# Patient Record
Sex: Male | Born: 1935 | Race: White | Hispanic: No | Smoking: Former smoker
Health system: Southern US, Community
[De-identification: ages and names within clinical notes are randomized; demographics above are authoritative.]

## PROBLEM LIST (undated history)

## (undated) DIAGNOSIS — G709 Myoneural disorder, unspecified: Secondary | ICD-10-CM

## (undated) DIAGNOSIS — N400 Enlarged prostate without lower urinary tract symptoms: Secondary | ICD-10-CM

## (undated) DIAGNOSIS — N183 Chronic kidney disease, stage 3 unspecified: Secondary | ICD-10-CM

## (undated) DIAGNOSIS — C801 Malignant (primary) neoplasm, unspecified: Secondary | ICD-10-CM

## (undated) DIAGNOSIS — Z87442 Personal history of urinary calculi: Secondary | ICD-10-CM

## (undated) DIAGNOSIS — I10 Essential (primary) hypertension: Secondary | ICD-10-CM

## (undated) DIAGNOSIS — J189 Pneumonia, unspecified organism: Secondary | ICD-10-CM

## (undated) DIAGNOSIS — N200 Calculus of kidney: Secondary | ICD-10-CM

## (undated) DIAGNOSIS — I2510 Atherosclerotic heart disease of native coronary artery without angina pectoris: Secondary | ICD-10-CM

## (undated) DIAGNOSIS — I2699 Other pulmonary embolism without acute cor pulmonale: Secondary | ICD-10-CM

## (undated) DIAGNOSIS — M1990 Unspecified osteoarthritis, unspecified site: Secondary | ICD-10-CM

## (undated) DIAGNOSIS — E785 Hyperlipidemia, unspecified: Secondary | ICD-10-CM

## (undated) HISTORY — PX: EYE SURGERY: SHX253

## (undated) HISTORY — PX: CORONARY ANGIOPLASTY: SHX604

## (undated) HISTORY — DX: Atherosclerotic heart disease of native coronary artery without angina pectoris: I25.10

## (undated) HISTORY — PX: CATARACT EXTRACTION: SUR2

## (undated) HISTORY — PX: JOINT REPLACEMENT: SHX530

## (undated) HISTORY — DX: Benign prostatic hyperplasia without lower urinary tract symptoms: N40.0

## (undated) HISTORY — DX: Hyperlipidemia, unspecified: E78.5

---
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
## 1999-03-09 HISTORY — PX: HERNIA REPAIR: SHX51

## 2002-04-08 HISTORY — PX: TRANSURETHRAL RESECTION OF PROSTATE: SHX73

## 2003-02-18 ENCOUNTER — Ambulatory Visit (HOSPITAL_COMMUNITY): Admission: RE | Admit: 2003-02-18 | Discharge: 2003-02-18 | Payer: Self-pay | Admitting: Urology

## 2003-02-22 ENCOUNTER — Ambulatory Visit (HOSPITAL_COMMUNITY): Admission: RE | Admit: 2003-02-22 | Discharge: 2003-02-22 | Payer: Self-pay | Admitting: Urology

## 2003-03-10 ENCOUNTER — Encounter (INDEPENDENT_AMBULATORY_CARE_PROVIDER_SITE_OTHER): Payer: Self-pay

## 2003-03-10 ENCOUNTER — Inpatient Hospital Stay (HOSPITAL_COMMUNITY): Admission: RE | Admit: 2003-03-10 | Discharge: 2003-03-12 | Payer: Self-pay | Admitting: Urology

## 2003-04-09 HISTORY — PX: OTHER SURGICAL HISTORY: SHX169

## 2003-04-21 ENCOUNTER — Inpatient Hospital Stay (HOSPITAL_COMMUNITY): Admission: RE | Admit: 2003-04-21 | Discharge: 2003-04-24 | Payer: Self-pay | Admitting: Urology

## 2003-04-21 ENCOUNTER — Encounter (INDEPENDENT_AMBULATORY_CARE_PROVIDER_SITE_OTHER): Payer: Self-pay | Admitting: Specialist

## 2004-04-08 HISTORY — PX: CARDIAC CATHETERIZATION: SHX172

## 2005-03-25 ENCOUNTER — Ambulatory Visit: Payer: Self-pay | Admitting: Cardiology

## 2005-03-27 ENCOUNTER — Ambulatory Visit (HOSPITAL_COMMUNITY): Admission: EM | Admit: 2005-03-27 | Discharge: 2005-03-28 | Payer: Self-pay | Admitting: Cardiology

## 2005-03-27 ENCOUNTER — Ambulatory Visit: Payer: Self-pay | Admitting: Cardiovascular Disease

## 2005-03-27 ENCOUNTER — Inpatient Hospital Stay (HOSPITAL_BASED_OUTPATIENT_CLINIC_OR_DEPARTMENT_OTHER): Admission: RE | Admit: 2005-03-27 | Discharge: 2005-03-27 | Payer: Self-pay | Admitting: Cardiovascular Disease

## 2005-04-15 ENCOUNTER — Ambulatory Visit: Payer: Self-pay | Admitting: Cardiology

## 2005-10-07 ENCOUNTER — Ambulatory Visit: Payer: Self-pay

## 2005-10-15 ENCOUNTER — Ambulatory Visit: Payer: Self-pay | Admitting: Cardiology

## 2005-10-31 ENCOUNTER — Ambulatory Visit: Payer: Self-pay

## 2005-11-25 ENCOUNTER — Inpatient Hospital Stay (HOSPITAL_COMMUNITY): Admission: RE | Admit: 2005-11-25 | Discharge: 2005-11-28 | Payer: Self-pay | Admitting: Orthopedic Surgery

## 2005-11-25 HISTORY — PX: OTHER SURGICAL HISTORY: SHX169

## 2006-03-03 ENCOUNTER — Inpatient Hospital Stay (HOSPITAL_COMMUNITY): Admission: RE | Admit: 2006-03-03 | Discharge: 2006-03-05 | Payer: Self-pay | Admitting: Orthopedic Surgery

## 2006-10-29 ENCOUNTER — Ambulatory Visit: Payer: Self-pay | Admitting: Cardiology

## 2007-10-14 ENCOUNTER — Ambulatory Visit: Payer: Self-pay | Admitting: Cardiology

## 2007-12-23 ENCOUNTER — Encounter: Payer: Self-pay | Admitting: Cardiology

## 2008-04-08 HISTORY — PX: HIP ARTHROPLASTY: SHX981

## 2008-08-22 ENCOUNTER — Encounter (INDEPENDENT_AMBULATORY_CARE_PROVIDER_SITE_OTHER): Payer: Self-pay | Admitting: *Deleted

## 2008-10-12 DIAGNOSIS — Z9889 Other specified postprocedural states: Secondary | ICD-10-CM

## 2008-10-12 DIAGNOSIS — Z9089 Acquired absence of other organs: Secondary | ICD-10-CM

## 2008-10-12 DIAGNOSIS — Z9849 Cataract extraction status, unspecified eye: Secondary | ICD-10-CM

## 2008-10-20 ENCOUNTER — Ambulatory Visit: Payer: Self-pay | Admitting: Cardiology

## 2008-10-20 DIAGNOSIS — E7800 Pure hypercholesterolemia, unspecified: Secondary | ICD-10-CM

## 2008-10-24 ENCOUNTER — Telehealth (INDEPENDENT_AMBULATORY_CARE_PROVIDER_SITE_OTHER): Payer: Self-pay | Admitting: *Deleted

## 2008-10-25 ENCOUNTER — Encounter: Payer: Self-pay | Admitting: Cardiology

## 2008-10-25 ENCOUNTER — Ambulatory Visit: Payer: Self-pay

## 2008-11-09 ENCOUNTER — Encounter: Payer: Self-pay | Admitting: Cardiology

## 2008-11-21 ENCOUNTER — Inpatient Hospital Stay (HOSPITAL_COMMUNITY): Admission: RE | Admit: 2008-11-21 | Discharge: 2008-11-23 | Payer: Self-pay | Admitting: Orthopedic Surgery

## 2009-03-09 ENCOUNTER — Encounter: Payer: Self-pay | Admitting: Cardiology

## 2009-11-03 ENCOUNTER — Ambulatory Visit: Payer: Self-pay | Admitting: Cardiology

## 2009-12-14 ENCOUNTER — Encounter: Payer: Self-pay | Admitting: Cardiology

## 2009-12-22 ENCOUNTER — Telehealth: Payer: Self-pay | Admitting: Cardiology

## 2010-05-08 NOTE — Progress Notes (Signed)
Summary: test result   Phone Note Call from Patient Call back at Home Phone [phone]   Caller: Patient Reason for Call: Talk to Nurse, Lab or Test Results Details for Reason: blood test on 9/8. Initial call taken by: Lorne Skeens,  December 22, 2009 10:27 AM  Follow-up for Phone Call        12/22/09--1030am--pt calling about last L/L drawn 9/11--would like to know if dr Jens Som wants any med changes as triglycerides are now elevated (195)--advised pt i would send results to dr Alphonsa Gin and if any changes are to be made he will notify pt--pt also states has not been exercising as much due to recent death of wife--nt Follow-up by: Ledon Snare, RN,  December 22, 2009 10:44 AM     Appended Document: test result continue same  Appended Document: test result LMTCB./CY  Appended Document: test result pt aware no changes at this time./cy

## 2010-05-08 NOTE — Assessment & Plan Note (Signed)
Summary: YEARLY/SL  Medications Added CRESTOR 40 MG TABS (ROSUVASTATIN CALCIUM) Take one tablet by mouth daily.        Primary Provider:  Dr. Welton Flakes   History of Present Illness: The patient is a pleasant gentleman who has a history of PCI of his LAD in December 2006 with a bare-metal stent. His most recent Myoview was performed in July 2010.  At that time, he was found to have no scar or ischemia.  His ejection fraction was 59%. The patient has had previous abdominal ultrasound on October 31, 2005, that showed no aneurysm. I last saw him in July of 2010. Since then he denies any dyspnea, chest pain, palpitations or syncope no pedal edema. There is no claudication. Note last LDL was 98 in December of 7425.  Current Medications (verified): 1)  Aspirin 81 Mg Tbec (Aspirin) .... Take One Tablet By Mouth Daily 2)  Crestor 20 Mg Tabs (Rosuvastatin Calcium) .Marland Kitchen.. 1 Tab Once Daily 3)  Toprol Xl 25 Mg Xr24h-Tab (Metoprolol Succinate) .... Once Daily 4)  Multivitamins   Tabs (Multiple Vitamin) .... Once Daily 5)  Lisinopril 5 Mg Tabs (Lisinopril) .Marland Kitchen.. 1 Tab Once Daily 6)  Coq10 100 Mg Caps (Coenzyme Q10) .Marland Kitchen.. 1 Cap Once Daily 7)  Tylenol 325 Mg Tabs (Acetaminophen) .... As Needed 8)  Trigosamine .Marland Kitchen.. 1-2 Tabs Once Daily  Allergies: No Known Drug Allergies  Past History:  Past Medical History: Reviewed history from 10/20/2008 and no changes required. CAD with hx of 2 sstents performed Dec.20,2006 hyperlipidemia Benign prostatic hypertrophy  Past Surgical History: Reviewed history from 10/12/2008 and no changes required.   Appendectomy in 1938.   Herniorrhaphy December 2000.   Cataract removal January 2001 and 2004.   TURP in 2004.   Left nephrectomy January 2005.   Left total knee arthroplasty November 25, 2005.  Social History: Reviewed history from 10/12/2008 and no changes required. Retired Scientist, clinical (histocompatibility and immunogenetics). Married  lives with his wife has two children,son 41 yrs  and  daugther 43 yrs both  alive   Tobacco Use - No.  Alcohol Use - yes one or two beers Drug Use - no  Review of Systems       Arthralgias but no fevers or chills, productive cough, hemoptysis, dysphasia, odynophagia, melena, hematochezia, dysuria, hematuria, rash, seizure activity, orthopnea, PND, pedal edema, claudication. Remaining systems are negative.   Vital Signs:  Patient profile:   77 year old male Height:      71 inches Weight:      189 pounds BMI:     26.46 Pulse rate:   62 / minute Resp:     12 per minute BP sitting:   118 / 72  (left arm)  Vitals Entered By: Kem Parkinson (November 03, 2009 9:47 AM)  Physical Exam  General:  Well-developed well-nourished in no acute distress.  Skin is warm and dry.  HEENT is normal.  Neck is supple. No thyromegaly.  Chest is clear to auscultation with normal expansion.  Cardiovascular exam is regular rate and rhythm.  Abdominal exam nontender or distended. No masses palpated. Extremities show no edema. neuro grossly intact    EKG  Procedure date:  11/03/2009  Findings:      Sinus rhythm at a rate of 62. No ST changes.  Impression & Recommendations:  Problem # 1:  HYPERCHOLESTEROLEMIA-PURE (ICD-272.0) Recent LDL not at goal. Increase Crestor 40 mg p.o. daily. Check lipids and liver in 6 weeks. His updated medication list  for this problem includes:    Crestor 40 Mg Tabs (Rosuvastatin calcium) .Marland Kitchen... Take one tablet by mouth daily.  His updated medication list for this problem includes:    Crestor 20 Mg Tabs (Rosuvastatin calcium) .Marland Kitchen... 1 tab once daily  Problem # 2:  CORONARY ATHEROSCLEROSIS NATIVE CORONARY ARTERY (ICD-414.01)  Continue aspirin, beta blocker, ACE inhibitor and statin. His updated medication list for this problem includes:    Aspirin 81 Mg Tbec (Aspirin) .Marland Kitchen... Take one tablet by mouth daily    Toprol Xl 25 Mg Xr24h-tab (Metoprolol succinate) ..... Once daily    Lisinopril 5 Mg Tabs (Lisinopril) .Marland Kitchen... 1  tab once daily  His updated medication list for this problem includes:    Aspirin 81 Mg Tbec (Aspirin) .Marland Kitchen... Take one tablet by mouth daily    Toprol Xl 25 Mg Xr24h-tab (Metoprolol succinate) ..... Once daily    Lisinopril 5 Mg Tabs (Lisinopril) .Marland Kitchen... 1 tab once daily  Patient Instructions: 1)  Your physician recommends that you schedule a follow-up appointment in: ONE YEAR 2)  Your physician recommends that you return for lab work in:6 WEEKS-WEEKS OF 12-12-09 3)  Your physician has recommended you make the following change in your medication: INCREASE CRESTOR 40MG  ONCE DAILY Prescriptions: CRESTOR 40 MG TABS (ROSUVASTATIN CALCIUM) Take one tablet by mouth daily.  #30 x 12   Entered by:   Deliah Goody, RN   Authorized by:   Ferman Hamming, MD, [facility]   Signed by:   Deliah Goody, RN on 11/03/2009   Method used:   Electronically to        [facility]  [id number] (retail)       [address]       [place], [place]  [address]       Ph: [phone] or [phone]       Fax: [phone]   RxID:   [id number]

## 2010-07-14 LAB — CBC
HCT: 25.6 % — ABNORMAL LOW (ref 39.0–52.0)
HCT: 28.6 % — ABNORMAL LOW (ref 39.0–52.0)
HCT: 40.9 % (ref 39.0–52.0)
Hemoglobin: 8.8 g/dL — ABNORMAL LOW (ref 13.0–17.0)
Hemoglobin: 9.7 g/dL — ABNORMAL LOW (ref 13.0–17.0)
Hemoglobin: 14 g/dL (ref 13.0–17.0)
MCHC: 33.9 g/dL (ref 30.0–36.0)
MCHC: 34.4 g/dL (ref 30.0–36.0)
MCHC: 34.2 g/dL (ref 30.0–36.0)
MCV: 95.3 fL (ref 78.0–100.0)
MCV: 97.6 fL (ref 78.0–100.0)
MCV: 96.4 fL (ref 78.0–100.0)
Platelets: 129 10*3/uL — ABNORMAL LOW (ref 150–400)
Platelets: 139 10*3/uL — ABNORMAL LOW (ref 150–400)
Platelets: 231 10*3/uL (ref 150–400)
RBC: 2.63 MIL/uL — ABNORMAL LOW (ref 4.22–5.81)
RBC: 3 MIL/uL — ABNORMAL LOW (ref 4.22–5.81)
RBC: 4.24 MIL/uL (ref 4.22–5.81)
RDW: 13.3 % (ref 11.5–15.5)
RDW: 13.5 % (ref 11.5–15.5)
RDW: 13.1 % (ref 11.5–15.5)
WBC: 10.7 10*3/uL — ABNORMAL HIGH (ref 4.0–10.5)
WBC: 8.3 10*3/uL (ref 4.0–10.5)
WBC: 9.2 10*3/uL (ref 4.0–10.5)

## 2010-07-14 LAB — COMPREHENSIVE METABOLIC PANEL
ALT: 15 U/L (ref 0–53)
AST: 24 U/L (ref 0–37)
Albumin: 4.3 g/dL (ref 3.5–5.2)
Alkaline Phosphatase: 51 U/L (ref 39–117)
BUN: 21 mg/dL (ref 6–23)
CO2: 28 mEq/L (ref 19–32)
Calcium: 10 mg/dL (ref 8.4–10.5)
Chloride: 105 mEq/L (ref 96–112)
Creatinine, Ser: 1.21 mg/dL (ref 0.4–1.5)
GFR calc Af Amer: >60 mL/min (ref >60)
GFR calc non Af Amer: 59 mL/min — ABNORMAL LOW (ref >60)
Glucose, Bld: 87 mg/dL (ref 70–99)
Potassium: 4.8 mEq/L (ref 3.5–5.1)
Sodium: 139 mEq/L (ref 135–145)
Total Bilirubin: 1.3 mg/dL — ABNORMAL HIGH (ref 0.3–1.2)
Total Protein: 6.9 g/dL (ref 6.0–8.3)

## 2010-07-14 LAB — URINALYSIS, ROUTINE W REFLEX MICROSCOPIC
Bilirubin Urine: NEGATIVE
Glucose, UA: NEGATIVE mg/dL
Hgb urine dipstick: NEGATIVE
Ketones, ur: NEGATIVE mg/dL
Nitrite: NEGATIVE
Protein, ur: NEGATIVE mg/dL
Specific Gravity, Urine: 1.02 (ref 1.005–1.030)
Urobilinogen, UA: 0.2 mg/dL (ref 0.0–1.0)
pH: 5.5 (ref 5.0–8.0)

## 2010-07-14 LAB — DIFFERENTIAL
Basophils Absolute: 0.1 10*3/uL (ref 0.0–0.1)
Basophils Relative: 1 % (ref 0–1)
Eosinophils Absolute: 0.2 10*3/uL (ref 0.0–0.7)
Eosinophils Relative: 2 % (ref 0–5)
Lymphocytes Relative: 24 % (ref 12–46)
Lymphs Abs: 2.2 10*3/uL (ref 0.7–4.0)
Monocytes Absolute: 0.9 10*3/uL (ref 0.1–1.0)
Monocytes Relative: 9 % (ref 3–12)
Neutro Abs: 5.9 10*3/uL (ref 1.7–7.7)
Neutrophils Relative %: 64 % (ref 43–77)

## 2010-07-14 LAB — BASIC METABOLIC PANEL
BUN: 12 mg/dL (ref 6–23)
BUN: 13 mg/dL (ref 6–23)
CO2: 28 mEq/L (ref 19–32)
CO2: 29 mEq/L (ref 19–32)
Calcium: 8.2 mg/dL — ABNORMAL LOW (ref 8.4–10.5)
Calcium: 8.5 mg/dL (ref 8.4–10.5)
Chloride: 103 mEq/L (ref 96–112)
Chloride: 103 mEq/L (ref 96–112)
Creatinine, Ser: 1.28 mg/dL (ref 0.4–1.5)
Creatinine, Ser: 1.38 mg/dL (ref 0.4–1.5)
GFR calc Af Amer: >60 mL/min (ref >60)
GFR calc Af Amer: >60 mL/min (ref >60)
GFR calc non Af Amer: 50 mL/min — ABNORMAL LOW (ref >60)
GFR calc non Af Amer: 55 mL/min — ABNORMAL LOW (ref >60)
Glucose, Bld: 119 mg/dL — ABNORMAL HIGH (ref 70–99)
Glucose, Bld: 132 mg/dL — ABNORMAL HIGH (ref 70–99)
Potassium: 4.2 mEq/L (ref 3.5–5.1)
Potassium: 4.5 mEq/L (ref 3.5–5.1)
Sodium: 136 mEq/L (ref 135–145)
Sodium: 136 mEq/L (ref 135–145)

## 2010-07-14 LAB — POCT I-STAT 4, (NA,K, GLUC, HGB,HCT)
Glucose, Bld: 124 mg/dL — ABNORMAL HIGH (ref 70–99)
HCT: 31 % — ABNORMAL LOW (ref 39.0–52.0)
Hemoglobin: 10.5 g/dL — ABNORMAL LOW (ref 13.0–17.0)
Potassium: 4.2 mEq/L (ref 3.5–5.1)
Sodium: 135 mEq/L (ref 135–145)

## 2010-07-14 LAB — APTT: aPTT: 30 seconds (ref 24–37)

## 2010-07-14 LAB — CROSSMATCH
ABO/RH(D): O NEG
Antibody Screen: NEGATIVE

## 2010-07-14 LAB — URINE CULTURE: Colony Count: 2000

## 2010-07-14 LAB — PROTIME-INR
INR: 0.9 (ref 0.00–1.49)
Prothrombin Time: 12.2 seconds (ref 11.6–15.2)

## 2010-07-31 ENCOUNTER — Telehealth: Payer: Self-pay | Admitting: Cardiology

## 2010-07-31 ENCOUNTER — Encounter: Payer: Self-pay | Admitting: Cardiology

## 2010-07-31 NOTE — Telephone Encounter (Signed)
error 

## 2010-07-31 NOTE — Telephone Encounter (Signed)
Spoke with pt, okay given for pt to take fish oil Deliah Goody

## 2010-08-21 NOTE — Op Note (Signed)
NAMEPHILLIPPE, Ricardo Soto         ACCOUNT NO.:  [id number]   MEDICAL RECORD NO.:  [id number]          PATIENT TYPE:  INP   LOCATION:  5006                         FACILITY:  [facility]   PHYSICIAN:  Mila Homer. Sherlean Foot, M.D. DATE OF BIRTH:  20-Oct-1931   DATE OF PROCEDURE:  11/21/2008  DATE OF DISCHARGE:                               OPERATIVE REPORT   SURGEON:  Mila Homer. Sherlean Foot, MD   ASSISTANT:  1. Altamese Cabal, PA-C  2. Skip Mayer, PA-C   ANESTHESIA:  General.   PREOPERATIVE DIAGNOSIS:  Left hip osteoarthritis.   POSTOPERATIVE DIAGNOSIS:  Left hip osteoarthritis.   PROCEDURE:  Left total hip arthroplasty.   INDICATIONS FOR PROCEDURE:  The patient is a 77 year old white male who  had failed conservative measures for osteoarthritis of the left hip.  Informed consent was obtained.   DESCRIPTION OF PROCEDURE:  The patient was laid supine and administered  general anesthesia and Foley catheter was placed.  He was then placed in  the right down left up lateral decubitus position.  Left knee was  prepped and draped in usual sterile fashion.  Curvilinear incision was  made over the trochanter with a #10 blade approximately 6 inches in  length.  I then cut the fascia lata with the cautery along the same in  incision.  I then placed a Charnley retractor in place.  I then elevated  a sleeve of vastus lateralis medius and gluteus medius and gluteus  minimus, bisecting the lateralis medius, and tagging with stay sutures.  I then placed a Hohmann retractor protecting these anteriorly over the  front edge of the acetabulum.  I then performed an anterior hip  capsulectomy with cautery and the pickups.  At this point, I then went  in the external rotation with the hip and marked out the neck cut.  I  made the neck cut with reciprocating saw.  I removed that head and neck  segment.  I then switched sides with my PA.  I went to the front side of  the patient.  I then circumferentially cleaned  out the acetabulum labrum  with Hohmann retractors in place.  I then reamed sequentially up to 58  and put in a 60-mm fiber mesh cup with 3 holes and drilled one drill  hole just for some added stability into the bone with a little saw.  I  then snapped in a 32-mm liner, standard.  I then went to the backside of  the table and flexed the leg into a sterile pouch.  Hip flex and knee  flex to 90 degrees.  I placed a  Mueller retractor and taken the soft tissues and then used a canal  finder and then reamed up to 15 mm, versed 15 mm and then trialed in 0,  gave me excellent stability.  I then removed the trial components and  then tamped down a fully porous coated stem, placed a 32, +0 head on a  clean Morse taper, took it through aggressive range of motion and he did  very very well.  I then closed the lateralis medius minimus sleeve  through drill  holes and oversewn with a  #2 Tevdek.  I then closed the fascia lata with #1 Vicryl running  sutures.  I then closed the soft tissues with buried 0 Vicryl sutures.  I then ran a 2-0 Vicryl subcuticular and skin staples.  I then dressed  with a Mepilex dressing.  EBL 300 mL.  Complications none.  Drains none.           ______________________________  Mila Homer. Sherlean Foot, M.D.     SDL/MEDQ  D:  11/21/2008  T:  11/22/2008  Job:  [id number]

## 2010-08-21 NOTE — Assessment & Plan Note (Signed)
[facility]                            CARDIOLOGY OFFICE NOTE   Ricardo Soto, Ricardo Soto                MRN:          [id number]  DATE:10/14/2007                            DOB:          24-Jun-1931    HISTORY OF PRESENT ILLNESS:  The patient is a pleasant gentleman who has  a history of PCI of his LAD in December 2006 with a bare-metal stent.  His most recent Myoview was performed on October 07, 2005.  At that time, he  was found to have no scar or ischemia.  His ejection fraction was 55%.  The patient has had previous abdominal ultrasound on October 31, 2005, that  showed no aneurysm.  Since I last saw him, he denies any dyspnea, chest  pain, palpitations, or syncope.  However, he does complain of weakness  in his lower extremities and cramping.  This is temporarily related to  starting Lipitor.  Lipitor was substituted for Vytorin.  His Vytorin  apparently was causing loose bowels.   MEDICATIONS:  1. Aspirin 81 mg p.o. daily.  2. Toprol 25 mg p.o. daily.  3. Multivitamin.  4. Lipitor daily 40 mg p.o. daily.   PHYSICAL EXAMINATION:  VITAL SIGNS:  Blood pressure of 120/70s and pulse  is 61.  He weighs 193 pounds.  HEENT:  Normal.  NECK:  Supple.  CHEST:  Clear.  CARDIOVASCULAR:  Regular rate and rhythm.  ABDOMEN:  No pulsatile mass.  No bruit.  EXTREMITIES:  No edema.  Note, he does have 2+ posterior tibial pulses  bilaterally.   His electrocardiogram shows a sinus rhythm at a rate of 61.  No ST  changes noted.   DIAGNOSES:  1. Coronary artery disease status post bare-metal stent to the left      anterior descending artery in December 2006 - He will continue his      aspirin and beta-blocker.  Note, he has not had chest pain or      shortness of breath.  He will continue his risk factor modification      including diet and exercise.  He does not smoke.  In 1 year when he      returns, he will most likely repeat his Myoview.  2. Hyperlipidemia  - The patient is complaining of bilateral lower      extremity pain.  This sounds to be potentially myalgias from his      Lipitor.  I will discontinue that medication for 4 weeks.  If his      symptoms improve, we will then try Crestor 20 mg take daily, and      check lipids and liver in 6 weeks.  If his symptoms do not improve      off the Lipitor, then we will check ABIs to screen for peripheral      vascular disease, although he does have good pulses on      examination.  3. Remote history of tobacco use.     Madolyn Frieze Jens Som, MD, [facility]  Electronically Signed    BSC/MedQ  DD: 10/14/2007  DT: 10/15/2007  Job #: [id number]  cc:   Dr.Jaber Crista Luria

## 2010-08-21 NOTE — Assessment & Plan Note (Signed)
 [facility]                            CARDIOLOGY OFFICE NOTE   Ricardo, Soto                MRN:          [id number]  DATE:10/29/2006                            DOB:          [birth year]    The patient is a very pleasant gentleman who is status post bare metal  stent to his LAD in December of 2006.  A followup nuclear study was  performed on October 07, 2005.  At that time, he was found to have no sign  of scar or ischemia, and his ejection fraction was 55%.  Also of note  that he had an abdominal ultrasound on October 31, 2005 that showed no  aneurysm.  Since I last saw him, he is doing extremely well.  There is  no dyspnea on exertion, orthopnea, PND, palpitations, syncope, or chest  pain.  He did have bilateral knee replacements.   MEDICATIONS:  1. Aspirin 81 mg p.o. daily.  2. Plavix 75 mg p.o. daily.  3. Vytorin 10/40 daily.  4. Metoprolol ER 25 mg p.o. daily.  5. Multivitamin.   PHYSICAL EXAM:  Blood pressure 138/80, pulse 75.  He weighs 193 pounds.  HEENT:  Normal.  NECK:  Supple with no bruits.  CHEST:  Clear.  CARDIOVASCULAR:  Regular rate and rhythm.  ABDOMEN:  Benign.  EXTREMITIES:  No edema.   ELECTROCARDIOGRAM:  Shows his sinus rhythm at a rate of 70.  There are  no ST changes noted.   DIAGNOSES:  1. Coronary artery disease status post non-drug-eluting stent to the      left anterior descending in December 2006 - we will continue with      medical therapy including his aspirin, beta blocker, and Vytorin.      I will discontinue his Plavix as it has now been greater than 1      year since his intervention.  Note, he has had no chest pain or      shortness of breath.  2. Hyperlipidemia - we will have his most recent lipids and liver      forwarded to [place] from Dr. Milta Deiters office.  Our goal LDL should be      less than 70.  3. Remote tobacco abuse.   I will see him back in 1 year or sooner if necessary.     Ricardo Soto  Ricardo Som, MD, [facility]  Electronically Signed    BSC/MedQ  DD: 10/29/2006  DT: 10/29/2006  Job #: [id number]   cc:   Tomasa Rand, MD

## 2010-08-24 NOTE — Assessment & Plan Note (Signed)
[facility]                              CARDIOLOGY OFFICE NOTE   Ricardo Soto, Ricardo Soto                MRN:          [id number]  DATE:10/15/2005                            DOB:          03-09-1932    REFERRING PHYSICIAN:  BRIAN S Soto   HISTORY OF PRESENT ILLNESS:  The patient is a pleasant 77 year old male who  has previously been followed by Dr. Andee Soto.  A previous nuclear study showed  apical ischemia.  He underwent cardiac catheterization on March 27, 2005  and was found to have a subtotaled LAD after the take-off of the first  diagonal.  There were right to left septal collaterals.  The patient  subsequently underwent PCI of his LAD with a non drug-eluting stent.  He did  have a follow up nuclear study on October 07, 2005 that showed normal left  ventricular function with an ejection fraction of 55%.  No perfusion  abnormalities.  Since then, he has done well.  There is no dyspnea on  exertion, orthopnea, PND, pedal edema, palpitations, presyncope, syncope or  exertional chest pain.  He does complain of pain in his knees due to  osteoarthritis, and may require knee replacement.   MEDICATIONS:  1.  Vytorin 10/20 q.h.s.  2.  Toprol 25 mg p.o. daily.  3.  Aspirin 81 mg p.o. daily.  4.  Plavix 75 mg p.o. daily.   PHYSICAL EXAMINATION:  VITAL SIGNS:  Blood pressure of 130/73, pulse 64.  NECK:  Supple.  No bruits.  CHEST:  Clear.  CARDIAC:  Regular rate and rhythm.  ABDOMEN:  No pulsatile mass, but there is a soft bruit.  EXTREMITIES:  No edema.   ELECTROCARDIOGRAM:  Electrocardiogram shows a sinus rhythm at a rate of 64.  The axis is normal.  There are no ST changes noted.   DIAGNOSES:  1.  Coronary artery disease status post non drug-eluting stent to the left      anterior descending in December of 2006.  2.  Hyperlipidemia.  3.  Remote tobacco abuse.   PLAN:  Ricardo Soto is doing well from a symptomatic standpoint.  We  will  continue with medical therapy.  He is scheduled to have lipids and liver  drawn in [place], and we will have those forwarded to [place] for our records.  Our goal LDL should be less than 70, given his history of coronary disease.  Given his recent negative Myoview, I think he would be at low risk for  surgery if he requires knee replacement.  Note - he does not smoke, and he  is exercising  routinely.  He is also following a diet.  We will plan to proceed with an  abdominal ultrasound to exclude aneurysm, given his soft bruit.  I will see  him back in 12 months.                              Ricardo Soto Ricardo Som, MD, [facility]    BSC/MedQ  DD:  10/15/2005  DT:  10/15/2005  Job #:  [id number]   cc:   Ricardo Kitchens, MD

## 2010-08-24 NOTE — Op Note (Signed)
NAME:  Ricardo Soto, Ricardo Soto                   ACCOUNT NO.:  [id number]   MEDICAL RECORD NO.:  [id number]                   PATIENT TYPE:  INP   LOCATION:  0001                                 FACILITY:  [facility]   PHYSICIAN:  Excell Seltzer. Annabell Howells, M.D.                 DATE OF BIRTH:  November 29, 1931   DATE OF PROCEDURE:  04/21/2003  DATE OF DISCHARGE:                                 OPERATIVE REPORT   PREOPERATIVE DIAGNOSIS:  Left renal mass.   POSTOPERATIVE DIAGNOSIS:  Left renal mass.   PROCEDURE:  Left hand-assisted laparoscopic radical nephrectomy.   SURGEON:  Excell Seltzer. Annabell Howells, M.D.   ASSISTANT:  Bertram Millard. Dahlstedt, M.D.   ANESTHESIA:  General.   SPECIMENS:  Left kidney.   DRAINS:  Foley catheter.   COMPLICATIONS:  None.   INDICATIONS:  Mr. Mcquiston is a 77 year old white male who was found on  evaluation for microscopic hematuria to have an intraparenchymal left lower  pole renal mass suspicious for renal cell carcinoma.  Due to the perihilar  intraparenchymal location of the lesion, it was not felt to be a candidate  for partial nephrectomy.  The patient has elected a hand-assisted  laparoscopic radical nephrectomy.   FINDINGS AND PROCEDURE:  The patient was given 1 g of Ancef.  He was fitted  with thigh-high TED and PAS hose.  He was taken to the operating room, where  a general anesthetic was induced, a Foley catheter was placed under sterile  conditions.  He was on a bean bag and his left flank was bumped.  All  pressure points were padded.  His abdomen was prepped with Betadine solution  and he was draped in the usual sterile fashion.  A supraumbilical incision  was made approximately 5 cm in length.  The fat and fascia were incised with  the Bovie.  The peritoneum was opened with Metzenbaums and the hand was  placed into the abdominal cavity to ensure appropriate sizing of the  incision.  Once the incision was felt to be appropriately generous, a hand  port was placed  using lap disk.  A 12 mm trocar and the 0 degree lens were  then passed through the hand port and a 12 mm port site was placed in the  subxiphoid region just lateral to the midline on the left.  The camera was  then repositioned to this port and a second 12 mm port was placed about 8 cm  lateral to the midline at the level of the hand port.  Once the working port  was in position, the left hand was placed through the hand port and Harmonic  scissors were placed through the working port.  The 30 degree lens was then  placed.  Initially the colon was reflected medially by taking down the white  line of Toldt.  The splenorenal ligaments were taken down as well, freeing  up the upper pole of the kidney  from the spleen.  The colon and its  mesentery were then reflected off the anterior aspect of the kidney.  The  inferior pole of the kidney was dissected out using blunt dissection and the  Harmonic scissors.  Eventually the ureter was identified.  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  This was managed with  large Hemoloc clips, one for each of the distal branches of the ureter and a  single for the kidney side.  At this point the dissection was carried up  into the hilum, where the vein was identified and some additional lower pole  hilar attachments were taken down.  At this point there was a little oozing  from the hilum.  A pack was placed in this area and we turned our attention  to the upper pole of the kidney which was then freed up, allowing placement  of the dissecting hand behind the kidney.  With the hand behind the kidney,  the hilum was palpated, the artery was identified.  It was then dissected  out using the Harmonic scissors and the suction irrigator.  Once the artery  was identified, it was noted to be closely applied to the posterior aspect  of the vein.  It was felt that management of the hilar vessels as a single  packet was indicated.  The  Endo-GIA was then passed through the working port  and placed across the vessels.  Once adequate placement was ensured, the  device was closed and fired, released and then removed.  No bleeding from  the hilum was noted.  At this point some additional lateral and posterior  attachments were taken down, and the specimen was removed from the abdomen.  Inspection of the renal fossa revealed no bleeding from the area of the  hilum or the area of the lower pole dissection.  There was some minor oozing  from the upper pole, a small amount of residual Gerota's fascia at the upper  pole.  This was packed with Surgicel and observed, and the oozing ceased.  At this point the pneumoperitoneum was released partially and the area of  dissection was reinspected, and no bleeding was noted.  At this point the  sponge was removed and the 12 mm trocars were removed under visual  inspection.  Once the 12 mm trocars were removed, the hand port was removed.  The 12 mm trocar sites were closed with 0 Vicryl suture and staples.  The  midline incision was closed with a running #1 PDS and staples.  Dressings  were applied to the wound.  At the end of the procedure the patient was  noted to have some scrotal emphysema from tracking the pneumoperitoneum, but  no other abnormalities were noted.  His anesthetic was reversed.  He was  moved to the recovery room in stable condition.  There were no  complications.  Sponge, instrument, and needle counts were correct.  The  specimen was sent for permanent section.                                               Excell Seltzer. Annabell Howells, M.D.    JJW/MEDQ  D:  04/21/2003  T:  04/21/2003  Job:  [id number]   cc:   Rosalita Levan, [place] Dr. Flonnie Overman [facility]

## 2010-08-24 NOTE — Discharge Summary (Signed)
NAME:  Ricardo Soto, Ricardo Soto                   ACCOUNT NO.:  [id number]   MEDICAL RECORD NO.:  [id number]                   PATIENT TYPE:  INP   LOCATION:  0383                                 FACILITY:  [facility]   PHYSICIAN:  Excell Seltzer. Annabell Howells, M.D.                 DATE OF BIRTH:  09/27/1931   DATE OF ADMISSION:  04/21/2003  DATE OF DISCHARGE:  04/24/2003                                 DISCHARGE SUMMARY   Briefly, Mr. Breighner is a 77 year old white male who was recently  evaluated for hematuria. He was found to have multiple problems including a  left renal mass suspicious for renal cell carcinoma. He also had been found  to have BPH with bladder outlet obstruction, bladder distention, and  previously undergone TURP.  There was also a 2.3 cm adrenal adenopathy on  the right for which he had been evaluated by Dr. Ezzie Dural.  His medical history  was otherwise remarkable for a history of BPH with bladder outlet  obstruction, history of kidney stones, and arthritis.   Surgical history is significant for cataract extraction left eye, right  inguinal hernia, and appendectomy in addition to the TURP and bladder stone  removal. For additional details of the history and physical, please see the  health history assessment sheet for clinical information. 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   PREOPERATIVE LABORATORY:  Hemoglobin 14.1, hematocrit 44.1. Chemistry within  normal limits. Blood type O negative. Antibody screen negative.   HOSPITAL COURSE:  On the day of admission the patient was taken to the  operating room where he underwent an uneventful left video-assisted  laparoscopic radical nephrectomy. He was left with a Foley catheter  postoperatively. On the first postoperative day, he was doing well without  major complaints. He did have some mild nausea on the initial day of  surgery. He was afebrile. His hemoglobin was 11.8, creatinine 1.6, glucose  122. He had been started on clear  liquids, was continued on those, and was  given a Dulcolax suppository.  Ambulation was encouraged. His oxygen was  discontinued. On the afternoon of the first postoperative day, he was  passing flatus, his abdomen was soft and moderately distended, and he was  kept on clear liquids. On the 2nd postoperative day, he was passing a little  bit of flatus, his temperature was 100.6, he had bowel sounds, and his wound  dressings were dry and intact.  His diet was advanced and he was given  another Dulcolax suppository and exchanged to p.o. pain medications.  On the  third positive day, he was doing well, had a bowel movement, his abdomen was  soft, his wounds were intact, he was afebrile, and was felt to be ready for  discharge home.   FINAL DIAGNOSES:  Left renal oncocytoma. There were no complications during  his admission.   DISCHARGE MEDICATIONS:  Vicodin one to two p.o. q.4-6h. p.r.n. pain.   He is instructed  to follow up with me on April 28, 2003.   DISPOSITION:  To home.   CONDITION:  Improved.   PROGNOSIS:  Good.                                               Excell Seltzer. Annabell Howells, M.D.    JJW/MEDQ  D:  05/12/2003  T:  05/12/2003  Job:  [id number]

## 2010-08-24 NOTE — H&P (Signed)
NAME:  NHIA, HEAPHY NO.:  [id number]   MEDICAL RECORD NO.:  [id number]           PATIENT TYPE:   LOCATION:                                 FACILITY:   PHYSICIAN:  Mila Homer. Sherlean Foot, M.D. DATE OF BIRTH:  [birth year]   DATE OF ADMISSION:  03/03/2006  DATE OF DISCHARGE:                                HISTORY & PHYSICAL   CHIEF COMPLAINT:  Right knee pain for the last 5-10 years.   HISTORY OF PRESENT ILLNESS:  The patient is a 77 year old male who underwent  a left total knee arthroplasty by Dr. Sherlean Foot on November 25, 2005.  The patient  returns today prior to his upcoming right total knee arthroplasty.  The  patient has undergone no prior surgery and had no injuries to the right  knee.  The pain is described as a constant, achy stiffness with any  weightbearing activities.  The patient currently takes Ultram for the pain.  This does help to some degree.  Mechanical symptoms positive for giving way.  The patient has undergone conservative treatment which he has failed, and  this included cortisone and Synvisc injections.  Uses no assistive devices  to ambulate.   ALLERGIES:  NO KNOWN DRUG ALLERGIES.   CURRENT MEDICATIONS:  1. Aspirin 81 mg 1 p.o. q. a.m., stopped February 20, 2006.  2. Plavix 75 mg 1 tablet p.o. q. a.m., stopped February 20, 2006.  3. Vytorin 10/40 mg 1 tab p.o. q. a.m.  4. Toprol 25 mg 1 tab p.o. q. a.m.  5. Tramadol 50 mg 1 tab p.o. q.6 hours p.r.n. pain.  6. Multivitamin with minerals 1 tab p.o. q. a.m., stopped February 25, 2006.  7. Tylenol 500 mg 1-2 tabs p.o. q.4-6 hours for pain.   PAST MEDICAL HISTORY:  1. Hypercholesterolemia.  2. BPH.  3. Coronary artery disease with history of 2 stents performed March 27, 2005.  4. 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  5. History of pneumonia as a child.   PAST SURGICAL HISTORY:  1. Appendectomy in 1938.  2.  Herniorrhaphy December 2000.  3. Cataract removal January 2001 and 2004.  4. TURP in 2004.  5. Left nephrectomy January 2005.  6. Angioplasty December 2006.  7. Left total knee arthroplasty November 25, 2005.   The only complication with the above procedures was bradycardia  postoperatively after the left total knee arthroplasty.  The patient was  transferred to telemetry postop, and there was no recorded events on  telemetry.   SOCIAL HISTORY:  The patient does have a history of 25-pack years of  smoking.  However, he quit smoking in 1984.  Drinks 1-2 beers approximately  3 times per week.  Denies any drug use.  He is married.  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  He lives with his wife in a trilevel  home.  Primary care physician is Dr. Corinna Lines at [facility],  and  his cardiologist, Dr. Jens Som, [facility].   FAMILY HISTORY:  Unchanged from previous history and physical.   REVIEW OF SYSTEMS:  The patient denies any recent cold, fever, or flu-like  symptoms.  He wears glasses for reading.  He has undergone cataract removal  twice.  Denies any shortness of breath or chest pain.  Remote history of  pneumonia.  Again, solitary right kidney status post nephrectomy on the  left.  All other systems are negative or noncontributory.   PHYSICAL EXAMINATION:  GENERAL:  The patient is a well-developed, well-  nourished white male in no acute distress.  Talks easily with examiner.  The  patient's mood and affect are appropriate.  Weight is 180 pounds.  Height is  5 feet 11 inches.  VITAL SIGNS:  Temperature 98.4 degrees, pulse 70, respiratory rate 14, blood  pressure 112/72.  CARDIAC:  Regular rate and rhythm, no murmur, rubs, or gallops noted.  RESPIRATORY:  Lungs are clear to auscultation bilaterally without wheezing,  rhonchi, or rales.  ABDOMEN:  Soft, nontender.  Bowel sounds x4 quadrants.  Does have a slight  ventral hernia.   BREAST/GENITOURINARY/RECTAL:  Exams are all deferred at this time.  HEENT:  Head is normocephalic, atraumatic without cone of light glare.  He  has sinus tenderness to palpation.  Sclerae is nonicteric.  PERRLA.  EOMs  are intact.  There are no visible external ear deformities.  TMs pearly and  gray bilaterally.  Nose:  Nasal septum midline, nasal mucosa is without  polyps.  The patient has good dentition.  The pharynx without erythema and  exudate.  Tongue and uvula are midline.  NECK:  The patient has full range of motion of the cervical spine without  pain, no lymphadenopathy.  Carotids are 2+ bilaterally without bruits.  NEUROLOGIC:  The patient is alert and oriented x3.  Cranial nerves II-XII  grossly intact.  Strength test in lower extremities reveals bilateral 5/5  strength both lower extremities throughout except for flexion of the right  hip against resistance which is 4/5.  The deep tendon reflexes to the knees  and ankles 1+ bilaterally.  He has good sensation to light touch toes  throughout.  MUSCULOSKELETAL:  Upper extremities equal and symmetric in size and shape.  He has full range of motion of the upper extremities without pain.  Radial  pulses are 2+.  He has good sensation in the tips of the fingers.  LOWER EXTREMITIES:  Full range of motion of both hips.  Full flexion to 90  degrees both hips.  Right knee has full extension, flexion is 120 degrees.  Five degrees of varus deformity.  Passive range of motion reveals moderate  crepitus.  No effusion, no edema.  Valgus/varus stressing reveals no laxity.  Left knee:  A well-healed postsurgical incision.  He has full extension and  flexion to 122 degrees.  No effusion, no edema.  Valgus/varus stressing  reveals no laxity.  Calves bilaterally are nontender and supple.  Posterior  tibial pulses are 2+ bilaterally and equal and symmetric.  X-RAYS:  X-rays of the right knee show medial compartment collapse almost  completely.    IMPRESSION:  1. End-stage osteoarthritis right knee.  2. Status post left total knee arthroplasty, November 25, 2005, doing well.  3. History of coronary artery disease with a history of stent.  4. Hypercholesterolemia.  5. Benign prostatic hypertrophy.  6. 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  7. History  of transurethral resection of prostate procedure, December      2004.  8. Bradycardia status post left total knee arthroplasty, November 25, 2005.   PLAN:  The patient is to be admitted to [facility] in March 03, 2006, to undergo right total knee arthroplasty by Dr. Sherlean Foot.  Prior to  surgery, the patient will undergo preoperative labs and testing.      [name], P.A.    ______________________________  Mila Homer. Sherlean Foot, M.D.    GC/MEDQ  D:  02/25/2006  T:  02/25/2006  Job:  [id number]

## 2010-08-24 NOTE — Cardiovascular Report (Signed)
NAME:  Ricardo Soto, Ricardo Soto         ACCOUNT NO.:  [id number]   MEDICAL RECORD NO.:  [id number]          PATIENT TYPE:  OIB   LOCATION:  6526                         FACILITY:  [facility]   PHYSICIAN:  Arturo Morton. Riley Kill, M.D. [facility] OF BIRTH:  03-06-1932   DATE OF PROCEDURE:  03/27/2005  DATE OF DISCHARGE:  03/28/2005                              CARDIAC CATHETERIZATION   INDICATIONS:  Ricardo Soto is a 77 year old gentleman who was just after  Thanksgiving suffered substernal chest pain. He subsequently underwent a  Cardiolite study compatible with ischemia. He underwent diagnostic  catheterization by Dr. Eden Emms demonstrating a subtotal occlusion of the left  anterior descending artery. The options were discussed with the patient and  his wife in detail. We discussed the possibility of medical therapy and/or  attempted percutaneous intervention. The patient desired after discussion to  proceed with attempted percutaneous intervention. He was given a 300  milligrams load of oral clopidogrel downstairs by Dr. Eden Emms and also  started on intravenous heparin. He subsequently was referred into the  interventional laboratory.   PROCEDURE:  Percutaneous stenting of the left anterior descending artery.   DESCRIPTION OF PROCEDURE:  The patient was brought to the cath lab and had  an indwelling 4-French sheath. He was prepped and draped in the usual  fashion. Subcutaneous Xylocaine was utilized to numb the groin. The 4-French  sheath was then exchanged for a 6-French sheath and a JL-4 guiding catheter  was utilized to intubate the left main. Bivalirudin was given according to  protocol. We used a traverse wire, and then subsequently, we were able to  use a Prowater. None of these were able to successfully cross the lesion. We  therefore passed an over the wire balloon down to the lesion site in order  to be able to exchange for more vigorous wires. We were able to get into the  small site that  was thought to represent the LAD. With this, the Prowater  wire was subsequently able to move across the lesion into the distal vessel.  A 1.5 mm balloon dilatation was then performed followed by a 2 mm balloon.  There was marked improvement in the appearance of the artery. We then  attempted to decide what size stent would be appropriate. It appeared that a  2.5 stent would clearly be oversized. So we elected to use 2.25 mini Vision  non drug-eluting platform. The 2.25 was taken up to 13 atmospheres distally.  A second overlapping 18 mm x 2.25 stent was then placed just after the  bifurcation. With this, there was dramatic improvement in the appearance of  the artery. Post dilatations were then done with a 2.5 mm balloon. As noted,  there was marked improvement the appearance of the vessel. There was re-  establishment of TIMI III flow. The stenosis was reduced from 99% down to 0%  and TIMI flow was changed from TIMI flow II to TIMI flow III.   ANGIOGRAPHIC DATA:  1.  The left main is free of critical disease.  2.  The left anterior descending has some proximal irregularity of      approximately 20-30%  just after the large diagonal branch. There was a      subtotal occlusion of the LAD which was demonstrating TIMI II flow. Just      after this, the vessel appeared to be about a 2 mm artery. The anterior      descending demonstrates about 2.25 mm artery at maximum. Following      balloon and stenting, there was excellent TIMI III flow down the vessel      with two long overlapping stents. There was a large diagonal which has      about 50% narrowing at the ostium which is slightly compressed by the      stent at the ostium at the takeoff of the main LAD.   CONCLUSION:  Successful percutaneous stenting of the left anterior  descending artery with overlapping nondrug-eluting stent platform.   PLAN:  The patient will be treated with aspirin and Plavix. He will need a  least four weeks of  Plavix and preferably for a minimum of one year. Follow-  up exercise testing in four to six months would be recommended to assess for  restenosis.      Arturo Morton. Riley Kill, M.D. [facility]  Electronically Signed     TDS/MEDQ  D:  03/27/2005  T:  03/29/2005  Job:  [id number]   cc:   Amada Kingfisher, M.D.  [facility]  [address], [place], [place] [address]   Learta Codding, M.D. [facility]  [address]  [address]  [place]  [place] [address]   [facility]   Patient's medical record

## 2010-08-24 NOTE — Op Note (Signed)
Ricardo Soto, Ricardo Soto         ACCOUNT NO.:  [id number]   MEDICAL RECORD NO.:  [id number]          PATIENT TYPE:  INP   LOCATION:  5011                         FACILITY:  [facility]   PHYSICIAN:  Mila Homer. Sherlean Foot, M.D. DATE OF BIRTH:  [birth year]   DATE OF PROCEDURE:  03/03/2006  DATE OF DISCHARGE:                                 OPERATIVE REPORT   SURGEON:  Mila Homer. Sherlean Foot, M.D.   ASSISTANT:  Legrand Pitts. Duffy, P.A.   PREOPERATIVE DIAGNOSIS:  Right knee osteoarthritis.   POSTOPERATIVE DIAGNOSIS:  Right knee osteoarthritis.   PROCEDURE:  Right total knee arthroplasty.   ANESTHESIA:  General.   COMPLICATIONS:  None.   DRAINS:  One Hemovac, 1 pain catheter.   ESTIMATED BLOOD LOSS:  300 cc.   TOURNIQUET TIME:  59 minutes.   INDICATIONS FOR PROCEDURE:  The patient is a 77 year old white male with  failure of conservative measures for osteoarthritis of the right knee.   DESCRIPTION OF PROCEDURE:  The patient was laid supine and administered  general anesthesia.  He was in a supine position.  A Foley catheter was  placed.  The right leg was prepped and draped in the usual sterile fashion.  A #10 blade was used to make a midline incision approximately 6 inches in  length.  A fresh blade was used to make a median parapatellar arthrotomy and  perform a synovectomy.  The deep MCL was then elevated off the medial crest  of the tibia.  I then everted the patella; it measured 24 mm thick, and I  reamed down to 15 mm.  I drilled 3 lug holes for a 35-mm template, and with  a 35 trial prosthesis in place, it also measured 24 mm thick.  I removed the  trial patella and went into flexion.  I then used the extramedullary  alignment system to make a perpendicular cut to the anatomic axis of the  tibial shaft.  I then removed the cut surface of the bone and the  extramedullary guide.  I made an intramedullary drill hole in the femur.  I  placed an intramedullary guide on the 6-degree valgus  cut, tamped it down to  the distal aspect of the femur, and pinned the distal femoral cutting block  into place.  I removed the intramedullary guide.  I made the distal femoral  cut with a sagittal saw.  I marked out the epicondylar axis.  The posterior  condylar angle measured 3 degrees.  I sized to a size F and pinned it  through the 3-degree external rotation holes.  I then fastened the 4-in-1  cutting block to the femur and made the anterior, posterior and chamfer  cuts.  I then placed a 10-mm spacer block in the knee and had good flexion  and extension gap balance.  I then placed a lamina spreader in the knee and  removed the ACL, PCL, medial and lateral menisci and posterior condylar  osteophytes.  I then used the High Flex cutting block and took 2 mm of bone  off the posterior condyles.  I then finished the tibia with a size  6 tibial  tray drilling keel.  I then trialed with a size 6 tibial tray, a size F High  Flex femur, size 10 High Flex poly, and had good flexion and extension gap  balance, dropped at an angle back to 135 degrees and good patellar tracking.  I then removed the trial components and copiously irrigated.  I then  cemented in the components, removed excess cement and allowed the cement to  harden in extension.  I cemented in the patella as well and removed excess  cement there.  I then placed a Hemovac coming out superolaterally and deep  to the arthrotomy, and  a pain catheter coming out superficial to the  arthrotomy and coming out superomedially.  I then let the tourniquet down;  this was at 59 minutes.  I then closed the arthrotomy with figure-of-eight  #1 Vicryl sutures, deep soft tissues with interrupted 0 Vicryl sutures, a  subcuticular 2-0 Vicryl stitch and skin staples.  I dressed with Xeroform,  dressing sponges, sterile Webril and TED stocking.           ______________________________  Mila Homer. Sherlean Foot, M.D.     SDL/MEDQ  D:  03/04/2006  T:   03/05/2006  Job:  [id number]

## 2010-08-24 NOTE — Cardiovascular Report (Signed)
NAME:  Ricardo Soto, REVOLORIO NO.:  [id number]   MEDICAL RECORD NO.:  [id number]          PATIENT TYPE:  OIB   LOCATION:  1962                         FACILITY:  [facility]   PHYSICIAN:  Charlton Haws, M.D.     DATE OF BIRTH:  Apr 27, 1931   DATE OF PROCEDURE:  03/27/2005  DATE OF DISCHARGE:  03/27/2005                              CARDIAC CATHETERIZATION   INDICATIONS:  Severe substernal chest pain three weeks ago. Myoview  indicating apical ischemia.   Cine catheterization done with 4-French catheter from right femoral artery.   Left main coronary artery was normal.   The left anterior descending artery had a 60% tubular lesion at the ostium.  The LAD itself was subtotally occluded after the takeoff of the first  diagonal branch. There was some trickle flow to the mid and distal LAD.  There were also right to left collaterals. The first diagonal branch was a  medium to large size vessel with 70% tubular disease.   Circumflex coronary artery was nondominant and normal.   Right coronary artery had 30-40% multiple discrete lesions proximally. There  was 50% multiple discrete lesions in the midvessel as indicated before.  There were right to left septal collaterals.   RAO VENTRICULOGRAPHY:  RAO ventriculography showed an EF of 50-55%. There is  apical wall hypokinesis. There is no MR. Aortic pressure is 120/66, LV  pressure is 120/9.   IMPRESSION:  The films will be reviewed with Dr. Riley Kill; however, think  that he should probably proceed with PTCA and stenting of the mid-LAD. He  certainly has viable myocardium there as his Myoview showed only apical  ischemia and his LV gram shows minimal wall motion abnormalities. I suspect  the right collaterals were better than perceived on angiography.   If Dr. Riley Kill feels that the ostial LAD lesion is too tight or if he has  difficulty passing the wire, he certainly would be a candidate for CABG with  vein graft to the distal  right and LIMA to the LAD.           ______________________________  Charlton Haws, M.D.     PN/MEDQ  D:  03/27/2005  T:  03/28/2005  Job:  [id number]   cc:   Learta Codding, M.D. [facility]  [address]  [address]  [place]  [place] [address]   Park Breed, M.D.  [place], [place]

## 2010-08-24 NOTE — Discharge Summary (Signed)
Ricardo Soto, Ricardo Soto         ACCOUNT NO.:  [id number]   MEDICAL RECORD NO.:  [id number]          PATIENT TYPE:  INP   LOCATION:  5023                         FACILITY:  [facility]   PHYSICIAN:  Mila Homer. Sherlean Foot, M.D. DATE OF BIRTH:  [birth year]   DATE OF ADMISSION:  11/25/2005  DATE OF DISCHARGE:  11/28/2005                                 DISCHARGE SUMMARY   ADMISSION DIAGNOSES:  1. End-stage osteoarthritis bilateral knees, left worse than right.  2. Coronary artery disease with history of stents.  3. Hypercholesterolemia.  4. Benign prostatic hypertrophy.  5. Solitary kidney.   DISCHARGE DIAGNOSES:  1. End-stage osteoarthritis bilateral knees now status post left total      knee arthroplasty.  2. Acute blood loss anemia secondary to surgery.  3. Constipation.  4. Coronary artery disease with history of stents.  5. Hypercholesterolemia.  6. Benign prostatic hypertrophy.  7. Solitary kidney.   SURGICAL PROCEDURE:  On November 25, 2005 Mr. Lowdermilk underwent a left  total knee arthroplasty by Dr. Mila Homer. Lucey, assisted by Rexene Edison P.A.-  C.  He had a femoral component size F left placed with a fluted stem tibial  component size 6.  A NexGen complete knee solution all poly patella size 35  9 mm thickness with a NexGen complete legacy knee LPS articular surface size  green EF 10 mm height.   COMPLICATIONS:  None.   CONSULTS:  1. Case management, physical therapy consult November 26, 2005  2. Physical therapy consult November 27, 2005   HISTORY OF PRESENT ILLNESS:  A 77 year old white male patient presented to  Dr. Sherlean Foot with five to 10-year history of gradual onset progressively  worsening bilateral knee pain with the left worse than the right.  He has  had no known injury or prior surgery to his knee.  The left knee pain is a  constant, achy stiffness diffuse about the joint without radiation.  It  increases with walking and weightbearing and then decreases with ice  and  Ultram.  The knee grinds, gives way, and swells at times.  Cortisone and  Synvisc provided minimal relief.  He has failed conservative treatment and x-  rays show end-stage arthritic changes.  Because of this he is presenting for  a left knee replacement.   HOSPITAL COURSE:  Mr. Misener tolerated his surgical procedure well  without immediate postoperative complications.  He was transferred to 5000.  Postop day one he was afebrile, vitals stable.  Hemoglobin at 11, hematocrit  32.  Leg was neurovascularly intact.  He was started on therapy per  protocol.  He had been in telemetry overnight due to some bradycardia postop  and that had remained stable so he was able to be transferred to the  orthopedic floor.   On postop day two the patient was doing well, Tmax 100.7, vitals stable.  Hemoglobin 10, hematocrit 28.6.  Leg was neurovascularly intact.  The  incision was well-approximated with staples, minimal drainage.  He was  tolerating CPM and therapy.  He was switched to p.o. pain meds and continued  on therapy.   Postop day three he  is doing well.  He has progressed nicely with therapy.  Hemoglobin/hematocrit are stable.  Left knee incision is unchanged.  He is  having some difficulty with constipation that will be treated with a  laxative but he will be discharged home later today.   DISCHARGE INSTRUCTIONS:   DIET:  He can resume his regular prehospitalization diet.   MEDICATIONS:  He may resume his prehospitalization meds except no Plavix  while on Lovenox.  Home meds included:  1. Aspirin 81 mg p.o. q.a.m.  2. Plavix 75 mg p.o. q.a.m.  3. Vytorin 10/40 mg p.o. nightly.  4. Metoprolol 25 mg p.o. every 6 p.m.  5. Tramadol 50 mg one to two p.o. q.6.h p.r.n. for pain.  He is not to      take while on other pain meds.  6. Multivitamin with minerals one p.o. q.a.m.  7. Tylenol 500 mg one p.o. q.6h. p.r.n. for pain or fever.  He can use      this if not using full doses of  Percocet.   ADDITIONAL MEDS AT THIS TIME INCLUDE:  1. Lovenox 40 mg subcu q.a.m., last dose December 09, 2005, 11 with no      refill.  2. Percocet 5/325 mg one to two p.o. q.4h. p.r.n. for pain, 60 with no      refill.  3. Skelaxin 800 mg half to one tablet p.o. q.6h. p.r.n. for spasms, 40      with no refill.  4. He needs to take iron tablet once a day for one month.   ACTIVITY:  He can be out of bed weightbearing as tolerated on the left leg  with use of the walker.  He is to have home CPM 0-100 degrees six to eight  hours a day and home health PT per [facility].  Please see the  blue total knee discharge sheet for further activity instructions.   WOUND CARE:  He may shower after no drainage from the wound for two days.  Please see the blue total knee discharge sheet for further wound care  instructions.   FOLLOW-UP:  He needs to follow up with Dr. Sherlean Foot in our office on Tuesday,  December 10, 2005 and needs to call [phone] for that appointment.   LABORATORY DATA:  Hemoglobin/hematocrit have ranged from 14.7 and 43.3 on  August 15 to 10 and 28.6 on the 22nd to 10 and 28.5 on the 23rd.  Platelets  remained within normal limits.  White count went to a high of 12.5 on August  22 and is now 10.3 on August 23.   Glucose went to a high of 130 on August 22 with it being 106 on August 23.  Potassium dropped to a low of 3.6 on August 22 and on August 23 is now 4.  Total bilirubin was high on August 15 at 1.6.  Calcium dropped to a low of  8.3 on August 22.  All other laboratory studies were within normal limits.      Legrand Pitts Duffy, P.A.    ______________________________  Mila Homer. Sherlean Foot, M.D.    KED/MEDQ  D:  11/28/2005  T:  11/28/2005  Job:  [id number]   cc:   Welton Flakes, M.D.

## 2010-08-24 NOTE — H&P (Signed)
NAME:  JEZIEL, HOFFMANN         ACCOUNT NO.:  [id number]   MEDICAL RECORD NO.:  [id number]          PATIENT TYPE:  INP   LOCATION:  NA                           FACILITY:  [facility]   PHYSICIAN:  Mila Homer. Sherlean Foot, M.D. DATE OF BIRTH:  Jan 23, 1932   DATE OF ADMISSION:  11/25/2005  DATE OF DISCHARGE:                                HISTORY & PHYSICAL   CHIEF COMPLAINT:  Left knee pain for the last 5-10 years.   HISTORY OF PRESENT ILLNESS:  This 77 year old white male patient presented  to Dr. Sherlean Foot with a 5 to 10-year history of gradual onset progressively  worsening bilateral knee pain.  At this point, the left knee hurts more than  the right.  There has no been no known injury or prior surgery to the knee.  The pain is constant with weightbearing.  It is an achy stiffness diffuse  about the joint without radiation.  The pain increases with walking and  weightbearing and then decreases with ice and Ultram.  The knee does grind  and give way at times and does swell.  There is no popping, catching,  locking of the knee.  He has received cortisone and Synvisc injections in  the past with limited relief.  He does not ambulate with any assistive  devices.   No known drug allergies.   CURRENT MEDICATIONS:  1. Aspirin 81 mg 1 tablet p.o. q.a.m., last dose November 20, 2005.  2. Plavix 75 mg 1 tablet p.o. q.a.m., last dose November 16, 2005.  3. Vitorin 10/40 mg 1 tablet p.o. q.a.m.  4. Metoprolol 25 mg 1 tablet p.o. q.a.m.  5. Tramadol 50 mg 1 tablet p.o. q.6 h p.r.n. for pain.  6. Multivitamin and mineral 1 tablet p.o. q.a.m.Marland Kitchen  7. Tylenol 500 mg 1-2 tablets p.o. q.6 h p.r.n. for pain.   PAST MEDICAL HISTORY:  1. Hypercholesterolemia.  2. Coronary artery disease with history of two stents done March 27, 2005.  3. Benign prostatic hypertrophy.  4. 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  5. History of pneumonia  as a child.   PAST SURGICAL HISTORY:  1. Appendectomy 1938.  2. Herniorrhaphy December 2000.  3. Cataract removal January 2001 and 2004.  4. TURP December 2004.  5. Left nephrectomy January 2005.  6. Angioplasty December 2006.   He denies any complications from the above-mentioned procedures.   SOCIAL HISTORY:  He does have a 25-pack-year history of cigarette smoking  which he quit in 1984.  He drinks alcohol about three times a week, only one  to two beers.  He does not use any drugs.  He is married and is retired from  [employer] and [employer].  He lives, just he and his wife, in a tri-  level house.  They have two children.  His medical doctor is Dr. Corinna Lines at  [facility], and his cardiologist is Dr. Jens Som with  [facility].   FAMILY HISTORY:  Mother is alive at  age 30 with osteoarthritis and  hypertension.  Father died at age of 38 with macular degeneration and COPD.  He has a sister alive at age 8 with arthritis.  Grandparents had a history  of heart disease and heart attack.  His daughter is 31; son is 34, and they  are both alive and well.   REVIEW OF SYSTEMS:  He has had the cataract removals and does wear glasses  for reading.  He did have problems with chest pain and shortness of breath  in November 2006 before his catheterization but no problems since then.  Remote history of pneumonia.  He has had the kidney problems outlined  before, any he had a kidney stone at age 46.  He had blood in his urine and  frequent urination with prostatic enlargement.  All other systems were  negative and noncontributory.   PHYSICAL EXAMINATION:  GENERAL:  Well-developed, well-nourished thin white  male in no acute distress.  Talks easily with examiner.  Accompanied by his  wife.  Mood and affect are appropriate.  Height 5 feet 11 inches, weight 180 pounds, BMI is 24.  VITAL SIGNS:  Temperature 97.4 degrees Fahrenheit, pulse 68, respirations 12  and blood  pressure 118/70.  HEENT:  Normocephalic, atraumatic without frontal or maxillary sinus  tenderness to palpation.  Conjunctiva pink.  Sclerae anicteric.  PERRLA.  EOMs intact.  No visible external ear deformities.  Hearing grossly intact.  Tympanic membranes pearly gray bilaterally with good light reflex.  Nose and  nasal septum midline.  Nasal mucosa pink and moist without exudates or  polyps noted.  Buccal mucosa pink and moist.  Dentition in good repair.  Pharynx without erythema or exudates.  Tongue and uvula midline.  Tongue  without fasciculations, and uvula rises equally with phonation.  NECK:  No visible masses or lesions noted.  Trachea midline.  No palpable  lymphadenopathy or thyromegaly.  Carotids +2 bilaterally without bruits.  Full range of motion, nontender to palpation along the cervical spine.  CARDIOVASCULAR:  Heart rate and rhythm regular.  S1-S2 present without rubs,  clicks or murmurs noted.  RESPIRATORY:  Respirations even and unlabored.  Breath sounds clear to  auscultation bilaterally without rales or wheezes noted.  ABDOMEN:  Colon rounded abdominal contour.  Bowel sounds present x4  quadrants.  Soft, nontender to palpation without hepatosplenomegaly nor CVA  tenderness.  Femoral pulses +2 bilaterally.  Nontender to palpation along  the vertebral column.  BREASTS/GU/RECTAL:  These exams deferred at this time.  MUSCULOSKELETAL:  No obvious deformities bilateral upper extremities with  full range of motion these extremities without pain.  Radial pulses +2  bilaterally.  Full range of motion of his hips, ankles and toes bilaterally.  DP and PT pulses +2.  No lower extremity edema.  No calf pain with  palpation.  Negative Homans' sign bilaterally.   Right knee has full extension and flexion to 130 degrees with moderate  patellofemoral crepitus.  No pain with palpation along the joint line.  No  effusion.  Stable to varus and valgus stress.  Negative anterior  drawer. Left knee skin intact.  No erythema or ecchymosis.  Full extension and  flexion to 120 degrees with coarse crepitus.  No pain with palpation along  the joint line.  No effusion.  Stable to varus and valgus stress.  Negative  anterior drawer.  There does seem to be a palpable popliteal cyst.  NEUROLOGIC:  Alert and oriented x3.  Cranial nerves  II-XII are grossly  intact.  Strength 5/5 bilateral upper and lower extremities.  Rapid  alternating movements intact.  Deep tendon reflexes 2+ bilateral upper and  lower extremities.  Sensation intact to light touch.   RADIOLOGIC FINDINGS:  Four views taken of his both knees and show medial  compartment collapse almost completely, left a little worse than the right.   IMPRESSION:  1. End-stage osteoarthritis bilateral knees, left worse than right.  2. Coronary artery disease with history of stent.  3. Hypercholesterolemia.  4. Benign prostatic hypertrophy.  5. 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   PLAN:  Mr. Wynona Canes will be admitted to [facility] on November 25, 2005, where he will undergo a left total knee arthroplasty by Dr. Mila Homer.  Lucey.  He will undergo all the routine preoperative laboratory tests and  studies prior to this procedure.  If he has any medical issues while he is  hospitalized, we will consult [facility].      Legrand Pitts Duffy, P.A.    ______________________________  Mila Homer. Sherlean Foot, M.D.    KED/MEDQ  D:  11/20/2005  T:  11/20/2005  Job:  [id number]

## 2010-08-24 NOTE — Op Note (Signed)
NAME:  IRVAN, TIEDT                   ACCOUNT NO.:  [id number]   MEDICAL RECORD NO.:  [id number]                   PATIENT TYPE:  AMB   LOCATION:  DAY                                  FACILITY:  [facility]   PHYSICIAN:  Excell Seltzer. Annabell Howells, M.D.                 DATE OF BIRTH:  [birth year]   DATE OF PROCEDURE:  03/10/2003  DATE OF DISCHARGE:                                 OPERATIVE REPORT   OPERATION/PROCEDURE:  1. Transurethral resection of the prostate.  2. Bladder stone removal.   PREOPERATIVE DIAGNOSES:  1. Benign prostatic hypertrophy with bladder outlet obstruction.  2. Bladder stone.   POSTOPERATIVE DIAGNOSES:  1. Benign prostatic hypertrophy with bladder outlet obstruction.  2. Bladder stone.   SURGEON:  Excell Seltzer. Annabell Howells, M.D.   ANESTHESIA:  Spinal.   DRAINS:  22-French, 3-way Foley catheter.   SPECIMENS:  TUR chips and stone.   COMPLICATIONS:  None.   INDICATIONS:  Mr. Compston is a 77 year old white male with a long  history of BPH and bladder outlet obstruction who had gotten by with herbal  supplements for treatment.  He recently presented for followup and was found  to have microscopic hematuria.  He was evaluated and was found to have BPH  bladder outlet obstruction with the bladder up to his umbilicus as well as a  bladder stent.  Incidentally he was also found to have a left renal tumor  which will be dealt with later and adrenal adenoma which is being evaluated.   DESCRIPTION OF PROCEDURE:  The patient was given p.o. Cipro.  He was taken  to the operating room where spinal anesthetic was induced.  He was placed in  the lithotomy position.  His perineum and genitalia were prepped with  Betadine solution and draped in the usual sterile fashion.  His urethra was  dilated with Sissy Hoff sounds  to 30-French and a 28-French continuous flow  resectoscope sheath was inserted without difficulty.  This was fitted with  the 12-degree lens and Wandra Scot handle and a  26 loop.  The prostatic reach  was noted to be elongated with bilobar hyperplasia with coaptation and  obstruction.  There was very little middle lobe.  Examination of the bladder  revealed a small bladder stone and a left hutch diverticulum.  This bladder  stone was evacuated through the scope without difficulty.  The bladder had  previously been inspected thoroughly in the office.  There was moderate  trabeculation.  The ureteral orifices were well away from the bladder neck.   After removal of the bladder stone, the TURP was performed.  Initially the  bladder neck fibers were defined from 5 to 7 o'clock and the floor of the  prostate was resected out to the verumontanum.  The left lobe of the  prostate was then taken down, bladder neck to apex, out to the capsular  fibers.  Chips were evacuated.  The right lobe  was taken down from bladder  neck to apex, out to the capsular fibers.  The chips were evacuated.  Residual apical and anterior tissue was then resected to complete the  procedure.  Final removal of the chips was performed.  Inspection after the  procedure revealed no active bleeding.  The ureteral orifices were  unremarkable.  The external sphincter was intact.  No retained chips or  clots were noted.  The scope was removed leaving the bladder full.  Pressure  on the bladder produced excellent stream.  A 22-French, 3-way Foley catheter  was then inserted with the aid of a catheter guide without difficulty.  The  balloon was filled with 30 mL of sterile fluid.  Bladder was then drained.  The catheter was held on traction and irrigated by hand until clear.  The  catheter was then connected to straight drainage and continuous irrigation.  At this time a B&O suppository was placed.  The patient was taken down from  the lithotomy position, his anesthetic was reversed and he was removed to  the recovery room in stable condition having no complications.  The catheter  was secured to  the thigh on __________ traction.                                               Excell Seltzer. Annabell Howells, M.D.    JJW/MEDQ  D:  03/10/2003  T:  03/10/2003  Job:  [id number]

## 2010-08-24 NOTE — Op Note (Signed)
Ricardo Soto, Ricardo Soto         ACCOUNT NO.:  [id number]   MEDICAL RECORD NO.:  [id number]          PATIENT TYPE:  INP   LOCATION:  4714                         FACILITY:  [facility]   PHYSICIAN:  Mila Homer. Sherlean Foot, M.D. DATE OF BIRTH:  06/11/1931   DATE OF PROCEDURE:  11/25/2005  DATE OF DISCHARGE:                                 OPERATIVE REPORT   SURGEON:  Mila Homer. Sherlean Foot, M.D.   ASSISTANT:  [name], P.A.   ANESTHESIA:  General.   PREOPERATIVE DIAGNOSIS:  Left knee osteoarthritis.   POSTOPERATIVE DIAGNOSIS:  Left knee osteoarthritis.   PROCEDURE:  Left total knee arthroplasty.   INDICATIONS FOR PROCEDURE:  This is a 77 year old white male with  osteoarthritis of the knee.  Informed consent was obtained.   DESCRIPTION OF PROCEDURE:  The patient was placed supine and administered  general anesthesia.  The left knee was prepped and draped in the usual  sterile fashion. A #10 blade was used to make a medium parapatellar  arthrotomy.  I then used a fresh blade to perform a synovectomy and elevate  the deep MCL off the medial crust of the tibia.  I then everted the patella  that measured 25 mm.  I ran down the 16 mm and then drilled 3 lug holes for  the 35 template plus 35 __________ placed and measured 25 mm.  I removed the  trial and then went into flexion.  I used the extramedullary alignment to  make a perpendicular cut to the anatomic axis of the tibia and made that cut  with sagittal saw.  We then removed the extramedullary alignment system and  placed an intramedullary drill hole in the femur using intramedullary guide  and accessory guide was cut and pinned the distal femoral cutting block into  place.  Another distal femoral cut with sagittal saw.  Marked out the  epicondylar axis, posterior condylar angle measured 3 degrees.  I sized to a  size F, pinned through the 3-degree holes and made the anterior, posterior,  and chamfer cuts through the 4-in-1 cutting block  for a size F.  I then  placed the laminar spreader in the knee and removed the medial and lateral  menisci and posterior condylar osteophytes.  I then placed a 10-mm spacer  block and the knee had good flexion/extension and good balance.  I then  finished the femur with the finishing block drill and saw. Finished the  tibia with size 5 tibial tray drill and keel.  I then trialed with a size 5  tibial trial, 10 insert and 35 mm saw.  He had good flexion/extension gap,  balance drop and the angle was back to 135 degrees.  I then removed the  trial components and copiously irrigated.  I then cemented in the  components, removed excess cement and let the cement harden with the knee in  extension.  I placed a Hemovac coming out superolaterally and deep to the  arthrotomy, a pain catheter coming out superomedially and superficial to the  arthrotomy.  I closed the arthrotomy with interrupted #1 Vicryl sutures.  Deep soft tissues were closed  with 0 Vicryl sutures.  Subcuticular with 2-0  Vicryl and skin with skin staples.  Dressed with Xeroform dressing, sponged  with sterile Webril.   COMPLICATIONS:  None.   DRAINS:  One Hemovac and one plain catheter.   ESTIMATED BLOOD LOSS:  300 mL.   TOURNIQUET:  52 minutes.           ______________________________  Mila Homer Sherlean Foot, M.D.     SDL/MEDQ  D:  11/25/2005  T:  11/25/2005  Job:  [id number]

## 2010-08-24 NOTE — Discharge Summary (Signed)
NAME:  Ricardo Soto, Ricardo Soto                   ACCOUNT NO.:  [id number]   MEDICAL RECORD NO.:  [id number]                   PATIENT TYPE:  INP   LOCATION:  0362                                 FACILITY:  [facility]   PHYSICIAN:  Excell Seltzer. Annabell Howells, M.D.                 DATE OF BIRTH:  September 13, 1931   DATE OF ADMISSION:  03/10/2003  DATE OF DISCHARGE:  03/12/2003                                 DISCHARGE SUMMARY   HISTORY:  Briefly, Mr. Sanfilippo is a 77 year old white male with multiple  urologic problems including BPH and bladder outlet obstruction and small  bladder stone, history of elevated PSA with negative prostate biopsy, and a  left renal lesion suspicious for renal cell carcinoma.  He comes in for TURP  and removal of his bladder stone in preparation for his eventual  nephrectomy.  For additional details of the history and physical, please see  the attached office H&P.   HOSPITAL COURSE:  The patient was taken to the operating room the day of  admission where he underwent transurethral resection of prostate and removal  of small bladder stone.  He tolerated procedure well without complications  and was left with Foley catheter postoperatively.  His postoperative course  was unremarkable.  His CBI was discontinued along with his IV on the first  postoperative day.  On the second postoperative day the Foley was removed  and he was able to void without difficulty.  He is felt to be ready for  discharge home.   FINAL DIAGNOSES:  1. Benign prostatic hypertrophy.  2. Bladder outlet obstruction.   DISCHARGE MEDICATIONS:  1. Cipro.  2. Vicodin.  3. Resumption of his home medications.   FOLLOWUP:  He was instructed to follow up with me in two to three weeks.   DISPOSITION:  Home.   PROGNOSIS:  Good.   CONDITION ON DISCHARGE:  Improved.                                               Excell Seltzer. Annabell Howells, M.D.    JJW/MEDQ  D:  03/17/2003  T:  03/17/2003  Job:  [id number]

## 2010-08-24 NOTE — Op Note (Signed)
NAME:  Ricardo Soto, Ricardo Soto                   ACCOUNT NO.:  [id number]   MEDICAL RECORD NO.:  [id number]                   PATIENT TYPE:  INP   LOCATION:  0383                                 FACILITY:  [facility]   PHYSICIAN:  Excell Seltzer. Annabell Howells, M.D.                 DATE OF BIRTH:  06-18-1931   DATE OF PROCEDURE:  04/21/2003  DATE OF DISCHARGE:  04/24/2003                                 OPERATIVE REPORT   Redictated from memory.   PROCEDURE:  Left hand assisted laparoscopic radical nephrectomy.   PREOPERATIVE DIAGNOSIS:  Left renal mass.   POSTOPERATIVE DIAGNOSIS:  Left renal mass.   SURGEON:  Excell Seltzer. Annabell Howells, M.D.   ASSISTANT:  Bertram Millard. Dahlstedt, M.D.   ANESTHESIA:  General.   SPECIMENS:  Left kidney.   DRAINS:  Foley.   COMPLICATIONS:  None.   INDICATIONS FOR PROCEDURE:  Mr. Katina Degree is a 77 year old white male who  was found on a recent hematuria evaluation to have a 2.5 cm extraparenchymal  lower pole left renal mass that was not in position for partial nephrectomy.  It was felt that a hand assisted laparoscopic radical nephrectomy was  indicated.   DESCRIPTION OF PROCEDURE:  The patient was given antibiotics, he was taken  to the operating room where a general anesthetic was induced.  The Foley  catheter was placed using sterile technique. He was placed in modified flank  position using a bean bag. His abdomen was shaved and prepped with Betadine  solution and he was draped in the usual sterile fashion. A 5 cm incision was  made just above the umbilicus sufficiently large for placement of a hand. A  hand port was then positioned, a trocar was placed through the hand port,  the abdomen was insufflated with CO2 and the zero degree lens was passed  through the trocar. Additional port sites were placed in the subxiphoid area  and also 5-6 cm lateral to the hand port on the left for a working  instrument. Once these two ports were in position, the camera was  repositioned to the subxiphoid port. A hand was placed through the hand port  and the harmonic scissors were placed through the working port. The colon  was reflected medially by taking down the white line of Toldt. The  splenorenal ligaments were also released. Once the lateral margin of the  kidney was identified, the colonic mesentery was reflected medially to  expose the anterior surface of the kidney. The lower pole of the kidney was  then dissected up until the ureter was identified, the ureter was divided  between clips.  The dissection was then carried into the hilum where the  vein was identified. The artery was palpated posterior to the kidney and at  this point the hilum was dissected out as a single packet. Once I could get  my fingers around the hilum, an endoGIA was placed across the hilum and once  position was assured fired and the hilum was divided. At this point,  additional medial attachments were taken down from the kidney. The adrenal  gland was spared, the upper pole was dissected down as well using the  harmonic scalpel and an occasional clip for hemostasis.  Once the kidney was  mobilized it was removed from the hand port site, inspection of the renal  fossa revealed minimal oozing from some of the superior fatty tissue. A  strip of Surgicel was placed in this area and packed and then reinspected.  No active bleeding was noted. At this point, the trocar was removed from the  port sites under direct vision through the hand port. The hand port was then  removed. The sponge, instrument and needle counts were performed and were  correct. A supraumbilical incision was closed with a running #1 PDS.  The  port sites were closed with #0 Vicryl. The skin was then reapproximated  using clips.  A dressing was applied. The patient was returned to the supine  position, his anesthetic was reversed and he was removed to the recovery  room in stable condition. There were no  complications during the procedure.                                               Excell Seltzer. Annabell Howells, M.D.    JJW/MEDQ  D:  05/12/2003  T:  05/12/2003  Job:  [id number]

## 2010-09-17 ENCOUNTER — Telehealth: Payer: Self-pay | Admitting: Cardiology

## 2010-09-17 NOTE — Telephone Encounter (Signed)
Order faxed for labs

## 2010-09-17 NOTE — Telephone Encounter (Signed)
Pt needs order for blood work/chloesterol ck before appt here 10-09-10 needs order faxed to  [facility] @ [phone]

## 2010-10-09 ENCOUNTER — Ambulatory Visit: Payer: Self-pay | Admitting: Cardiology

## 2010-10-11 ENCOUNTER — Other Ambulatory Visit: Payer: Self-pay | Admitting: *Deleted

## 2010-10-11 MED ORDER — METOPROLOL SUCCINATE ER 25 MG PO TB24: 25.0000 mg | ORAL_TABLET | Freq: Every day | ORAL | Status: DC

## 2010-11-01 ENCOUNTER — Encounter: Payer: Self-pay | Admitting: Cardiology

## 2010-11-05 ENCOUNTER — Encounter: Payer: Self-pay | Admitting: Cardiology

## 2010-11-05 ENCOUNTER — Ambulatory Visit (INDEPENDENT_AMBULATORY_CARE_PROVIDER_SITE_OTHER): Payer: Medicare Other | Admitting: Cardiology

## 2010-11-05 DIAGNOSIS — E7800 Pure hypercholesterolemia, unspecified: Secondary | ICD-10-CM

## 2010-11-05 DIAGNOSIS — I10 Essential (primary) hypertension: Secondary | ICD-10-CM

## 2010-11-05 DIAGNOSIS — Z79899 Other long term (current) drug therapy: Secondary | ICD-10-CM

## 2010-11-05 DIAGNOSIS — I2510 Atherosclerotic heart disease of native coronary artery without angina pectoris: Secondary | ICD-10-CM

## 2010-11-05 MED ORDER — ROSUVASTATIN CALCIUM 20 MG PO TABS: 20.0000 mg | ORAL_TABLET | Freq: Every day | ORAL | Status: DC

## 2010-11-05 NOTE — Progress Notes (Signed)
HPI: The patient is a pleasant gentleman who has a history of PCI of his LAD in December 2006 with a bare-metal stent. His most recent Myoview was performed in July 2010.  At that time, he was found to have no scar or ischemia.  His ejection fraction was 59%. The patient has had previous abdominal ultrasound on October 31, 2005, that showed no aneurysm. I last saw him in July of 2011. Since then he denies any dyspnea, chest pain, palpitations or syncope no pedal edema. There is no claudication. He did note lower extremity myalgias in the spring. He decreased his Crestor from 40-20 mg and his symptoms improved. He then resume 40 mg and is now experiencing myalgias again.  Current Outpatient Prescriptions  Medication Sig Dispense Refill  . acetaminophen (TYLENOL) 325 MG tablet Take 325 mg by mouth every 6 (six) hours as needed.        Marland Kitchen aspirin 81 MG EC tablet Take 81 mg by mouth daily.        . Glucos-Chondroit-Hyaluron-D3 (TRIGOSAMINE MAX ST PO) Take 1-2 tablets by mouth daily.        Marland Kitchen lisinopril (PRINIVIL,ZESTRIL) 5 MG tablet Take 5 mg by mouth daily.        . metoprolol succinate (TOPROL-XL) 25 MG 24 hr tablet Take 1 tablet (25 mg total) by mouth daily.  30 tablet  6  . Multiple Vitamin (MULTIVITAMIN) tablet Take 1 tablet by mouth daily.        . Omega-3 Fatty Acids (FISH OIL CONCENTRATE PO) Take 2 tablets by mouth daily.        . rosuvastatin (CRESTOR) 40 MG tablet Take 40 mg by mouth daily.           Past Medical History  Diagnosis Date  . CAD (coronary artery disease)     hx of 2 stents 03/27/05  . HLD (hyperlipidemia)   . BPH (benign prostatic hypertrophy)     Past Surgical History  Procedure Date  . Appendectomy 1938  . Hernia repair 12/00  . Cataract extraction 1/01 and '04  . Transurethral resection of prostate 2004  . Left nephrectomy 1/05  . Left knee total arthroplasty 11/25/05    History   Social History  . Marital Status: Married    Spouse Name: N/A    Number of  Children: N/A  . Years of Education: N/A   Occupational History  . Not on file.   Social History Main Topics  . Smoking status: Former Games developer  . Smokeless tobacco: Not on file   Comment: tobacco use - no  . Alcohol Use: Yes     1-2 beers   . Drug Use: No  . Sexually Active: Not on file   Other Topics Concern  . 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     ROS: no fevers or chills, productive cough, hemoptysis, dysphasia, odynophagia, melena, hematochezia, dysuria, hematuria, rash, seizure activity, orthopnea, PND, pedal edema, claudication. Remaining systems are negative.  Physical Exam: Well-developed well-nourished in no acute distress.  Skin is warm and dry.  HEENT is normal.  Neck is supple. No thyromegaly.  Chest is clear to auscultation with normal expansion.  Cardiovascular exam is regular rate and rhythm.  Abdominal exam nontender or distended. No masses palpated. Extremities show no edema. neuro grossly intact  ECG Normal sinus rhythm at  a rate of 71. Minor nonspecific ST changes.

## 2010-11-05 NOTE — Patient Instructions (Signed)
Your physician wants you to follow-up in: ONE YEAR You will receive a reminder letter in the mail two months in advance. If you don't receive a letter, please call our office to schedule the follow-up appointment.   DECREASE CRESTOR 20MG  ONCE DAILY  Your physician recommends that you return for lab work in: 6 [facility]

## 2010-11-05 NOTE — Assessment & Plan Note (Signed)
Continue aspirin, statin, beta blocker and ACE inhibitor. Plan repeat functional study when he returns in one year. Continue risk factor modification.

## 2010-11-05 NOTE — Assessment & Plan Note (Signed)
Patient experiencing myalgias on 40 mg of Crestor. Decreased to 20 mg daily. Check lipids, liver, potassium and renal function in 6 weeks.

## 2010-11-30 ENCOUNTER — Other Ambulatory Visit: Payer: Self-pay | Admitting: Cardiology

## 2010-11-30 MED ORDER — ROSUVASTATIN CALCIUM 20 MG PO TABS: 20.0000 mg | ORAL_TABLET | Freq: Every day | ORAL | Status: DC

## 2010-11-30 NOTE — Telephone Encounter (Signed)
Please call in RX for pt.

## 2010-12-05 ENCOUNTER — Other Ambulatory Visit: Payer: Self-pay | Admitting: *Deleted

## 2010-12-05 MED ORDER — ROSUVASTATIN CALCIUM 20 MG PO TABS: 20.0000 mg | ORAL_TABLET | Freq: Every day | ORAL | Status: DC

## 2010-12-20 ENCOUNTER — Encounter: Payer: Self-pay | Admitting: Cardiology

## 2011-05-06 ENCOUNTER — Other Ambulatory Visit: Payer: Self-pay | Admitting: Cardiology

## 2011-05-06 MED ORDER — METOPROLOL SUCCINATE ER 25 MG PO TB24: 25.0000 mg | ORAL_TABLET | Freq: Every day | ORAL | Status: DC

## 2011-11-05 ENCOUNTER — Encounter: Payer: Self-pay | Admitting: Cardiology

## 2011-11-05 ENCOUNTER — Telehealth: Payer: Self-pay | Admitting: Cardiology

## 2011-11-05 ENCOUNTER — Ambulatory Visit (INDEPENDENT_AMBULATORY_CARE_PROVIDER_SITE_OTHER): Payer: Medicare Other | Admitting: Cardiology

## 2011-11-05 ENCOUNTER — Other Ambulatory Visit: Payer: Self-pay | Admitting: *Deleted

## 2011-11-05 VITALS — BP 132/82 | HR 65 | Ht 71.0 in | Wt 187.0 lb

## 2011-11-05 DIAGNOSIS — I2510 Atherosclerotic heart disease of native coronary artery without angina pectoris: Secondary | ICD-10-CM

## 2011-11-05 DIAGNOSIS — N289 Disorder of kidney and ureter, unspecified: Secondary | ICD-10-CM

## 2011-11-05 DIAGNOSIS — I10 Essential (primary) hypertension: Secondary | ICD-10-CM

## 2011-11-05 DIAGNOSIS — R29898 Other symptoms and signs involving the musculoskeletal system: Secondary | ICD-10-CM

## 2011-11-05 DIAGNOSIS — E7800 Pure hypercholesterolemia, unspecified: Secondary | ICD-10-CM

## 2011-11-05 LAB — BASIC METABOLIC PANEL
BUN: 33 mg/dL — ABNORMAL HIGH (ref 6–23)
Calcium: 9.7 mg/dL (ref 8.4–10.5)
GFR: 41.95 mL/min — ABNORMAL LOW (ref >60.00)
Glucose, Bld: 92 mg/dL (ref 70–99)
Potassium: 4.8 mEq/L (ref 3.5–5.1)

## 2011-11-05 LAB — LIPID PANEL
Cholesterol: 151 mg/dL (ref 0–200)
HDL: 49.3 mg/dL (ref >39.00)
VLDL: 28.4 mg/dL (ref 0.0–40.0)

## 2011-11-05 LAB — HEPATIC FUNCTION PANEL
ALT: 25 U/L (ref 0–53)
AST: 26 U/L (ref 0–37)
Bilirubin, Direct: 0.2 mg/dL (ref 0.0–0.3)
Total Bilirubin: 1.6 mg/dL — ABNORMAL HIGH (ref 0.3–1.2)
Total Protein: 7.3 g/dL (ref 6.0–8.3)

## 2011-11-05 NOTE — Assessment & Plan Note (Signed)
Continue statin. Check lipids and liver. He did have myalgias previously and discontinued his Crestor for 6 weeks with improvement. However he is now back on this medication with no symptoms. We may need to discontinue the Crestor in the future if his symptoms return.

## 2011-11-05 NOTE — Assessment & Plan Note (Signed)
Continue aspirin and statin. Schedule Myoview for risk stratification. 

## 2011-11-05 NOTE — Patient Instructions (Addendum)
Your physician wants you to follow-up in: ONE YEAR WITH DR Shelda Pal will receive a reminder letter in the mail two months in advance. If you don't receive a letter, please call our office to schedule the follow-up appointment.   Your physician recommends that you HAVE LAB WORK TODAY  Your physician has requested that you have a lexiscan myoview. For further information please visit [email or web address] Please follow instruction sheet, as given.   Your physician has requested that you have a lower  extremity venous duplex. This test is an ultrasound of the veins in the legs.. It looks at venous blood flow that carries blood from the heart to the legs or arms. Allow one hour for a Lower Venous exam. Allow thirty minutes for an Upper Venous exam. There are no restrictions or special instructions.  REFERRAL TO NEUROLOGY FOR LEFT LEG WEAKNESS

## 2011-11-05 NOTE — Assessment & Plan Note (Signed)
Patient has isolated edema of left ankle and foot. He also has weakness with decreased ability to dorsiflex. I will schedule venous Dopplers although I think deep venous thrombosis is unlikely. We will arrange neurology evaluation for his new onset weakness left foot.

## 2011-11-05 NOTE — Assessment & Plan Note (Signed)
Blood pressure controlled. Continue present medications. Check potassium and renal function. 

## 2011-11-05 NOTE — Progress Notes (Signed)
HPI: The patient is a pleasant gentleman who has a history of PCI of his LAD in December 2006 with a bare-metal stent. His most recent Myoview was performed in July 2010. At that time, he was found to have no scar or ischemia. His ejection fraction was 59%. The patient has had previous abdominal ultrasound on October 31, 2005, that showed no aneurysm. I last saw him in July of 2012. Since then there is no chest pain, dyspnea or syncope. However he has noticed mild edema in his left foot and ankle for approximately 3 months. He also has developed decreased strength and inability to flex his left foot. There is no change in sensation. No recent travel. No leg injury.   Current Outpatient Prescriptions  Medication Sig Dispense Refill  . acetaminophen (TYLENOL) 325 MG tablet Take 325 mg by mouth every 6 (six) hours as needed.        Marland Kitchen aspirin 81 MG EC tablet Take 81 mg by mouth daily.        Marland Kitchen FOLIC ACID PO Take 1 tablet by mouth daily.      . Glucos-Chondroit-Hyaluron-D3 (TRIGOSAMINE MAX ST PO) Take 1-2 tablets by mouth daily.        Marland Kitchen lisinopril (PRINIVIL,ZESTRIL) 5 MG tablet Take 5 mg by mouth daily.        . metoprolol succinate (TOPROL-XL) 25 MG 24 hr tablet Take 1 tablet (25 mg total) by mouth daily.  30 tablet  8  . Multiple Vitamin (MULTIVITAMIN) tablet Joint supplement  1 tab po qd      . Omega-3 Fatty Acids (FISH OIL CONCENTRATE PO) Take 2 tablets by mouth daily.        . traMADol (ULTRAM) 50 MG tablet Take 50 mg by mouth every 6 (six) hours as needed.         Past Medical History  Diagnosis Date  . CAD (coronary artery disease)     hx of 2 stents 03/27/05  . HLD (hyperlipidemia)   . BPH (benign prostatic hypertrophy)     Past Surgical History  Procedure Date  . Appendectomy 1938  . Hernia repair 12/00  . Cataract extraction 1/01 and '04  . Transurethral resection of prostate 2004  . Left nephrectomy 1/05  . Left knee total arthroplasty 11/25/05    History   Social History   . Marital Status: Married    Spouse Name: N/A    Number of Children: N/A  . Years of Education: N/A   Occupational History  . Not on file.   Social History Main Topics  . Smoking status: Former Games developer  . Smokeless tobacco: Not on file   Comment: tobacco use - no  . Alcohol Use: Yes     1-2 beers   . Drug Use: No  . Sexually Active: Not on file   Other Topics Concern  . 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     ROS: weakness and left foot but no fevers or chills, productive cough, hemoptysis, dysphasia, odynophagia, melena, hematochezia, dysuria, hematuria, rash, seizure activity, orthopnea, PND, claudication. Remaining systems are negative.  Physical Exam: Well-developed well-nourished in no acute distress.  Skin is warm and dry.  HEENT is normal.  Neck is supple.  Chest is clear to auscultation with normal expansion.  Cardiovascular exam is regular rate and rhythm.  Abdominal  exam nontender or distended. No masses palpated. Extremities show trace edema left foot. Neuro decreased strength with dorsi flexion of left foot.  ECG sinus rhythm at a rate of 65. No ST changes.

## 2011-11-05 NOTE — Telephone Encounter (Signed)
Close  

## 2011-11-06 ENCOUNTER — Encounter (INDEPENDENT_AMBULATORY_CARE_PROVIDER_SITE_OTHER): Payer: Medicare Other

## 2011-11-06 DIAGNOSIS — M7989 Other specified soft tissue disorders: Secondary | ICD-10-CM

## 2011-11-06 DIAGNOSIS — R29898 Other symptoms and signs involving the musculoskeletal system: Secondary | ICD-10-CM

## 2011-11-11 ENCOUNTER — Ambulatory Visit (HOSPITAL_COMMUNITY): Payer: Medicare Other | Attending: Cardiology | Admitting: Radiology

## 2011-11-11 VITALS — BP 107/63 | HR 49 | Ht 71.0 in | Wt 189.0 lb

## 2011-11-11 DIAGNOSIS — I4949 Other premature depolarization: Secondary | ICD-10-CM

## 2011-11-11 DIAGNOSIS — I2510 Atherosclerotic heart disease of native coronary artery without angina pectoris: Secondary | ICD-10-CM

## 2011-11-11 DIAGNOSIS — I10 Essential (primary) hypertension: Secondary | ICD-10-CM | POA: Insufficient documentation

## 2011-11-11 DIAGNOSIS — E785 Hyperlipidemia, unspecified: Secondary | ICD-10-CM | POA: Insufficient documentation

## 2011-11-11 DIAGNOSIS — Z87891 Personal history of nicotine dependence: Secondary | ICD-10-CM | POA: Insufficient documentation

## 2011-11-11 IMAGING — NM NM MISC PROCEDURE
1 series · 12 of 12 positions shown · non-contrast
Comparison: none

[Series 1: rest raw · 6.40mm/px · 2 acquisitions, 12 frames shown]
[im 1/2]
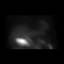
[im 1/2]
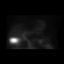
[im 1/2]
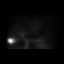
[im 1/2]
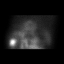
[im 1/2]
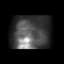
[im 1/2]
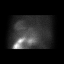
[im 2/2]
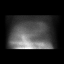
[im 2/2]
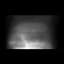
[im 2/2]
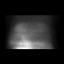
[im 2/2]
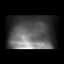
[im 2/2]
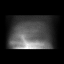
[im 2/2]
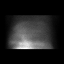

[12 of 12 positions shown; findings below may reference images not displayed]

Canned report from images found in remote index.

Refer to host system for actual result text.

## 2011-11-11 MED ORDER — REGADENOSON 0.4 MG/5ML IV SOLN
0.4000 mg | Freq: Once | INTRAVENOUS | Status: AC
  Administered 2011-11-11: 0.4 mg via INTRAVENOUS

## 2011-11-11 MED ORDER — TECHNETIUM TC 99M TETROFOSMIN IV KIT
10.0000 | PACK | Freq: Once | INTRAVENOUS | Status: AC | PRN
  Administered 2011-11-11: 10 via INTRAVENOUS

## 2011-11-11 MED ORDER — TECHNETIUM TC 99M TETROFOSMIN IV KIT
30.0000 | PACK | Freq: Once | INTRAVENOUS | Status: AC | PRN
  Administered 2011-11-11: 30 via INTRAVENOUS

## 2011-11-11 NOTE — Progress Notes (Signed)
[facility] 3 NUCLEAR MED [address] [place] [address] [phone]  Cardiology Nuclear Med Study  Ricardo Soto is a 78 y.o. male     MRN : [id number]     DOB: Jan 06, 1932  Procedure Date: 11/11/2011  Nuclear Med Background Indication for Stress Test:  Evaluation for Ischemia and PTCA/Stent Patency History:  '06 PTCA/Stent-LAD, EF=50-55%; '10 MPS:No ischemia, EF=59%. Cardiac Risk Factors: History of Smoking, Hypertension and Lipids.  Symptoms:  No cardiac complaints.   Nuclear Pre-Procedure Caffeine/Decaff Intake:  None NPO After: 8:00pm   Lungs:  Clear. O2 Sat: 97% on room air. IV 0.9% NS with Angio Cath:  20g  IV Site: R Forearm  IV Started by:  Stanton Kidney, EMT-P  Chest Size (in):  42 Cup Size: n/a  Height: 5\' 11"  (1.803 m)  Weight:  189 lb (85.73 kg)  BMI:  Body mass index is 26.36 kg/(m^2). Tech Comments:  NA    Nuclear Med Study 1 or 2 day study: 1 day  Stress Test Type:  Lexiscan  Reading MD: Willa Rough, MD  Order Authorizing Provider:  Olga Millers, MD  Resting Radionuclide: Technetium 7m Tetrofosmin  Resting Radionuclide Dose: 11.0 mCi   Stress Radionuclide:  Technetium 101m Tetrofosmin  Stress Radionuclide Dose: 33.0 mCi           Stress Protocol Rest HR: 49 Stress HR: 81  Rest BP: 107/63 Stress BP: 120/69  Exercise Time (min): n/a METS: n/a   Predicted Max HR: 143 bpm % Max HR: 56.64 bpm Rate Pressure Product: 9720   Dose of Adenosine (mg):  n/a Dose of Lexiscan: 0.4 mg  Dose of Atropine (mg): n/a Dose of Dobutamine: n/a mcg/kg/min (at max HR)  Stress Test Technologist: Smiley Houseman, CMA-N  Nuclear Technologist:  Doyne Keel, CNMT     Rest Procedure:  Myocardial perfusion imaging was performed at rest 45 minutes following the intravenous administration of Technetium 63m Tetrofosmin.  Rest ECG: Occasional PAC with marked sinus bradycardia.  Stress Procedure:  The patient received IV Lexiscan 0.4 mg over  15-seconds.  Technetium 75m Tetrofosmin injected at 30-seconds.  There were no significant changes with Lexiscan; occasional PAC's noted.  Quantitative spect images were obtained after a 45 minute delay.  Stress ECG: No significant change from baseline ECG  QPS Raw Data Images:  Patient motion noted; appropriate software correction applied. Stress Images:  Normal homogeneous uptake in all areas of the myocardium. Rest Images:  Normal homogeneous uptake in all areas of the myocardium. Subtraction (SDS):  No evidence of ischemia. Transient Ischemic Dilatation (Normal <1.22):  0.98 Lung/Heart Ratio (Normal <0.45):  0.28  Quantitative Gated Spect Images QGS EDV:  73 ml QGS ESV:  26 ml  Impression Exercise Capacity:  Lexiscan with no exercise. BP Response:  Normal blood pressure response. Clinical Symptoms:  Head pressure ECG Impression:  No significant ST segment change suggestive of ischemia. Comparison with Prior Nuclear Study: No images to compare  Overall Impression:  Normal stress nuclear study.  LV Ejection Fraction: 65%.  LV Wall Motion:  Normal Wall Motion.  Willa Rough, MD

## 2011-11-13 ENCOUNTER — Other Ambulatory Visit: Payer: Self-pay | Admitting: Neurology

## 2011-11-13 DIAGNOSIS — R269 Unspecified abnormalities of gait and mobility: Secondary | ICD-10-CM

## 2011-11-13 DIAGNOSIS — M48061 Spinal stenosis, lumbar region without neurogenic claudication: Secondary | ICD-10-CM

## 2011-11-18 ENCOUNTER — Encounter (INDEPENDENT_AMBULATORY_CARE_PROVIDER_SITE_OTHER): Payer: Medicare Other

## 2011-11-18 DIAGNOSIS — N289 Disorder of kidney and ureter, unspecified: Secondary | ICD-10-CM

## 2011-11-18 DIAGNOSIS — I10 Essential (primary) hypertension: Secondary | ICD-10-CM

## 2011-11-19 ENCOUNTER — Ambulatory Visit
Admission: RE | Admit: 2011-11-19 | Discharge: 2011-11-19 | Disposition: A | Discharge status: Home / Self Care | Payer: Medicare Other | Source: Ambulatory Visit | Attending: Neurology | Admitting: Neurology

## 2011-11-19 DIAGNOSIS — M48061 Spinal stenosis, lumbar region without neurogenic claudication: Secondary | ICD-10-CM

## 2011-11-19 DIAGNOSIS — R269 Unspecified abnormalities of gait and mobility: Secondary | ICD-10-CM

## 2011-11-28 ENCOUNTER — Encounter (HOSPITAL_COMMUNITY): Payer: Self-pay | Admitting: Pharmacy Technician

## 2011-11-28 ENCOUNTER — Other Ambulatory Visit: Payer: Self-pay | Admitting: Neurosurgery

## 2011-12-02 ENCOUNTER — Encounter (HOSPITAL_COMMUNITY): Payer: Self-pay

## 2011-12-02 ENCOUNTER — Ambulatory Visit (HOSPITAL_COMMUNITY)
Admission: RE | Admit: 2011-12-02 | Discharge: 2011-12-02 | Disposition: A | Discharge status: Home / Self Care | Payer: Medicare Other | Source: Ambulatory Visit | Attending: Neurosurgery | Admitting: Neurosurgery

## 2011-12-02 ENCOUNTER — Encounter (HOSPITAL_COMMUNITY)
Admission: RE | Admit: 2011-12-02 | Discharge: 2011-12-02 | Disposition: A | Discharge status: Home / Self Care | Payer: Medicare Other | Source: Ambulatory Visit | Attending: Neurosurgery | Admitting: Neurosurgery

## 2011-12-02 DIAGNOSIS — M47814 Spondylosis without myelopathy or radiculopathy, thoracic region: Secondary | ICD-10-CM | POA: Insufficient documentation

## 2011-12-02 DIAGNOSIS — Z01812 Encounter for preprocedural laboratory examination: Secondary | ICD-10-CM | POA: Insufficient documentation

## 2011-12-02 DIAGNOSIS — Z01810 Encounter for preprocedural cardiovascular examination: Secondary | ICD-10-CM | POA: Insufficient documentation

## 2011-12-02 DIAGNOSIS — Z01818 Encounter for other preprocedural examination: Secondary | ICD-10-CM | POA: Insufficient documentation

## 2011-12-02 HISTORY — DX: Malignant (primary) neoplasm, unspecified: C80.1

## 2011-12-02 HISTORY — DX: Unspecified osteoarthritis, unspecified site: M19.90

## 2011-12-02 HISTORY — DX: Pneumonia, unspecified organism: J18.9

## 2011-12-02 LAB — SURGICAL PCR SCREEN
MRSA, PCR: NEGATIVE
Staphylococcus aureus: NEGATIVE

## 2011-12-02 LAB — CBC
Platelets: 212 10*3/uL (ref 150–400)
RBC: 4.21 MIL/uL — ABNORMAL LOW (ref 4.22–5.81)
RDW: 13.5 % (ref 11.5–15.5)
WBC: 8.4 10*3/uL (ref 4.0–10.5)

## 2011-12-02 LAB — BASIC METABOLIC PANEL
Calcium: 9.7 mg/dL (ref 8.4–10.5)
Creatinine, Ser: 1.3 mg/dL (ref 0.50–1.35)
GFR calc Af Amer: 59 mL/min — ABNORMAL LOW (ref >90)
GFR calc non Af Amer: 51 mL/min — ABNORMAL LOW (ref >90)
Sodium: 142 mEq/L (ref 135–145)

## 2011-12-02 NOTE — Pre-Procedure Instructions (Signed)
20 Ricardo Soto  12/02/2011   Your procedure is scheduled on:  Thursday December 05, 2011.  Report to [facility] at 200 PM.  Call this number if you have problems the morning of surgery: [phone]   Remember:   Do not eat food or drink:After Midnight.    Take these medicines the morning of surgery with A SIP OF WATER: Metoprolol (Toprol XL)   Do not wear jewelry  Do not wear lotions or colognes  Men may shave face and neck.  Do not bring valuables to the hospital.  Contacts, dentures or bridgework may not be worn into surgery.  Leave suitcase in the car. After surgery it may be brought to your room.  For patients admitted to the hospital, checkout time is 11:00 AM the day of discharge.   Patients discharged the day of surgery will not be allowed to drive home.  Name and phone number of your driver:   Special Instructions: CHG Shower Use Special Wash: 1/2 bottle night before surgery and 1/2 bottle morning of surgery.   Please read over the following fact sheets that you were given: Pain Booklet, Coughing and Deep Breathing, MRSA Information and Surgical Site Infection Prevention

## 2011-12-02 NOTE — Progress Notes (Signed)
Patient had a stress test on 11/05/11 EF 65% and a cardiac cath with Dr. Shawnie Pons on 03/2005 and patient had a bare metal stent placed in the LAD. Dr. Bjorn Pippin is nephrologist. Patient denied having a sleep study.

## 2011-12-02 NOTE — Progress Notes (Signed)
Nurse called Dr. Lonie Peak office and spoke with Erie Noe about orders being under "signed and held." Erie Noe informed Nurse that Dr. Wynetta Emery was in surgery today but she would remind him to sign the orders.

## 2011-12-05 ENCOUNTER — Encounter (HOSPITAL_COMMUNITY): Payer: Self-pay | Admitting: *Deleted

## 2011-12-05 ENCOUNTER — Encounter (HOSPITAL_COMMUNITY): Payer: Self-pay | Admitting: Anesthesiology

## 2011-12-05 ENCOUNTER — Ambulatory Visit (HOSPITAL_COMMUNITY)
Admission: RE | Admit: 2011-12-05 | Discharge: 2011-12-06 | Disposition: A | Discharge status: Home / Self Care | Payer: Medicare Other | Source: Ambulatory Visit | Attending: Neurosurgery | Admitting: Neurosurgery

## 2011-12-05 ENCOUNTER — Ambulatory Visit (HOSPITAL_COMMUNITY): Payer: Medicare Other | Admitting: Anesthesiology

## 2011-12-05 ENCOUNTER — Ambulatory Visit (HOSPITAL_COMMUNITY): Payer: Medicare Other

## 2011-12-05 ENCOUNTER — Encounter (HOSPITAL_COMMUNITY)
Admission: RE | Disposition: A | Discharge status: Home / Self Care | Payer: Self-pay | Source: Ambulatory Visit | Attending: Neurosurgery

## 2011-12-05 DIAGNOSIS — I10 Essential (primary) hypertension: Secondary | ICD-10-CM | POA: Insufficient documentation

## 2011-12-05 DIAGNOSIS — I2510 Atherosclerotic heart disease of native coronary artery without angina pectoris: Secondary | ICD-10-CM | POA: Insufficient documentation

## 2011-12-05 DIAGNOSIS — M5126 Other intervertebral disc displacement, lumbar region: Secondary | ICD-10-CM | POA: Insufficient documentation

## 2011-12-05 HISTORY — PX: LUMBAR LAMINECTOMY/DECOMPRESSION MICRODISCECTOMY: SHX5026

## 2011-12-05 SURGERY — LUMBAR LAMINECTOMY/DECOMPRESSION MICRODISCECTOMY 1 LEVEL
Anesthesia: General | Site: Back | Laterality: Bilateral | Wound class: Clean

## 2011-12-05 MED ORDER — NEOSTIGMINE METHYLSULFATE 1 MG/ML IJ SOLN
INTRAMUSCULAR | Status: DC | PRN
  Administered 2011-12-05: 4 mg via INTRAVENOUS

## 2011-12-05 MED ORDER — LIDOCAINE-EPINEPHRINE 1 %-1:100000 IJ SOLN
INTRAMUSCULAR | Status: DC | PRN
  Administered 2011-12-05: 20 mL

## 2011-12-05 MED ORDER — DEXTROSE 5 % IV SOLN
INTRAVENOUS | Status: DC | PRN
  Administered 2011-12-05: 17:00:00 via INTRAVENOUS

## 2011-12-05 MED ORDER — ACETAMINOPHEN 10 MG/ML IV SOLN
1000.0000 mg | Freq: Once | INTRAVENOUS | Status: DC
  Filled 2011-12-05: qty 100

## 2011-12-05 MED ORDER — SODIUM CHLORIDE 0.9 % IV SOLN
INTRAVENOUS | Status: AC
  Filled 2011-12-05: qty 500

## 2011-12-05 MED ORDER — MENTHOL 3 MG MT LOZG: 1.0000 | LOZENGE | OROMUCOSAL | Status: DC | PRN

## 2011-12-05 MED ORDER — 0.9 % SODIUM CHLORIDE (POUR BTL) OPTIME
TOPICAL | Status: DC | PRN
  Administered 2011-12-05: 1000 mL

## 2011-12-05 MED ORDER — SODIUM CHLORIDE 0.9 % IR SOLN
Status: DC | PRN
  Administered 2011-12-05: 17:00:00

## 2011-12-05 MED ORDER — LISINOPRIL 5 MG PO TABS
5.0000 mg | ORAL_TABLET | Freq: Every day | ORAL | Status: DC
  Administered 2011-12-06: 5 mg via ORAL
  Filled 2011-12-05 (×2): qty 1

## 2011-12-05 MED ORDER — FENTANYL CITRATE 0.05 MG/ML IJ SOLN
INTRAMUSCULAR | Status: DC | PRN
  Administered 2011-12-05 (×5): 50 ug via INTRAVENOUS

## 2011-12-05 MED ORDER — MIDAZOLAM HCL 5 MG/5ML IJ SOLN
INTRAMUSCULAR | Status: DC | PRN
  Administered 2011-12-05: 2 mg via INTRAVENOUS

## 2011-12-05 MED ORDER — CYCLOBENZAPRINE HCL 10 MG PO TABS: 10.0000 mg | ORAL_TABLET | Freq: Three times a day (TID) | ORAL | Status: DC | PRN

## 2011-12-05 MED ORDER — SODIUM CHLORIDE 0.9 % IJ SOLN: 3.0000 mL | INTRAMUSCULAR | Status: DC | PRN

## 2011-12-05 MED ORDER — ONDANSETRON HCL 4 MG/2ML IJ SOLN: 4.0000 mg | INTRAMUSCULAR | Status: DC | PRN

## 2011-12-05 MED ORDER — SODIUM CHLORIDE 0.9 % IJ SOLN
3.0000 mL | Freq: Two times a day (BID) | INTRAMUSCULAR | Status: DC
  Administered 2011-12-05: 3 mL via INTRAVENOUS

## 2011-12-05 MED ORDER — DEXAMETHASONE SODIUM PHOSPHATE 10 MG/ML IJ SOLN
INTRAMUSCULAR | Status: AC
  Administered 2011-12-05: 10 mg via INTRAVENOUS
  Filled 2011-12-05: qty 1

## 2011-12-05 MED ORDER — ADULT MULTIVITAMIN W/MINERALS CH
1.0000 | ORAL_TABLET | Freq: Every day | ORAL | Status: DC
  Administered 2011-12-05 – 2011-12-06 (×2): 1 via ORAL
  Filled 2011-12-05 (×2): qty 1

## 2011-12-05 MED ORDER — GLYCOPYRROLATE 0.2 MG/ML IJ SOLN
INTRAMUSCULAR | Status: DC | PRN
  Administered 2011-12-05: .5 mg via INTRAVENOUS

## 2011-12-05 MED ORDER — ACETAMINOPHEN 650 MG RE SUPP: 650.0000 mg | RECTAL | Status: DC | PRN

## 2011-12-05 MED ORDER — LIDOCAINE HCL (CARDIAC) 20 MG/ML IV SOLN
INTRAVENOUS | Status: DC | PRN
  Administered 2011-12-05: 50 mg via INTRAVENOUS

## 2011-12-05 MED ORDER — LACTATED RINGERS IV SOLN
INTRAVENOUS | Status: DC | PRN
  Administered 2011-12-05 (×3): via INTRAVENOUS

## 2011-12-05 MED ORDER — PHENOL 1.4 % MT LIQD: 1.0000 | OROMUCOSAL | Status: DC | PRN

## 2011-12-05 MED ORDER — ASPIRIN 81 MG PO TBEC: 81.0000 mg | DELAYED_RELEASE_TABLET | Freq: Every day | ORAL | Status: DC

## 2011-12-05 MED ORDER — BACITRACIN 50000 UNITS IM SOLR
INTRAMUSCULAR | Status: AC
  Filled 2011-12-05: qty 1

## 2011-12-05 MED ORDER — ACETAMINOPHEN 325 MG PO TABS: 325.0000 mg | ORAL_TABLET | Freq: Four times a day (QID) | ORAL | Status: DC | PRN

## 2011-12-05 MED ORDER — ONE-DAILY MULTI VITAMINS PO TABS: 1.0000 | ORAL_TABLET | Freq: Every day | ORAL | Status: DC

## 2011-12-05 MED ORDER — CEFAZOLIN SODIUM-DEXTROSE 2-3 GM-% IV SOLR
INTRAVENOUS | Status: AC
  Administered 2011-12-05: 2 g via INTRAVENOUS
  Filled 2011-12-05: qty 50

## 2011-12-05 MED ORDER — CEFAZOLIN SODIUM 1-5 GM-% IV SOLN
1.0000 g | Freq: Three times a day (TID) | INTRAVENOUS | Status: DC
Start: 2011-12-06 — End: 2011-12-06
  Administered 2011-12-06: 1 g via INTRAVENOUS
  Filled 2011-12-05 (×2): qty 50

## 2011-12-05 MED ORDER — FENTANYL CITRATE 0.05 MG/ML IJ SOLN: 50.0000 ug | INTRAMUSCULAR | Status: DC | PRN

## 2011-12-05 MED ORDER — PROPOFOL 10 MG/ML IV EMUL
INTRAVENOUS | Status: DC | PRN
  Administered 2011-12-05: 150 mg via INTRAVENOUS

## 2011-12-05 MED ORDER — BUPIVACAINE HCL (PF) 0.25 % IJ SOLN
INTRAMUSCULAR | Status: DC | PRN
  Administered 2011-12-05: 30 mL

## 2011-12-05 MED ORDER — ROCURONIUM BROMIDE 100 MG/10ML IV SOLN
INTRAVENOUS | Status: DC | PRN
  Administered 2011-12-05: 10 mg via INTRAVENOUS
  Administered 2011-12-05: 40 mg via INTRAVENOUS

## 2011-12-05 MED ORDER — ASPIRIN EC 81 MG PO TBEC
81.0000 mg | DELAYED_RELEASE_TABLET | Freq: Every day | ORAL | Status: DC
  Administered 2011-12-06: 81 mg via ORAL
  Filled 2011-12-05: qty 1

## 2011-12-05 MED ORDER — ONDANSETRON HCL 4 MG/2ML IJ SOLN
INTRAMUSCULAR | Status: DC | PRN
  Administered 2011-12-05: 4 mg via INTRAVENOUS

## 2011-12-05 MED ORDER — ACETAMINOPHEN 325 MG PO TABS
650.0000 mg | ORAL_TABLET | ORAL | Status: DC | PRN
  Administered 2011-12-05: 650 mg via ORAL
  Filled 2011-12-05: qty 2

## 2011-12-05 MED ORDER — MIDAZOLAM HCL 2 MG/2ML IJ SOLN: 1.0000 mg | INTRAMUSCULAR | Status: DC | PRN

## 2011-12-05 MED ORDER — SODIUM CHLORIDE 0.9 % IV SOLN: 250.0000 mL | INTRAVENOUS | Status: DC

## 2011-12-05 MED ORDER — HYDROMORPHONE HCL PF 1 MG/ML IJ SOLN
0.5000 mg | INTRAMUSCULAR | Status: DC | PRN
Start: 2011-12-05 — End: 2011-12-06

## 2011-12-05 MED ORDER — METOPROLOL SUCCINATE ER 25 MG PO TB24
25.0000 mg | ORAL_TABLET | Freq: Every day | ORAL | Status: DC
  Filled 2011-12-05 (×2): qty 1

## 2011-12-05 MED ORDER — HEMOSTATIC AGENTS (NO CHARGE) OPTIME
TOPICAL | Status: DC | PRN
  Administered 2011-12-05: 1 via TOPICAL

## 2011-12-05 MED ORDER — THROMBIN 5000 UNITS EX KIT
PACK | CUTANEOUS | Status: DC | PRN
  Administered 2011-12-05 (×2): 5000 [IU] via TOPICAL

## 2011-12-05 MED ORDER — OXYCODONE-ACETAMINOPHEN 5-325 MG PO TABS: 1.0000 | ORAL_TABLET | ORAL | Status: DC | PRN

## 2011-12-05 MED ORDER — EPHEDRINE SULFATE 50 MG/ML IJ SOLN
INTRAMUSCULAR | Status: DC | PRN
  Administered 2011-12-05: 10 mg via INTRAVENOUS

## 2011-12-05 MED ORDER — FOLIC ACID 1 MG PO TABS
1.0000 mg | ORAL_TABLET | Freq: Every day | ORAL | Status: DC
  Administered 2011-12-05 – 2011-12-06 (×2): 1 mg via ORAL
  Filled 2011-12-05 (×2): qty 1

## 2011-12-05 SURGICAL SUPPLY — 51 items
ADH SKN CLS APL DERMABOND .7 (GAUZE/BANDAGES/DRESSINGS) ×1
APL SKNCLS STERI-STRIP NONHPOA (GAUZE/BANDAGES/DRESSINGS) ×1
BAG DECANTER FOR FLEXI CONT (MISCELLANEOUS) ×2 IMPLANT
BENZOIN TINCTURE PRP APPL 2/3 (GAUZE/BANDAGES/DRESSINGS) ×2 IMPLANT
BLADE SURG 11 STRL SS (BLADE) ×2 IMPLANT
BLADE SURG ROTATE 9660 (MISCELLANEOUS) IMPLANT
BRUSH SCRUB EZ PLAIN DRY (MISCELLANEOUS) ×2 IMPLANT
BUR MATCHSTICK NEURO 3.0 LAGG (BURR) ×2 IMPLANT
BUR PRECISION FLUTE 6.0 (BURR) ×2 IMPLANT
CANISTER SUCTION 2500CC (MISCELLANEOUS) ×2 IMPLANT
CLOTH BEACON ORANGE TIMEOUT ST (SAFETY) ×2 IMPLANT
CONT SPEC 4OZ CLIKSEAL STRL BL (MISCELLANEOUS) ×2 IMPLANT
DECANTER SPIKE VIAL GLASS SM (MISCELLANEOUS) ×2 IMPLANT
DERMABOND ADVANCED (GAUZE/BANDAGES/DRESSINGS) ×1
DERMABOND ADVANCED .7 DNX12 (GAUZE/BANDAGES/DRESSINGS) ×1 IMPLANT
DRAPE LAPAROTOMY 100X72X124 (DRAPES) ×2 IMPLANT
DRAPE MICROSCOPE ZEISS OPMI (DRAPES) ×2 IMPLANT
DRAPE POUCH INSTRU U-SHP 10X18 (DRAPES) ×2 IMPLANT
DRAPE PROXIMA HALF (DRAPES) IMPLANT
DRAPE SURG 17X23 STRL (DRAPES) ×2 IMPLANT
DRSG OPSITE 4X5.5 SM (GAUZE/BANDAGES/DRESSINGS) ×2 IMPLANT
ELECT REM PT RETURN 9FT ADLT (ELECTROSURGICAL) ×2
ELECTRODE REM PT RTRN 9FT ADLT (ELECTROSURGICAL) ×1 IMPLANT
GAUZE SPONGE 4X4 16PLY XRAY LF (GAUZE/BANDAGES/DRESSINGS) IMPLANT
GLOVE BIO SURGEON STRL SZ8 (GLOVE) ×2 IMPLANT
GLOVE ECLIPSE 7.5 STRL STRAW (GLOVE) ×1 IMPLANT
GLOVE EXAM NITRILE LRG STRL (GLOVE) IMPLANT
GLOVE EXAM NITRILE MD LF STRL (GLOVE) IMPLANT
GLOVE EXAM NITRILE XL STR (GLOVE) IMPLANT
GLOVE EXAM NITRILE XS STR PU (GLOVE) IMPLANT
GLOVE INDICATOR 8.5 STRL (GLOVE) ×2 IMPLANT
GOWN BRE IMP SLV AUR LG STRL (GOWN DISPOSABLE) ×2 IMPLANT
GOWN BRE IMP SLV AUR XL STRL (GOWN DISPOSABLE) ×4 IMPLANT
GOWN STRL REIN 2XL LVL4 (GOWN DISPOSABLE) IMPLANT
KIT BASIN OR (CUSTOM PROCEDURE TRAY) ×2 IMPLANT
KIT ROOM TURNOVER OR (KITS) ×2 IMPLANT
NEEDLE HYPO 22GX1.5 SAFETY (NEEDLE) ×2 IMPLANT
NS IRRIG 1000ML POUR BTL (IV SOLUTION) ×2 IMPLANT
PACK LAMINECTOMY NEURO (CUSTOM PROCEDURE TRAY) ×2 IMPLANT
RUBBERBAND STERILE (MISCELLANEOUS) ×4 IMPLANT
SPONGE GAUZE 4X4 12PLY (GAUZE/BANDAGES/DRESSINGS) ×2 IMPLANT
SPONGE SURGIFOAM ABS GEL SZ50 (HEMOSTASIS) ×2 IMPLANT
STRIP CLOSURE SKIN 1/2X4 (GAUZE/BANDAGES/DRESSINGS) ×2 IMPLANT
SUT VIC AB 0 CT1 18XCR BRD8 (SUTURE) ×1 IMPLANT
SUT VIC AB 0 CT1 8-18 (SUTURE) ×2
SUT VIC AB 2-0 CT1 18 (SUTURE) ×2 IMPLANT
SUT VICRYL 4-0 PS2 18IN ABS (SUTURE) ×2 IMPLANT
SYR 20ML ECCENTRIC (SYRINGE) ×2 IMPLANT
TOWEL OR 17X24 6PK STRL BLUE (TOWEL DISPOSABLE) ×2 IMPLANT
TOWEL OR 17X26 10 PK STRL BLUE (TOWEL DISPOSABLE) ×2 IMPLANT
WATER STERILE IRR 1000ML POUR (IV SOLUTION) ×2 IMPLANT

## 2011-12-05 NOTE — Anesthesia Preprocedure Evaluation (Addendum)
Anesthesia Evaluation  Patient identified by MRN, date of birth, ID band Patient awake    Reviewed: Allergy & Precautions, H&P , NPO status , Patient's Chart, lab work & pertinent test results  Airway Mallampati: I TM Distance: >3 FB Neck ROM: Full    Dental  (+) Teeth Intact and Dental Advisory Given   Pulmonary neg pneumonia -, former smoker,  breath sounds clear to auscultation        Cardiovascular hypertension, Pt. on medications and Pt. on home beta blockers + CAD Rhythm:Regular Rate:Normal     Neuro/Psych negative neurological ROS  negative psych ROS   GI/Hepatic negative GI ROS, Neg liver ROS,   Endo/Other  negative endocrine ROS  Renal/GU negative Renal ROS     Musculoskeletal  (+) Arthritis -, Osteoarthritis,    Abdominal   Peds  Hematology negative hematology ROS (+)   Anesthesia Other Findings   Reproductive/Obstetrics                         Anesthesia Physical Anesthesia Plan  ASA: III  Anesthesia Plan: General   Post-op Pain Management:    Induction: Intravenous  Airway Management Planned: Oral ETT  Additional Equipment:   Intra-op Plan:   Post-operative Plan: Extubation in OR  Informed Consent: I have reviewed the patients History and Physical, chart, labs and discussed the procedure including the risks, benefits and alternatives for the proposed anesthesia with the patient or authorized representative who has indicated his/her understanding and acceptance.     Plan Discussed with: CRNA, Surgeon and Anesthesiologist  Anesthesia Plan Comments:        Anesthesia Quick Evaluation

## 2011-12-05 NOTE — Op Note (Signed)
Preoperative diagnosis: Left L5 radiculopathy from ruptured disc and lumbar spinal stenosis L4-5 severe left L5 foraminal stenosis  Postoperative diagnosis: Same  Procedure: Decompressive lumbar laminectomy L4-5 with microscopic dissection of the left L5 nerve remarks discectomy from the left  Surgeon: Jillyn Hidden Brailon Don  Asst.: Donnal Debar Kritzer  Anesthesia: General  EBL: Minimal  History of present illness: Patient is a very pleasant 78 year old gentleman who presents with drop and pain radiating to left hip and down the left leg. Imaging Reveals very large central and leftward disc herniation and marked facet arthropathy and severe hourglass compression thecal sac at L4-5 due to patient's clinical exam progressive clinical syndrome and imaging findings. Conservative treatment was recommended decompressive laminectomy and discectomy from the left at L4-5 risks benefits of the operation were the patient as well as  Course expectations of outcome of surgery and he agreed to proceed forward.  Operative procedure: Patient was brought into the or was induced under general anesthesia position probable superimposed back was prepped and draped in routine sterile fashion. Preoperative localizing appropriate level so after infiltration 10 cc light lidocaine with epi a midline incision was made and alert currently she is taken for tissue subperiosteal dissection currently not of L4 and L5 interoperative x-ray identified the appropriate level so than the ingress of L4 medial facet complex aggressive fibers until about a high-speed drill laminotomy was begun the ligament was unremarkable hypertrophied and was removed piecemeal fashion exposing the thecal sac thecal sac was under a lot of tension from the combination of marked facet arthropathy large Duracon of the thoracic of L4-5 facet complex the disc herniation from underneath. After the operating microscope was draped and brought into the field! Large ulcer that was  removed from the thoracic facet complex and decompressed 5 root posterior pedicle and then using a 4 Penfield the thecal sac was dissected off of the disc herniation still partially details ligament was cleaned out radically after an annulotomy was a little scalp. Pituitary rongeurs and Epstein curettes were all used to sew the discectomy. Transverse process on thecal sac or the left L5 nerve root was then copiously irrigated meticulous hemostasis was maintained Gelfoam was laid over the dura the muscle fascia proximal layers with Vicryl and skin was closed with 4 subcuticular benzoin surgeon patient comes in stable condition at the R. counts sponge counts were correct.

## 2011-12-05 NOTE — Anesthesia Postprocedure Evaluation (Signed)
  Anesthesia Post-op Note  Patient: Ricardo Soto  Procedure(s) Performed: Procedure(s) (LRB): LUMBAR LAMINECTOMY/DECOMPRESSION MICRODISCECTOMY 1 LEVEL (Bilateral)  Patient Location: PACU  Anesthesia Type: General  Level of Consciousness: awake, oriented, sedated and patient cooperative  Airway and Oxygen Therapy: Patient Spontanous Breathing and Patient connected to nasal cannula oxygen  Post-op Pain: mild  Post-op Assessment: Post-op Vital signs reviewed, Patient's Cardiovascular Status Stable, Respiratory Function Stable, Patent Airway, No signs of Nausea or vomiting and Pain level controlled  Post-op Vital Signs: stable  Complications: No apparent anesthesia complications

## 2011-12-05 NOTE — H&P (Signed)
Ricardo Soto is an 78 y.o. male.   Chief Complaint: neurogenic claudication  HPI: 78yo white male with longstanding back pain presents with progressively worsening difficulty walking with numbness and tingling in his feet and weakness of his left foot.   He has failed all forms of conservative treatment  And imaging studies reveal severe stenosis at L45 with centrakl HNP.   I have recommended decompression and diskectomy.  I have expolained the risks and benefits of the procedure, expectaionsof outcome and alternatives to surgery.  He understands and agrees to proceed forward.  Past Medical History  Diagnosis Date  . CAD (coronary artery disease)     hx of 2 stents 03/27/05  . HLD (hyperlipidemia)   . BPH (benign prostatic hypertrophy)   . Chronic kidney disease   . Pneumonia     as a child  . Cancer     left kidney  . Arthritis     Osteo    Past Surgical History  Procedure Date  . Appendectomy 1938  . Hernia repair 12/00  . Cataract extraction 1/01 and '04  . Transurethral resection of prostate 2004  . Left nephrectomy 1/05  . Left knee total arthroplasty 11/25/05  . Eye surgery   . Joint replacement     left hip; bilateral knee  . Cardiac catheterization     2 sents in LAD  . Coronary angioplasty     Family History  Problem Relation Age of Onset  . Heart disease      GP  . Heart attack      GP    Social History:  reports that he has quit smoking. He does not have any smokeless tobacco history on file. He reports that he drinks alcohol. He reports that he does not use illicit drugs.  Allergies: No Known Allergies  Medications Prior to Admission  Medication Sig Dispense Refill  . acetaminophen (TYLENOL) 325 MG tablet Take 325 mg by mouth every 6 (six) hours as needed. For pain      . aspirin 81 MG EC tablet Take 81 mg by mouth daily.        Marland Kitchen FOLIC ACID PO Take 1 tablet by mouth daily.      . Glucos-Chondroit-Hyaluron-D3 (TRIGOSAMINE MAX ST PO) Take 1-2  tablets by mouth daily.        Marland Kitchen lisinopril (PRINIVIL,ZESTRIL) 5 MG tablet Take 5 mg by mouth daily.        . metoprolol succinate (TOPROL-XL) 25 MG 24 hr tablet Take 25 mg by mouth at bedtime.      . Multiple Vitamin (MULTIVITAMIN) tablet Joint supplement  1 tab po qd      . Omega-3 Fatty Acids (FISH OIL CONCENTRATE PO) Take 1 tablet by mouth daily.         No results found for this or any previous visit (from the past 48 hour(s)). No results found.  Review of Systems  Constitutional: Negative.   HENT: Negative.   Eyes: Negative.   Respiratory: Negative.   Cardiovascular: Negative.   Gastrointestinal: Negative.   Genitourinary: Negative.   Musculoskeletal: Negative.   Skin: Negative.   Neurological: Negative.   Endo/Heme/Allergies: Negative.   Psychiatric/Behavioral: Negative.     Blood pressure 129/74, pulse 77, temperature 97.5 F (36.4 C), temperature source Oral, resp. rate 18, SpO2 96.00%. Physical Exam  Constitutional: He is oriented to person, place, and time. He appears well-developed.  HENT:  Head: Normocephalic.  Eyes: Pupils are equal, round, and  reactive to light.  Neck: Normal range of motion.  GI: Soft.  Musculoskeletal: Normal range of motion.  Neurological: He is alert and oriented to person, place, and time. GCS eye subscore is 4. GCS verbal subscore is 5. GCS motor subscore is 6.  Reflex Scores:      Patellar reflexes are 0 on the right side and 0 on the left side.      Achilles reflexes are 0 on the right side and 0 on the left side.      1/5 DORSIFLEXION and EHL. Otherwise 5/5     Assessment/Plan L45 Decompressive laminectomy  Nekia Maxham Soto 12/05/2011, 4:28 PM

## 2011-12-05 NOTE — Anesthesia Procedure Notes (Signed)
Procedure Name: Intubation Date/Time: 12/05/2011 4:43 PM Performed by: [place] Jon S Pre-anesthesia Checklist: Patient identified, Emergency Drugs available, Suction available, Patient being monitored and Timeout performed Patient Re-evaluated:Patient Re-evaluated prior to inductionOxygen Delivery Method: Circle system utilized Preoxygenation: Pre-oxygenation with 100% oxygen Intubation Type: IV induction Ventilation: Mask ventilation without difficulty Laryngoscope Size: Mac and 4 Grade View: Grade I Tube size: 8.0 mm Number of attempts: 1 Airway Equipment and Method: Stylet Placement Confirmation: ETT inserted through vocal cords under direct vision,  positive ETCO2 and breath sounds checked- equal and bilateral Secured at: 22 cm Tube secured with: Tape Dental Injury: Teeth and Oropharynx as per pre-operative assessment

## 2011-12-05 NOTE — Preoperative (Signed)
Beta Blockers   Reason not to administer Beta Blockers:Not Applicable, taken in pm

## 2011-12-05 NOTE — Transfer of Care (Signed)
Immediate Anesthesia Transfer of Care Note  Patient: Ricardo Soto  Procedure(s) Performed: Procedure(s) (LRB): LUMBAR LAMINECTOMY/DECOMPRESSION MICRODISCECTOMY 1 LEVEL (Bilateral)  Patient Location: PACU  Anesthesia Type: General  Level of Consciousness: awake, alert  and oriented  Airway & Oxygen Therapy: Patient Spontanous Breathing and Patient connected to nasal cannula oxygen  Post-op Assessment: Report given to PACU RN and Post -op Vital signs reviewed and stable  Post vital signs: Reviewed and stable  Complications: No apparent anesthesia complications

## 2011-12-06 ENCOUNTER — Encounter (HOSPITAL_COMMUNITY): Payer: Self-pay | Admitting: Neurosurgery

## 2011-12-06 MED ORDER — CYCLOBENZAPRINE HCL 10 MG PO TABS: 10.0000 mg | ORAL_TABLET | Freq: Three times a day (TID) | ORAL | Status: AC | PRN

## 2011-12-06 MED ORDER — OXYCODONE-ACETAMINOPHEN 5-325 MG PO TABS: 1.0000 | ORAL_TABLET | ORAL | Status: AC | PRN

## 2011-12-06 NOTE — Discharge Summary (Signed)
Physician Discharge Summary  Patient ID: Ricardo Soto MRN: [id number] DOB/AGE: 76-Oct-1933 76 y.o.  Admit date: 12/05/2011 Discharge date: 12/06/2011  Admission Diagnoses: Neurogenic claudication for lumbar spinal stenosis L4-5  Discharge Diagnoses: Same Active Problems:  * No active hospital problems. *    Discharged Condition: good  Hospital Course: The patient was made to the hospital underwent a decompressive laminectomy and discectomy from the left side at L4-5 postoperatively patient did very well and come the floor on the floor patient is convalescing well ambulatory voiding spontaneously tolerating her diet and able to be discharged home in scheduled followup in approximately 1-2 weeks. Patient be discharged muscular and Flexeril for pain  Consults:   Significant Diagnostic Studies:   Treatments: Aggressive lumbar a L4-5 discectomy from the left L4-5  Discharge Exam: Blood pressure 119/79, pulse 46, temperature 98.6 F (37 C), temperature source Oral, resp. rate 18, SpO2 94.00%. Dorsiflexion weakness and footdrop otherwise 5 out of 5  Disposition:  home   Medication List  As of 12/06/2011  9:26 AM   TAKE these medications         acetaminophen 325 MG tablet   Commonly known as: TYLENOL   Take 325 mg by mouth every 6 (six) hours as needed. For pain      aspirin 81 MG EC tablet   Take 81 mg by mouth daily.      cyclobenzaprine 10 MG tablet   Commonly known as: FLEXERIL   Take 1 tablet (10 mg total) by mouth 3 (three) times daily as needed for muscle spasms.      FISH OIL CONCENTRATE PO   Take 1 tablet by mouth daily.      FOLIC ACID PO   Take 1 tablet by mouth daily.      lisinopril 5 MG tablet   Commonly known as: PRINIVIL,ZESTRIL   Take 5 mg by mouth daily.      metoprolol succinate 25 MG 24 hr tablet   Commonly known as: TOPROL-XL   Take 25 mg by mouth at bedtime.      multivitamin tablet   Joint supplement   1 tab po qd     oxyCODONE-acetaminophen 5-325 MG per tablet   Commonly known as: PERCOCET/ROXICET   Take 1-2 tablets by mouth every 4 (four) hours as needed.      TRIGOSAMINE MAX ST PO   Take 1-2 tablets by mouth daily.             Signed: Tyrek Lawhorn P 12/06/2011, 9:26 AM

## 2011-12-06 NOTE — Progress Notes (Signed)
Patient ID: Ricardo Soto, male   DOB: 1931-04-18, 78 y.o.   MRN: [id number] Patient is postop day 1 doing very well no leg pain no nausea no vomiting and leading and voiding spontaneously. Neurologic exam is baseline with a foot drop on the left at slightly improved from preop his wound is clean and dry patient stable for discharge home.

## 2012-01-22 NOTE — Addendum Note (Signed)
Addended by: Reine Just on: 01/22/2012 09:37 AM   Modules accepted: Orders

## 2012-02-04 ENCOUNTER — Other Ambulatory Visit: Payer: Self-pay | Admitting: Cardiology

## 2012-03-10 ENCOUNTER — Other Ambulatory Visit: Payer: Self-pay | Admitting: Neurosurgery

## 2012-03-10 DIAGNOSIS — M48061 Spinal stenosis, lumbar region without neurogenic claudication: Secondary | ICD-10-CM

## 2012-03-10 DIAGNOSIS — M5137 Other intervertebral disc degeneration, lumbosacral region: Secondary | ICD-10-CM

## 2012-03-10 DIAGNOSIS — IMO0002 Reserved for concepts with insufficient information to code with codable children: Secondary | ICD-10-CM

## 2012-03-16 ENCOUNTER — Ambulatory Visit
Admission: RE | Admit: 2012-03-16 | Discharge: 2012-03-16 | Disposition: A | Discharge status: Home / Self Care | Payer: Medicare Other | Source: Ambulatory Visit | Attending: Neurosurgery | Admitting: Neurosurgery

## 2012-03-16 ENCOUNTER — Other Ambulatory Visit: Payer: Medicare Other

## 2012-03-16 DIAGNOSIS — IMO0002 Reserved for concepts with insufficient information to code with codable children: Secondary | ICD-10-CM

## 2012-03-16 DIAGNOSIS — M48061 Spinal stenosis, lumbar region without neurogenic claudication: Secondary | ICD-10-CM

## 2012-03-16 DIAGNOSIS — M5137 Other intervertebral disc degeneration, lumbosacral region: Secondary | ICD-10-CM

## 2012-03-16 MED ORDER — GADOBENATE DIMEGLUMINE 529 MG/ML IV SOLN
17.0000 mL | Freq: Once | INTRAVENOUS | Status: AC | PRN
  Administered 2012-03-16: 17 mL via INTRAVENOUS

## 2012-03-19 ENCOUNTER — Other Ambulatory Visit: Payer: Self-pay | Admitting: Neurosurgery

## 2012-03-20 ENCOUNTER — Encounter (HOSPITAL_COMMUNITY): Payer: Self-pay | Admitting: Respiratory Therapy

## 2012-03-25 ENCOUNTER — Encounter: Payer: Self-pay | Admitting: Cardiology

## 2012-03-26 ENCOUNTER — Encounter (HOSPITAL_COMMUNITY)
Admission: RE | Admit: 2012-03-26 | Discharge: 2012-03-26 | Disposition: A | Discharge status: Home / Self Care | Payer: Medicare Other | Source: Ambulatory Visit | Attending: Neurosurgery | Admitting: Neurosurgery

## 2012-03-26 ENCOUNTER — Encounter (HOSPITAL_COMMUNITY): Payer: Self-pay

## 2012-03-26 HISTORY — DX: Calculus of kidney: N20.0

## 2012-03-26 LAB — BASIC METABOLIC PANEL
BUN: 21 mg/dL (ref 6–23)
CO2: 27 mEq/L (ref 19–32)
Chloride: 101 mEq/L (ref 96–112)
Creatinine, Ser: 1.26 mg/dL (ref 0.50–1.35)
Glucose, Bld: 82 mg/dL (ref 70–99)

## 2012-03-26 LAB — CBC
HCT: 41.3 % (ref 39.0–52.0)
Hemoglobin: 13.5 g/dL (ref 13.0–17.0)
MCHC: 32.7 g/dL (ref 30.0–36.0)
MCV: 97.4 fL (ref 78.0–100.0)
RDW: 13.1 % (ref 11.5–15.5)

## 2012-03-26 NOTE — Pre-Procedure Instructions (Signed)
20 Ricardo Soto  03/26/2012   Your procedure is scheduled on:  Friday, December 27th  Report to [facility] at 5:30 AM.  Call this number if you have problems the morning of surgery: [phone]   Remember: Nothing to eat or drink after Midnight.      Take these medicines the morning of surgery with A SIP OF WATER: May take Oxycodone- Acetaminophen (Vicodin) or Tramadol (Ultram) if needed.    Do not wear jewelry, make-up or nail polish.  Do not wear lotions, powders, or perfumes. You may wear deodorant.  Do not shave 48 hours prior to surgery. Men may shave face and neck.  Do not bring valuables to the hospital.  Contacts, dentures or bridgework may not be worn into surgery.  Leave suitcase in the car. After surgery it may be brought to your room.  For patients admitted to the hospital, checkout time is 11:00 AM the day of discharge.   Patients discharged the day of surgery will not be allowed to drive home.  Name and phone number of your driver: NA   Special Instructions: Shower using CHG 2 nights before surgery and the night before surgery.  If you shower the day of surgery use CHG.  Use special wash - you have one bottle of CHG for all showers.  You should use approximately 1/3 of the bottle for each shower.   Please read over the following fact sheets that you were given: Pain Booklet, Coughing and Deep Breathing and Surgical Site Infection Prevention

## 2012-04-03 ENCOUNTER — Encounter (HOSPITAL_COMMUNITY)
Admission: RE | Disposition: A | Discharge status: Home / Self Care | Payer: Self-pay | Source: Ambulatory Visit | Attending: Neurosurgery

## 2012-04-03 ENCOUNTER — Encounter (HOSPITAL_COMMUNITY): Payer: Self-pay | Admitting: Anesthesiology

## 2012-04-03 ENCOUNTER — Ambulatory Visit (HOSPITAL_COMMUNITY)
Admission: RE | Admit: 2012-04-03 | Discharge: 2012-04-04 | Disposition: A | Discharge status: Home / Self Care | Payer: Medicare Other | Source: Ambulatory Visit | Attending: Neurosurgery | Admitting: Neurosurgery

## 2012-04-03 ENCOUNTER — Ambulatory Visit (HOSPITAL_COMMUNITY): Payer: Medicare Other | Admitting: Anesthesiology

## 2012-04-03 ENCOUNTER — Encounter (HOSPITAL_COMMUNITY): Payer: Self-pay | Admitting: *Deleted

## 2012-04-03 ENCOUNTER — Ambulatory Visit (HOSPITAL_COMMUNITY): Payer: Medicare Other

## 2012-04-03 DIAGNOSIS — M5126 Other intervertebral disc displacement, lumbar region: Secondary | ICD-10-CM | POA: Insufficient documentation

## 2012-04-03 DIAGNOSIS — I10 Essential (primary) hypertension: Secondary | ICD-10-CM | POA: Insufficient documentation

## 2012-04-03 DIAGNOSIS — Z01812 Encounter for preprocedural laboratory examination: Secondary | ICD-10-CM | POA: Insufficient documentation

## 2012-04-03 HISTORY — PX: LUMBAR LAMINECTOMY/DECOMPRESSION MICRODISCECTOMY: SHX5026

## 2012-04-03 SURGERY — LUMBAR LAMINECTOMY/DECOMPRESSION MICRODISCECTOMY 1 LEVEL
Anesthesia: General | Site: Back | Laterality: Left | Wound class: Clean

## 2012-04-03 MED ORDER — HYDROMORPHONE HCL PF 1 MG/ML IJ SOLN
INTRAMUSCULAR | Status: AC
  Filled 2012-04-03: qty 1

## 2012-04-03 MED ORDER — FENTANYL CITRATE 0.05 MG/ML IJ SOLN
INTRAMUSCULAR | Status: DC | PRN
  Administered 2012-04-03 (×2): 50 ug via INTRAVENOUS
  Administered 2012-04-03: 150 ug via INTRAVENOUS
  Administered 2012-04-03: 100 ug via INTRAVENOUS
  Administered 2012-04-03: 50 ug via INTRAVENOUS

## 2012-04-03 MED ORDER — HEMOSTATIC AGENTS (NO CHARGE) OPTIME
TOPICAL | Status: DC | PRN
  Administered 2012-04-03: 1 via TOPICAL

## 2012-04-03 MED ORDER — LIDOCAINE HCL (CARDIAC) 20 MG/ML IV SOLN
INTRAVENOUS | Status: DC | PRN
  Administered 2012-04-03: 80 mg via INTRAVENOUS

## 2012-04-03 MED ORDER — EPHEDRINE SULFATE 50 MG/ML IJ SOLN
INTRAMUSCULAR | Status: DC | PRN
  Administered 2012-04-03: 10 mg via INTRAVENOUS
  Administered 2012-04-03: 15 mg via INTRAVENOUS

## 2012-04-03 MED ORDER — LISINOPRIL 5 MG PO TABS
5.0000 mg | ORAL_TABLET | Freq: Every day | ORAL | Status: DC
  Administered 2012-04-03 – 2012-04-04 (×2): 5 mg via ORAL
  Filled 2012-04-03 (×2): qty 1

## 2012-04-03 MED ORDER — CYCLOBENZAPRINE HCL 10 MG PO TABS
10.0000 mg | ORAL_TABLET | Freq: Three times a day (TID) | ORAL | Status: DC | PRN
  Administered 2012-04-04: 10 mg via ORAL
  Filled 2012-04-03: qty 1

## 2012-04-03 MED ORDER — PROPOFOL 10 MG/ML IV BOLUS
INTRAVENOUS | Status: DC | PRN
  Administered 2012-04-03: 200 mg via INTRAVENOUS

## 2012-04-03 MED ORDER — SODIUM CHLORIDE 0.9 % IR SOLN
Status: DC | PRN
  Administered 2012-04-03: 07:00:00

## 2012-04-03 MED ORDER — LIDOCAINE HCL 4 % MT SOLN
OROMUCOSAL | Status: DC | PRN
  Administered 2012-04-03: 4 mL via TOPICAL

## 2012-04-03 MED ORDER — SODIUM CHLORIDE 0.9 % IV SOLN
INTRAVENOUS | Status: AC
  Filled 2012-04-03: qty 500

## 2012-04-03 MED ORDER — HYDROMORPHONE HCL PF 1 MG/ML IJ SOLN: 0.5000 mg | INTRAMUSCULAR | Status: DC | PRN

## 2012-04-03 MED ORDER — METOPROLOL SUCCINATE ER 25 MG PO TB24
25.0000 mg | ORAL_TABLET | Freq: Every day | ORAL | Status: DC
  Administered 2012-04-03: 25 mg via ORAL
  Filled 2012-04-03 (×2): qty 1

## 2012-04-03 MED ORDER — HYDROCODONE-ACETAMINOPHEN 5-325 MG PO TABS
1.0000 | ORAL_TABLET | Freq: Four times a day (QID) | ORAL | Status: DC | PRN
  Administered 2012-04-03: 2 via ORAL
  Filled 2012-04-03: qty 2

## 2012-04-03 MED ORDER — 0.9 % SODIUM CHLORIDE (POUR BTL) OPTIME
TOPICAL | Status: DC | PRN
  Administered 2012-04-03: 1000 mL

## 2012-04-03 MED ORDER — ATORVASTATIN CALCIUM 10 MG PO TABS
10.0000 mg | ORAL_TABLET | Freq: Every day | ORAL | Status: DC
  Administered 2012-04-03: 10 mg via ORAL
  Filled 2012-04-03 (×2): qty 1

## 2012-04-03 MED ORDER — ONDANSETRON HCL 4 MG/2ML IJ SOLN: 4.0000 mg | INTRAMUSCULAR | Status: DC | PRN

## 2012-04-03 MED ORDER — SODIUM CHLORIDE 0.9 % IJ SOLN: 3.0000 mL | INTRAMUSCULAR | Status: DC | PRN

## 2012-04-03 MED ORDER — PHENOL 1.4 % MT LIQD: 1.0000 | OROMUCOSAL | Status: DC | PRN

## 2012-04-03 MED ORDER — DOCUSATE SODIUM 100 MG PO CAPS
100.0000 mg | ORAL_CAPSULE | Freq: Two times a day (BID) | ORAL | Status: DC
  Administered 2012-04-03 – 2012-04-04 (×3): 100 mg via ORAL
  Filled 2012-04-03 (×3): qty 1

## 2012-04-03 MED ORDER — THROMBIN 5000 UNITS EX SOLR
CUTANEOUS | Status: DC | PRN
  Administered 2012-04-03 (×2): 5000 [IU] via TOPICAL

## 2012-04-03 MED ORDER — CEFAZOLIN SODIUM 1-5 GM-% IV SOLN
1.0000 g | Freq: Three times a day (TID) | INTRAVENOUS | Status: AC
  Administered 2012-04-03 (×2): 1 g via INTRAVENOUS
  Filled 2012-04-03 (×2): qty 50

## 2012-04-03 MED ORDER — BACITRACIN 50000 UNITS IM SOLR
INTRAMUSCULAR | Status: AC
  Filled 2012-04-03: qty 1

## 2012-04-03 MED ORDER — HYDROCODONE-ACETAMINOPHEN 5-325 MG PO TABS: 1.0000 | ORAL_TABLET | ORAL | Status: DC | PRN

## 2012-04-03 MED ORDER — HYDROMORPHONE HCL PF 1 MG/ML IJ SOLN
0.2500 mg | INTRAMUSCULAR | Status: DC | PRN
  Administered 2012-04-03 (×3): 0.5 mg via INTRAVENOUS

## 2012-04-03 MED ORDER — TRAMADOL HCL 50 MG PO TABS
50.0000 mg | ORAL_TABLET | Freq: Four times a day (QID) | ORAL | Status: DC | PRN
  Filled 2012-04-03: qty 1

## 2012-04-03 MED ORDER — MENTHOL 3 MG MT LOZG: 1.0000 | LOZENGE | OROMUCOSAL | Status: DC | PRN

## 2012-04-03 MED ORDER — CEFAZOLIN SODIUM-DEXTROSE 2-3 GM-% IV SOLR
INTRAVENOUS | Status: AC
  Administered 2012-04-03: 2 g via INTRAVENOUS
  Filled 2012-04-03: qty 50

## 2012-04-03 MED ORDER — SODIUM CHLORIDE 0.9 % IJ SOLN
3.0000 mL | Freq: Two times a day (BID) | INTRAMUSCULAR | Status: DC
  Administered 2012-04-03 (×2): 3 mL via INTRAVENOUS

## 2012-04-03 MED ORDER — ACETAMINOPHEN 650 MG RE SUPP: 650.0000 mg | RECTAL | Status: DC | PRN

## 2012-04-03 MED ORDER — ACETAMINOPHEN 325 MG PO TABS: 650.0000 mg | ORAL_TABLET | ORAL | Status: DC | PRN

## 2012-04-03 MED ORDER — OXYCODONE-ACETAMINOPHEN 5-325 MG PO TABS
1.0000 | ORAL_TABLET | ORAL | Status: DC | PRN
  Administered 2012-04-03 – 2012-04-04 (×4): 2 via ORAL
  Filled 2012-04-03 (×4): qty 2

## 2012-04-03 MED ORDER — LACTATED RINGERS IV SOLN
INTRAVENOUS | Status: DC | PRN
  Administered 2012-04-03 (×3): via INTRAVENOUS

## 2012-04-03 MED ORDER — PHENYLEPHRINE HCL 10 MG/ML IJ SOLN
INTRAMUSCULAR | Status: DC | PRN
  Administered 2012-04-03: 120 ug via INTRAVENOUS

## 2012-04-03 MED ORDER — PHENYLEPHRINE HCL 10 MG/ML IJ SOLN
10.0000 mg | INTRAVENOUS | Status: DC | PRN
  Administered 2012-04-03: 40 ug/min via INTRAVENOUS

## 2012-04-03 MED ORDER — ONDANSETRON HCL 4 MG/2ML IJ SOLN: 4.0000 mg | Freq: Once | INTRAMUSCULAR | Status: DC | PRN

## 2012-04-03 MED ORDER — ROCURONIUM BROMIDE 100 MG/10ML IV SOLN
INTRAVENOUS | Status: DC | PRN
  Administered 2012-04-03: 50 mg via INTRAVENOUS

## 2012-04-03 SURGICAL SUPPLY — 57 items
ADH SKN CLS APL DERMABOND .7 (GAUZE/BANDAGES/DRESSINGS) ×1
APL SKNCLS STERI-STRIP NONHPOA (GAUZE/BANDAGES/DRESSINGS) ×1
BAG DECANTER FOR FLEXI CONT (MISCELLANEOUS) ×2 IMPLANT
BENZOIN TINCTURE PRP APPL 2/3 (GAUZE/BANDAGES/DRESSINGS) ×2 IMPLANT
BLADE SURG 11 STRL SS (BLADE) ×2 IMPLANT
BLADE SURG ROTATE 9660 (MISCELLANEOUS) IMPLANT
BRUSH SCRUB EZ PLAIN DRY (MISCELLANEOUS) ×2 IMPLANT
BUR MATCHSTICK NEURO 3.0 LAGG (BURR) ×2 IMPLANT
BUR PRECISION FLUTE 6.0 (BURR) ×2 IMPLANT
CANISTER SUCTION 2500CC (MISCELLANEOUS) ×2 IMPLANT
CLOTH BEACON ORANGE TIMEOUT ST (SAFETY) ×2 IMPLANT
CONT SPEC 4OZ CLIKSEAL STRL BL (MISCELLANEOUS) ×2 IMPLANT
DECANTER SPIKE VIAL GLASS SM (MISCELLANEOUS) ×2 IMPLANT
DERMABOND ADVANCED (GAUZE/BANDAGES/DRESSINGS) ×1
DERMABOND ADVANCED .7 DNX12 (GAUZE/BANDAGES/DRESSINGS) ×1 IMPLANT
DRAPE LAPAROTOMY 100X72X124 (DRAPES) ×2 IMPLANT
DRAPE MICROSCOPE LEICA (MISCELLANEOUS) ×1 IMPLANT
DRAPE MICROSCOPE ZEISS OPMI (DRAPES) ×2 IMPLANT
DRAPE POUCH INSTRU U-SHP 10X18 (DRAPES) ×2 IMPLANT
DRAPE PROXIMA HALF (DRAPES) IMPLANT
DRAPE SURG 17X23 STRL (DRAPES) ×2 IMPLANT
DRSG OPSITE 4X5.5 SM (GAUZE/BANDAGES/DRESSINGS) ×2 IMPLANT
ELECT REM PT RETURN 9FT ADLT (ELECTROSURGICAL) ×2
ELECTRODE REM PT RTRN 9FT ADLT (ELECTROSURGICAL) ×1 IMPLANT
GAUZE SPONGE 4X4 16PLY XRAY LF (GAUZE/BANDAGES/DRESSINGS) IMPLANT
GLOVE BIO SURGEON STRL SZ8 (GLOVE) ×2 IMPLANT
GLOVE BIOGEL M 8.0 STRL (GLOVE) ×1 IMPLANT
GLOVE BIOGEL PI IND STRL 7.5 (GLOVE) IMPLANT
GLOVE BIOGEL PI INDICATOR 7.5 (GLOVE) ×3
GLOVE EXAM NITRILE LRG STRL (GLOVE) IMPLANT
GLOVE EXAM NITRILE MD LF STRL (GLOVE) IMPLANT
GLOVE EXAM NITRILE XL STR (GLOVE) IMPLANT
GLOVE EXAM NITRILE XS STR PU (GLOVE) IMPLANT
GLOVE INDICATOR 7.5 STRL GRN (GLOVE) ×2 IMPLANT
GLOVE INDICATOR 8.5 STRL (GLOVE) ×2 IMPLANT
GOWN BRE IMP SLV AUR LG STRL (GOWN DISPOSABLE) ×1 IMPLANT
GOWN BRE IMP SLV AUR XL STRL (GOWN DISPOSABLE) ×5 IMPLANT
GOWN STRL REIN 2XL LVL4 (GOWN DISPOSABLE) IMPLANT
KIT BASIN OR (CUSTOM PROCEDURE TRAY) ×2 IMPLANT
KIT ROOM TURNOVER OR (KITS) ×2 IMPLANT
NDL SPNL 22GX3.5 QUINCKE BK (NEEDLE) ×1 IMPLANT
NEEDLE HYPO 22GX1.5 SAFETY (NEEDLE) ×2 IMPLANT
NEEDLE SPNL 22GX3.5 QUINCKE BK (NEEDLE) ×2 IMPLANT
NS IRRIG 1000ML POUR BTL (IV SOLUTION) ×2 IMPLANT
PACK LAMINECTOMY NEURO (CUSTOM PROCEDURE TRAY) ×2 IMPLANT
RUBBERBAND STERILE (MISCELLANEOUS) ×4 IMPLANT
SPONGE GAUZE 4X4 12PLY (GAUZE/BANDAGES/DRESSINGS) ×2 IMPLANT
SPONGE SURGIFOAM ABS GEL SZ50 (HEMOSTASIS) ×2 IMPLANT
STRIP CLOSURE SKIN 1/2X4 (GAUZE/BANDAGES/DRESSINGS) ×2 IMPLANT
SUT VIC AB 0 CT1 18XCR BRD8 (SUTURE) ×1 IMPLANT
SUT VIC AB 0 CT1 8-18 (SUTURE) ×2
SUT VIC AB 2-0 CT1 18 (SUTURE) ×2 IMPLANT
SUT VICRYL 4-0 PS2 18IN ABS (SUTURE) ×2 IMPLANT
SYR 20ML ECCENTRIC (SYRINGE) ×2 IMPLANT
TOWEL OR 17X24 6PK STRL BLUE (TOWEL DISPOSABLE) ×2 IMPLANT
TOWEL OR 17X26 10 PK STRL BLUE (TOWEL DISPOSABLE) ×2 IMPLANT
WATER STERILE IRR 1000ML POUR (IV SOLUTION) ×2 IMPLANT

## 2012-04-03 NOTE — Plan of Care (Signed)
Problem: Consults Goal: Diagnosis - Spinal Surgery Outcome: Completed/Met Date Met:  04/03/12 Lumbar Laminectomy (Complex)

## 2012-04-03 NOTE — Anesthesia Preprocedure Evaluation (Addendum)
Anesthesia Evaluation  Patient identified by MRN, date of birth, ID band Patient awake    Reviewed: Allergy & Precautions, H&P , NPO status , Patient's Chart, lab work & pertinent test results, reviewed documented beta blocker date and time   Airway Mallampati: I TM Distance: >3 FB Neck ROM: full    Dental  (+) Teeth Intact and Dental Advisory Given   Pulmonary          Cardiovascular hypertension, Pt. on home beta blockers and Pt. on medications + CAD Rhythm:regular Rate:Normal     Neuro/Psych    GI/Hepatic   Endo/Other    Renal/GU Renal disease     Musculoskeletal   Abdominal   Peds  Hematology   Anesthesia Other Findings S/p nephrectomy  Reproductive/Obstetrics                          Anesthesia Physical Anesthesia Plan  ASA: III  Anesthesia Plan: General   Post-op Pain Management:    Induction: Intravenous  Airway Management Planned: Oral ETT  Additional Equipment:   Intra-op Plan:   Post-operative Plan: Extubation in OR  Informed Consent: I have reviewed the patients History and Physical, chart, labs and discussed the procedure including the risks, benefits and alternatives for the proposed anesthesia with the patient or authorized representative who has indicated his/her understanding and acceptance.     Plan Discussed with: CRNA, Anesthesiologist and Surgeon  Anesthesia Plan Comments:         Anesthesia Quick Evaluation

## 2012-04-03 NOTE — Discharge Summary (Signed)
  Physician Discharge Summary  Patient ID: Ricardo Soto MRN: [id number] DOB/AGE: 07-Jul-1931 78 y.o.  Admit date: 04/03/2012 Discharge date: 04/03/2012  Admission Diagnoses: Herniated nucleus pulposus L4-5 Same Discharge Diagnoses: Same Active Problems:  * No active hospital problems. *    Discharged Condition: good  Hospital Course: Patient was noted to the hospital underwent a redo laminectomy L4-5 and discectomy postoperatively patient did very well recovered in the floor on the floor patient was convalescing well ambulating and voiding spontaneously. Patient will be observed overnight and discharged in the morning once been cleared by the primary on call Dr.  Abner Greenspan: Significant Diagnostic Studies: Treatments: L4-5 redo laminectomy and discectomy Discharge Exam: Blood pressure 144/88, pulse 74, temperature 98.5 F (36.9 C), temperature source Oral, resp. rate 18, SpO2 95.00%. Strength out of 5 except for a baseline the left footdrop at 3/5  Disposition: Home     Medication List     As of 04/03/2012  3:24 PM    TAKE these medications         aspirin 81 MG EC tablet   Take 81 mg by mouth daily.      FISH OIL CONCENTRATE PO   Take 1 tablet by mouth daily.      FOLIC ACID PO   Take 1 tablet by mouth daily.      HYDROcodone-acetaminophen 5-325 MG per tablet   Commonly known as: NORCO/VICODIN   Take 1 tablet by mouth every 6 (six) hours as needed. For pain      lisinopril 5 MG tablet   Commonly known as: PRINIVIL,ZESTRIL   Take 5 mg by mouth daily.      metoprolol succinate 25 MG 24 hr tablet   Commonly known as: TOPROL-XL   Take 25 mg by mouth at bedtime.      multivitamin tablet   Joint supplement   1 tab po qd      rosuvastatin 20 MG tablet   Commonly known as: CRESTOR   Take 10 mg by mouth daily.      traMADol 50 MG tablet   Commonly known as: ULTRAM   Take 50 mg by mouth every 6 (six) hours as needed.         Signed: Kerstin Crusoe  P 04/03/2012, 3:24 PM

## 2012-04-03 NOTE — H&P (Addendum)
Ricardo Soto is an 78 y.o. male.   Chief Complaint: Back and left greater right leg pain HPI: Patient is a very pleasant 60 year old son who underwent decompressive laminectomy 2 months ago and initially did very well over last several weeks has had progressive worsening back and bilateral leg pain predominantly on the left but now lately has been progressing over to the right hip and leg. His is been an L4 and L5 nerve root pattern workup and imaging revealed a very large disc herniation L4-5 in 9 the setting of his previous decompressive laminectomy 2 size and location a disc herniation the severity of the spinal stenosis and thecal sac compression I recommended a redo laminectomy and discectomy x-rays were the risks benefits of the operation with him as well as perioperative course expectations about alternatives of surgery he understands and agrees to proceed forward. Workup did include flexion extension lumbar films which show no evidence of instability.  Past Medical History  Diagnosis Date  . CAD (coronary artery disease)     hx of 2 stents 03/27/05  . HLD (hyperlipidemia)   . BPH (benign prostatic hypertrophy)   . Chronic kidney disease   . Pneumonia     as a child  . Cancer     left kidney  . Arthritis     Osteo  . Kidney stone     Past Surgical History  Procedure Date  . Appendectomy 1938  . Hernia repair 12/00  . Cataract extraction 1/01 and '04  . Transurethral resection of prostate 2004  . Left nephrectomy 1/05  . Left knee total arthroplasty 11/25/05  . Joint replacement     left hip; bilateral knee  . Cardiac catheterization     2 sents in LAD  . Coronary angioplasty   . Lumbar laminectomy/decompression microdiscectomy 12/05/2011    Procedure: LUMBAR LAMINECTOMY/DECOMPRESSION MICRODISCECTOMY 1 LEVEL;  Surgeon: Mariam Dollar, MD;  Location: [facility];  Service: Neurosurgery;  Laterality: Bilateral;  lumbar four - five  . Eye surgery     CATARACT  . Hip  arthroplasty     Left  . Knee arthroplasty     Right    Family History  Problem Relation Age of Onset  . Heart disease      GP  . Heart attack      GP    Social History:  reports that he quit smoking about 29 years ago. He does not have any smokeless tobacco history on file. He reports that he drinks about 1.8 ounces of alcohol per week. He reports that he does not use illicit drugs.  Allergies: No Known Allergies  Medications Prior to Admission  Medication Sig Dispense Refill  . aspirin 81 MG EC tablet Take 81 mg by mouth daily.        Marland Kitchen FOLIC ACID PO Take 1 tablet by mouth daily.      Marland Kitchen HYDROcodone-acetaminophen (NORCO/VICODIN) 5-325 MG per tablet Take 1 tablet by mouth every 6 (six) hours as needed. For pain      . lisinopril (PRINIVIL,ZESTRIL) 5 MG tablet Take 5 mg by mouth daily.        . metoprolol succinate (TOPROL-XL) 25 MG 24 hr tablet Take 25 mg by mouth at bedtime.      . Multiple Vitamin (MULTIVITAMIN) tablet Joint supplement  1 tab po qd      . Omega-3 Fatty Acids (FISH OIL CONCENTRATE PO) Take 1 tablet by mouth daily.       Marland Kitchen  rosuvastatin (CRESTOR) 20 MG tablet Take 10 mg by mouth daily.      . traMADol (ULTRAM) 50 MG tablet Take 50 mg by mouth every 6 (six) hours as needed.        No results found for this or any previous visit (from the past 48 hour(s)). No results found.  Review of Systems  Constitutional: Negative.   HENT: Negative.   Eyes: Negative.   Respiratory: Negative.   Cardiovascular: Negative.   Gastrointestinal: Negative.   Genitourinary: Negative.   Musculoskeletal: Positive for back pain and joint pain.  Skin: Negative.   Neurological: Positive for tremors and sensory change.  Psychiatric/Behavioral: Negative.     Blood pressure 135/74, pulse 63, temperature 97.7 F (36.5 C), temperature source Oral, resp. rate 18, SpO2 97.00%. Physical Exam  Constitutional: He is oriented to person, place, and time. He appears well-developed and  well-nourished.  HENT:  Head: Normocephalic and atraumatic.  Eyes: Pupils are equal, round, and reactive to light.  Neck: Normal range of motion.  Cardiovascular: Normal rate.   Respiratory: Effort normal.  GI: Soft.  Musculoskeletal: Normal range of motion.  Neurological: He is alert and oriented to person, place, and time. He displays a negative Romberg sign. GCS eye subscore is 4. GCS verbal subscore is 5. GCS motor subscore is 6.  Reflex Scores:      Patellar reflexes are 0 on the right side and 0 on the left side.      Achilles reflexes are 0 on the right side and 0 on the left side.      Strength is 5 out of 5 in his iliopsoas, quads, and she's, gastrocs on into tibialis, and EHL on the right left EHL and left dorsiflexion is weak at about 2-3 out of 5     Assessment/Plan 78 year old gentleman presents for redo L4-5 laminectomy and discectomy extensor reviewed the risks and benefits of the operation as well as perioperative course and expectations of outcome alternatives of surgery and stands and agrees to proceed forward.  Debbera Wolken P 04/03/2012, 7:25 AM

## 2012-04-03 NOTE — Anesthesia Procedure Notes (Signed)
Procedure Name: Intubation Date/Time: 04/03/2012 7:44 AM Performed by: Alanda Amass A Pre-anesthesia Checklist: Patient identified, Timeout performed, Suction available, Patient being monitored and Emergency Drugs available Patient Re-evaluated:Patient Re-evaluated prior to inductionOxygen Delivery Method: Circle system utilized Preoxygenation: Pre-oxygenation with 100% oxygen Intubation Type: IV induction Ventilation: Mask ventilation without difficulty Laryngoscope Size: Mac and 3 Grade View: Grade I Tube type: Oral Tube size: 7.5 mm Number of attempts: 1 Airway Equipment and Method: Stylet Placement Confirmation: ETT inserted through vocal cords under direct vision,  breath sounds checked- equal and bilateral and positive ETCO2 Secured at: 22 cm Tube secured with: Tape Dental Injury: Teeth and Oropharynx as per pre-operative assessment

## 2012-04-03 NOTE — Anesthesia Postprocedure Evaluation (Signed)
  Anesthesia Post-op Note  Patient: Ricardo Soto  Procedure(s) Performed: Procedure(s) (LRB) with comments: LUMBAR LAMINECTOMY/DECOMPRESSION MICRODISCECTOMY 1 LEVEL (Left) - left lumbar four-five laminectony diskectomy  Patient Location: PACU  Anesthesia Type:General  Level of Consciousness: awake, alert , oriented and patient cooperative  Airway and Oxygen Therapy: Patient Spontanous Breathing  Post-op Pain: mild  Post-op Assessment: Post-op Vital signs reviewed, Patient's Cardiovascular Status Stable, Respiratory Function Stable, Patent Airway, No signs of Nausea or vomiting and Pain level controlled  Post-op Vital Signs: stable  Complications: No apparent anesthesia complications

## 2012-04-03 NOTE — Op Note (Signed)
Preoperative diagnosis: Herniated nucleus pulposus L4-5  Postoperative diagnosis: Same  Procedure: Redo lumbar laminectomy L4-5 for removal of herniated nucleus pulposus and decompression thecal sac with microdissection of the left L5 nerve root microscopic discectomy  Surgeon: Jillyn Hidden Dora Clauss  Assistant: Hilda Lias  Anesthesia: Gen.  EBL: Minimal  History of present illness: Patient is very pleasant 78 year old gentleman is at progressive worsening back and left greater right leg pain now for the last several weeks he is undergone a previous left-sided decompressive laminectomy about 3 or 4 months ago initially did very well however upon recurrence of his pain rehabilitation redo MRI scan showed a new. Large disc herniation L4-5 into the operative field causing severe spinal stenosis thecal sac compression. Due to the size and location fragment progression of clinical syndrome failed conservative treatment I recommended redo decompressive laminectomy with for evacuation removal of herniated disc. Since one of the wrist nevus of the operation as well as perioperative course expectations about alternatives surgery he understood and agreed to proceed forward.  Operative procedure: Patient was brought into the or was induced under general anesthesia positioned prone the Wilson frame his back was prepped and draped in routine sterile fashion. Incision was opened up and and the scar tissues dissected off of the laminotomy defect at L4-5 interoperative x-ray confirmed this to be the appropriate level so the scar tissues dissected off of the laminotomy defect and the undersurface of the native the lamina of L4 medial facet, suppressant of L5 was further dissected off of the scar and laminotomy was extended as well as the facetectomy was further under been to gain access the lateral margins dura normal anatomy. At this point the operating microscope was draped and brought into the field and under microscopic  illumination the L5 pedicle was identified the L5 nerve root was unroofed to decompress it out the foramen and working up underneath the L5 nerve root the scar tissue was used to free up the undersurface the L5 nerve root overlying a very large disc herniations still partially contained within the ligament this ligament was incised with 11 blade scalpel and a workup a several large free fragments were medially removed from the disc space this helped decompress the central canal proximal L5 nerve root. Then working further freeing up the scar tissue underneath the thecal sac and L5 nerve root several more large fragments disc removed and then working in the disc space with pituitary rongeurs Epstein curettes and nerve hook the disc spaces cleanout. At the discectomy I was able to pass a hockey stick and coronary dilator easily underneath the thecal sac all the way across the midline to the right side and felt no resistance the thecal sac was markedly decompressed and no stenosis was appreciated either on thecal sac or the L5 nerve root L5 foramen. It was in to see her good fixing space was maintained Gelfoam was laid over the dura the muscle fascia proximal in layers with after Vicryl and the skin was closed with a 4 running subcuticular. And again the case all needle counts and sponge counts were correct.

## 2012-04-03 NOTE — Transfer of Care (Signed)
Immediate Anesthesia Transfer of Care Note  Patient: Ricardo Soto  Procedure(s) Performed: Procedure(s) (LRB) with comments: LUMBAR LAMINECTOMY/DECOMPRESSION MICRODISCECTOMY 1 LEVEL (Left) - left lumbar four-five laminectony diskectomy  Patient Location: PACU  Anesthesia Type:General  Level of Consciousness: awake  Airway & Oxygen Therapy: Patient Spontanous Breathing and Patient connected to nasal cannula oxygen  Post-op Assessment: Report given to PACU RN and Post -op Vital signs reviewed and stable  Post vital signs: Reviewed and stable  Complications: No apparent anesthesia complications

## 2012-04-06 ENCOUNTER — Encounter (HOSPITAL_COMMUNITY): Payer: Self-pay | Admitting: Neurosurgery

## 2012-09-25 ENCOUNTER — Telehealth: Payer: Self-pay | Admitting: Cardiology

## 2012-09-25 NOTE — Telephone Encounter (Signed)
ERROR

## 2012-11-03 ENCOUNTER — Other Ambulatory Visit: Payer: Self-pay | Admitting: Cardiology

## 2012-11-05 ENCOUNTER — Other Ambulatory Visit: Payer: Self-pay | Admitting: *Deleted

## 2012-11-10 NOTE — Telephone Encounter (Signed)
A user error has taken place.

## 2012-11-26 ENCOUNTER — Encounter: Payer: Self-pay | Admitting: Cardiology

## 2012-11-26 ENCOUNTER — Ambulatory Visit (INDEPENDENT_AMBULATORY_CARE_PROVIDER_SITE_OTHER): Payer: Medicare Other | Admitting: Cardiology

## 2012-11-26 VITALS — BP 130/70 | HR 66 | Ht 71.0 in | Wt 190.0 lb

## 2012-11-26 DIAGNOSIS — E7800 Pure hypercholesterolemia, unspecified: Secondary | ICD-10-CM

## 2012-11-26 DIAGNOSIS — I10 Essential (primary) hypertension: Secondary | ICD-10-CM

## 2012-11-26 DIAGNOSIS — I2510 Atherosclerotic heart disease of native coronary artery without angina pectoris: Secondary | ICD-10-CM

## 2012-11-26 NOTE — Assessment & Plan Note (Signed)
Continue statin. 

## 2012-11-26 NOTE — Assessment & Plan Note (Signed)
Continue aspirin and statin. 

## 2012-11-26 NOTE — Progress Notes (Signed)
HPI: The patient is a pleasant gentleman who has a history of PCI of his LAD in December 2006 with a bare-metal stent. The patient has had previous abdominal ultrasound on October 31, 2005, that showed no aneurysm. His most recent Myoview was performed in August 2013. At that time, he was found to have no scar or ischemia. His ejection fraction was 65%. Renal dopplers in August 2013 showed s/p L nephrectomy; normal R renal artery and exophytic R renal cyst. I last saw him in July of 2013. Since then the patient denies any dyspnea on exertion, orthopnea, PND, pedal edema, palpitations, syncope or chest pain.    Current Outpatient Prescriptions  Medication Sig Dispense Refill  . acetaminophen (TYLENOL) 325 MG tablet Take 650 mg by mouth every 6 (six) hours as needed for pain.      Marland Kitchen aspirin 81 MG EC tablet Take 81 mg by mouth daily.        Marland Kitchen FOLIC ACID PO Take 1 tablet by mouth daily.      Marland Kitchen lisinopril (PRINIVIL,ZESTRIL) 5 MG tablet Take 5 mg by mouth daily.        . metoprolol succinate (TOPROL-XL) 25 MG 24 hr tablet TAKE 1 TABLET EVERY DAY  30 tablet  0  . Multiple Vitamin (MULTIVITAMIN) tablet Joint supplement  1 tab po qd      . NON FORMULARY Joint med daily      . Omega-3 Fatty Acids (FISH OIL CONCENTRATE PO) Take 1 tablet by mouth daily.       . rosuvastatin (CRESTOR) 20 MG tablet Take 10 mg by mouth daily.      . traMADol (ULTRAM) 50 MG tablet Take 50 mg by mouth every 6 (six) hours as needed.       No current facility-administered medications for this visit.     Past Medical History  Diagnosis Date  . CAD (coronary artery disease)     hx of 2 stents 03/27/05  . HLD (hyperlipidemia)   . BPH (benign prostatic hypertrophy)   . Chronic kidney disease   . Pneumonia     as a child  . Cancer     left kidney  . Arthritis     Osteo  . Kidney stone     Past Surgical History  Procedure Laterality Date  . Appendectomy  1938  . Hernia repair  12/00  . Cataract extraction  1/01 and  '04  . Transurethral resection of prostate  2004  . Left nephrectomy  1/05  . Left knee total arthroplasty  11/25/05  . Joint replacement      left hip; bilateral knee  . Cardiac catheterization      2 sents in LAD  . Coronary angioplasty    . Lumbar laminectomy/decompression microdiscectomy  12/05/2011    Procedure: LUMBAR LAMINECTOMY/DECOMPRESSION MICRODISCECTOMY 1 LEVEL;  Surgeon: Mariam Dollar, MD;  Location: [facility];  Service: Neurosurgery;  Laterality: Bilateral;  lumbar four - five  . Eye surgery      CATARACT  . Hip arthroplasty      Left  . Knee arthroplasty      Right  . Lumbar laminectomy/decompression microdiscectomy  04/03/2012    Procedure: LUMBAR LAMINECTOMY/DECOMPRESSION MICRODISCECTOMY 1 LEVEL;  Surgeon: Mariam Dollar, MD;  Location: [facility];  Service: Neurosurgery;  Laterality: Left;  left lumbar four-five laminectony diskectomy    History   Social History  . Marital Status: Widowed    Spouse Name: N/A  Number of Children: N/A  . Years of Education: N/A   Occupational History  . Not on file.   Social History Main Topics  . Smoking status: Former Smoker -- 25 years    Quit date: 11/07/1982  . Smokeless tobacco: Not on file     Comment: 1984  . Alcohol Use: 1.8 oz/week    1 Glasses of wine, 2 Cans of beer per week     Comment: 1-2 beers   . Drug Use: No  . Sexual Activity: Not on file   Other Topics Concern  . Not on file   Social History Narrative   Married, lives with wife; 2 children - son, 89; daughter, 26 - both alive   Retired - [employer] officer, [employer].     ROS: no fevers or chills, productive cough, hemoptysis, dysphasia, odynophagia, melena, hematochezia, dysuria, hematuria, rash, seizure activity, orthopnea, PND, pedal edema, claudication. Remaining systems are negative.  Physical Exam: Well-developed well-nourished in no acute distress.  Skin is warm and dry.  HEENT is normal.  Neck is supple.  Chest is clear to  auscultation with normal expansion.  Cardiovascular exam is regular rate and rhythm.  Abdominal exam nontender or distended. No masses palpated. Extremities show no edema. neuro grossly intact  ECG sinus rhythm at a rate of 66. No ST changes.

## 2012-11-26 NOTE — Patient Instructions (Addendum)
Your physician wants you to follow-up in: ONE YEAR WITH DR CRENSHAW You will receive a reminder letter in the mail two months in advance. If you don't receive a letter, please call our office to schedule the follow-up appointment.  

## 2012-11-26 NOTE — Assessment & Plan Note (Signed)
Blood pressure controlled. Continue present medications. 

## 2013-04-08 HISTORY — PX: KNEE ARTHROPLASTY: SHX992

## 2013-08-13 IMAGING — CR DG CHEST 2V
2 series · 2 of 2 positions shown · non-contrast
Comparison: 11/16/2008

CLINICAL DATA: Preop radiograph.  Lumbar laminectomy.

CHEST - 2 VIEW

[view not recorded (1 of 2)]
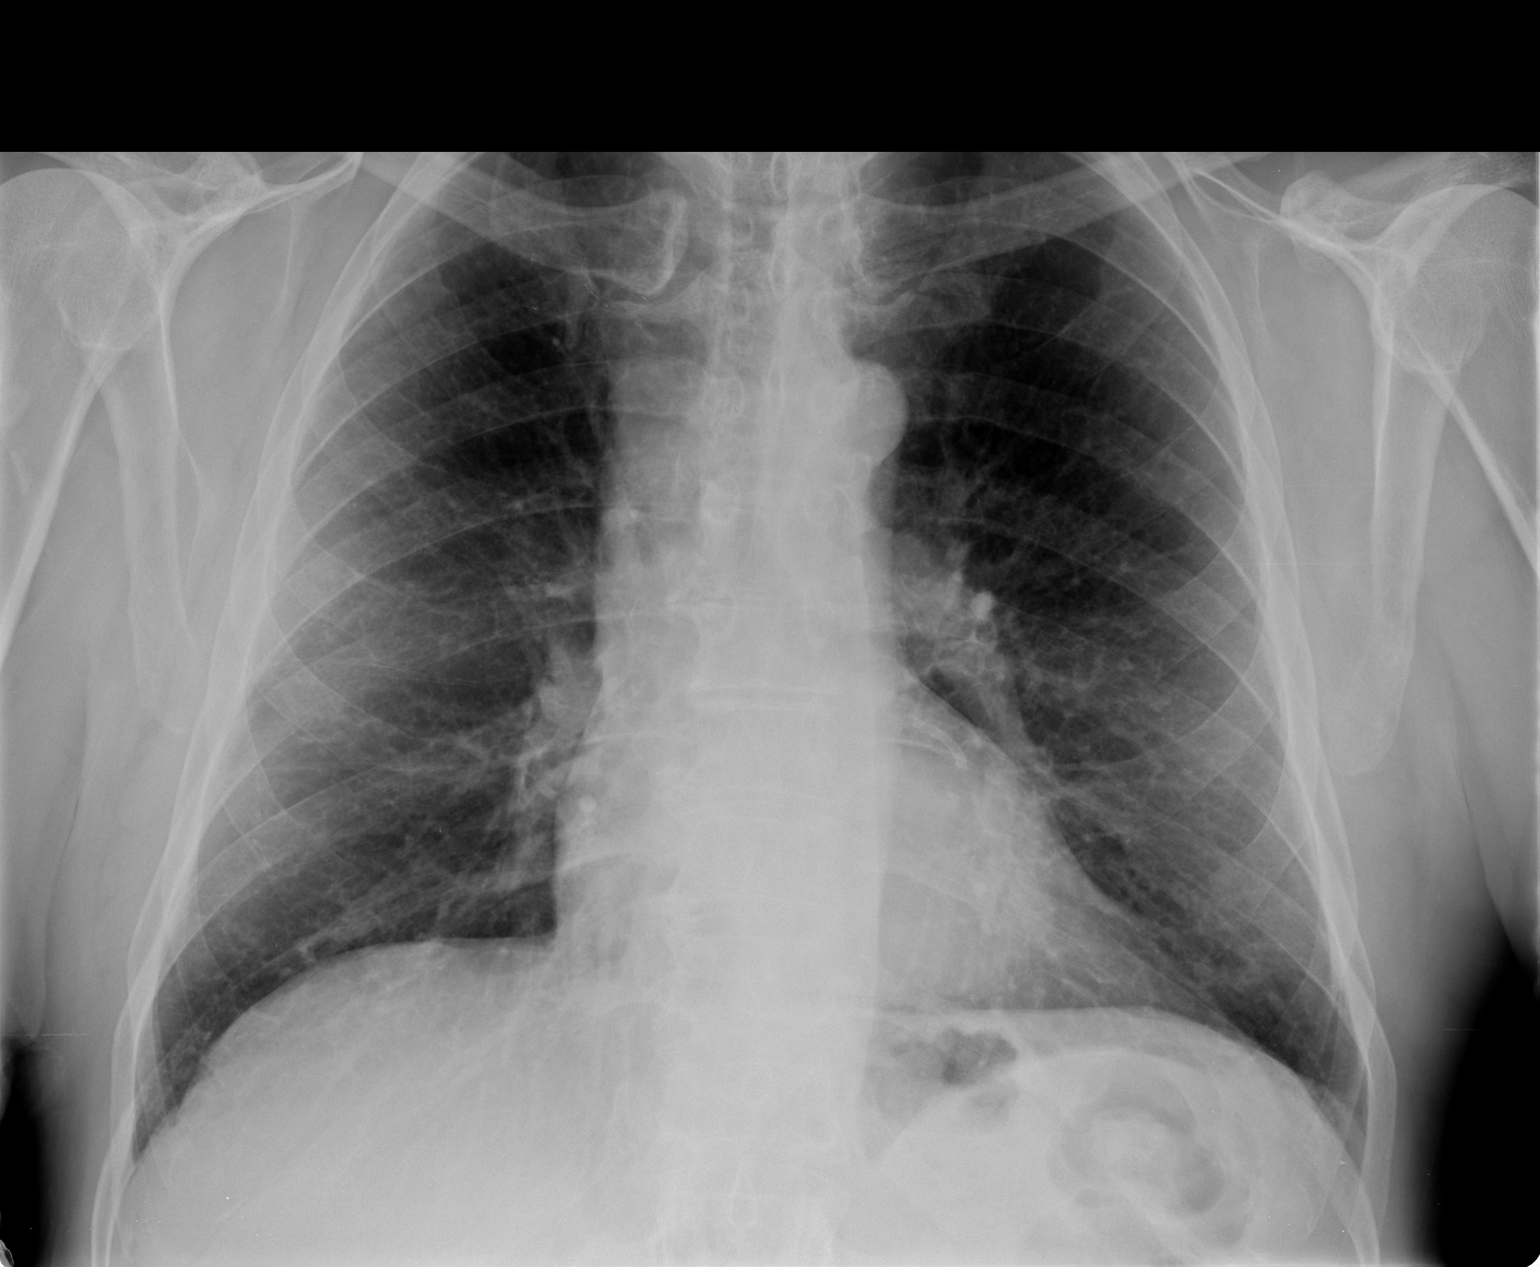

[view not recorded (2 of 2)]
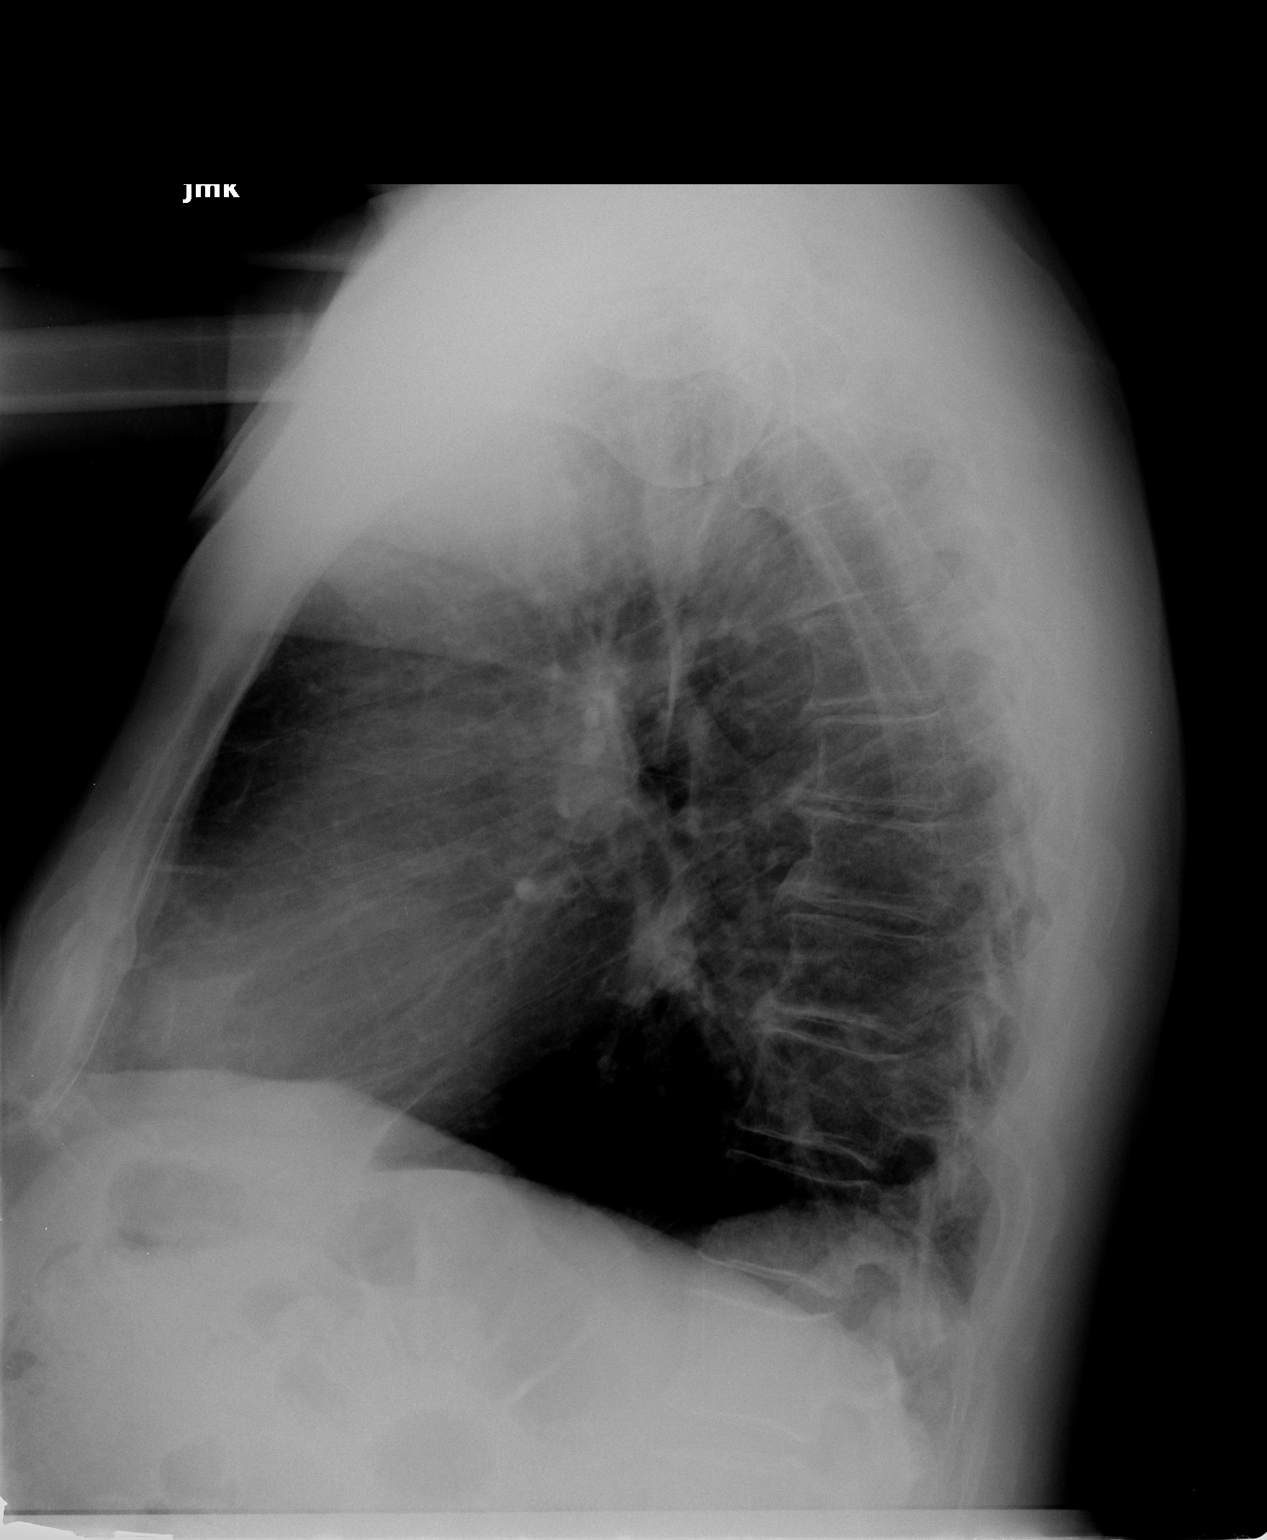

[2 of 2 positions shown; findings below may reference images not displayed]

FINDINGS: Heart size is normal.  No pleural effusion or edema
identified.  No airspace consolidation.

Review of the visualized osseous structures is significant for mild
thoracic spondylosis.
IMPRESSION: 1.  No acute cardiopulmonary abnormalities.
2.  Thoracic spondylosis noted.

## 2013-11-30 ENCOUNTER — Encounter: Payer: Self-pay | Admitting: Cardiology

## 2013-11-30 ENCOUNTER — Ambulatory Visit (INDEPENDENT_AMBULATORY_CARE_PROVIDER_SITE_OTHER): Payer: Commercial Managed Care - HMO | Admitting: Cardiology

## 2013-11-30 VITALS — BP 118/72 | HR 69 | Ht 71.0 in | Wt 184.0 lb

## 2013-11-30 DIAGNOSIS — I10 Essential (primary) hypertension: Secondary | ICD-10-CM

## 2013-11-30 DIAGNOSIS — I2510 Atherosclerotic heart disease of native coronary artery without angina pectoris: Secondary | ICD-10-CM

## 2013-11-30 DIAGNOSIS — E7800 Pure hypercholesterolemia, unspecified: Secondary | ICD-10-CM

## 2013-11-30 NOTE — Patient Instructions (Signed)
Your physician wants you to follow-up in: 6 MONTHS WITH DR CRENSHAW You will receive a reminder letter in the mail two months in advance. If you don't receive a letter, please call our office to schedule the follow-up appointment.  

## 2013-11-30 NOTE — Progress Notes (Signed)
HPI: FU CAD; history of PCI of his LAD in December 2006 with a bare-metal stent. The patient has had previous abdominal ultrasound on October 31, 2005, that showed no aneurysm. His most recent Myoview was performed in August 2013. At that time, he was found to have no scar or ischemia. His ejection fraction was 65%. Renal dopplers in August 2013 showed s/p L nephrectomy; normal R renal artery and exophytic R renal cyst. Since I last saw him, the patient denies any dyspnea on exertion, orthopnea, PND, pedal edema, palpitations, syncope or chest pain.   Current Outpatient Prescriptions  Medication Sig Dispense Refill  . acetaminophen (TYLENOL) 325 MG tablet Take 650 mg by mouth every 6 (six) hours as needed for pain.      Marland Kitchen aspirin 81 MG EC tablet Take 81 mg by mouth daily.        Marland Kitchen FOLIC ACID PO Take 1 tablet by mouth daily.      Marland Kitchen lisinopril (PRINIVIL,ZESTRIL) 5 MG tablet Take 5 mg by mouth daily.        . metoprolol succinate (TOPROL-XL) 25 MG 24 hr tablet TAKE 1 TABLET EVERY DAY  30 tablet  0  . Multiple Vitamin (MULTIVITAMIN) tablet Joint supplement  1 tab po qd      . NON FORMULARY Joint Supplements Daily      . Omega-3 Fatty Acids (FISH OIL CONCENTRATE PO) Take 1 tablet by mouth daily.       . rosuvastatin (CRESTOR) 20 MG tablet Take 10 mg by mouth daily.       No current facility-administered medications for this visit.     Past Medical History  Diagnosis Date  . CAD (coronary artery disease)     hx of 2 stents 03/27/05  . HLD (hyperlipidemia)   . BPH (benign prostatic hypertrophy)   . Chronic kidney disease   . Pneumonia     as a child  . Cancer     left kidney  . Arthritis     Osteo  . Kidney stone     Past Surgical History  Procedure Laterality Date  . Appendectomy  1938  . Hernia repair  12/00  . Cataract extraction  1/01 and '04  . Transurethral resection of prostate  2004  . Left nephrectomy  1/05  . Left knee total arthroplasty  11/25/05  . Joint  replacement      left hip; bilateral knee  . Cardiac catheterization      2 sents in LAD  . Coronary angioplasty    . Lumbar laminectomy/decompression microdiscectomy  12/05/2011    Procedure: LUMBAR LAMINECTOMY/DECOMPRESSION MICRODISCECTOMY 1 LEVEL;  Surgeon: Elaina Hoops, MD;  Location: [place] [facility];  Service: Neurosurgery;  Laterality: Bilateral;  lumbar four - five  . Eye surgery      CATARACT  . Hip arthroplasty      Left  . Knee arthroplasty      Right  . Lumbar laminectomy/decompression microdiscectomy  04/03/2012    Procedure: LUMBAR LAMINECTOMY/DECOMPRESSION MICRODISCECTOMY 1 LEVEL;  Surgeon: Elaina Hoops, MD;  Location: [place] [facility];  Service: Neurosurgery;  Laterality: Left;  left lumbar four-five laminectony diskectomy    History   Social History  . Marital Status: Widowed    Spouse Name: N/A    Number of Children: N/A  . Years of Education: N/A   Occupational History  . Not on file.   Social History Main Topics  . Smoking status: Former Smoker --  25 years    Quit date: 11/07/1982  . Smokeless tobacco: Not on file     Comment: 1984  . Alcohol Use: 1.8 oz/week    1 Glasses of wine, 2 Cans of beer per week     Comment: 1-2 beers   . Drug Use: No  . Sexual Activity: Not on file   Other Topics Concern  . Not on file   Social History Narrative   Married, lives with wife; 2 children - son, 8; daughter, 63 - both alive   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     ROS: Some residual pain from previous hip replacement but no fevers or chills, productive cough, hemoptysis, dysphasia, odynophagia, melena, hematochezia, dysuria, hematuria, rash, seizure activity, orthopnea, PND, pedal edema, claudication. Remaining systems are negative.  Physical Exam: Well-developed well-nourished in no acute distress.  Skin is warm and dry.  HEENT is normal.  Neck is supple.  Chest is clear to auscultation with normal expansion.  Cardiovascular exam is regular rate and rhythm.    Abdominal exam nontender or distended. No masses palpated. Extremities show no edema. neuro grossly intact  ECG Sinus rhythm at a rate of 69. No ST changes.

## 2013-11-30 NOTE — Assessment & Plan Note (Signed)
Continue aspirin and statin. 

## 2013-11-30 NOTE — Assessment & Plan Note (Signed)
Blood pressure controlled. Continue present medications. 

## 2013-11-30 NOTE — Assessment & Plan Note (Signed)
Continue statin. Check lipids and liver. 

## 2014-05-31 NOTE — Progress Notes (Signed)
HPI:  FU CAD; history of PCI of his LAD in December 2006 with a bare-metal stent. The patient has had previous abdominal ultrasound on October 31, 2005, that showed no aneurysm. His most recent Myoview was performed in August 2013. At that time, he was found to have no scar or ischemia. His ejection fraction was 65%. Renal dopplers in August 2013 showed s/p L nephrectomy; normal R renal artery and exophytic R renal cyst. Since I last saw him, the patient denies any dyspnea on exertion, orthopnea, PND, pedal edema, palpitations, syncope or chest pain.   Current Outpatient Prescriptions  Medication Sig Dispense Refill  . acetaminophen (TYLENOL) 500 MG tablet Take 500 mg by mouth as needed.    Marland Kitchen aspirin 81 MG EC tablet Take 81 mg by mouth daily.      . Coenzyme Q10 (CO Q-10) 50 MG CAPS Take by mouth.    . folic acid (FOLVITE) 1 MG tablet Take 1 mg by mouth daily.  3  . lisinopril (PRINIVIL,ZESTRIL) 5 MG tablet Take 5 mg by mouth daily.      . metoprolol succinate (TOPROL-XL) 25 MG 24 hr tablet TAKE 1 TABLET EVERY DAY 30 tablet 0  . Multiple Vitamin (MULTIVITAMIN) tablet Joint supplement  1 tab po qd    . Omega-3 Fatty Acids (FISH OIL CONCENTRATE PO) Take 1 tablet by mouth daily.     . rosuvastatin (CRESTOR) 20 MG tablet Take 10 mg by mouth daily.     No current facility-administered medications for this visit.     Past Medical History  Diagnosis Date  . CAD (coronary artery disease)     hx of 2 stents 03/27/05  . HLD (hyperlipidemia)   . BPH (benign prostatic hypertrophy)   . Chronic kidney disease   . Pneumonia     as a child  . Cancer     left kidney  . Arthritis     Osteo  . Kidney stone     Past Surgical History  Procedure Laterality Date  . Appendectomy  1938  . Hernia repair  12/00  . Cataract extraction  1/01 and '04  . Transurethral resection of prostate  2004  . Left nephrectomy  1/05  . Left knee total arthroplasty  11/25/05  . Joint replacement      left  hip; bilateral knee  . Cardiac catheterization      2 sents in LAD  . Coronary angioplasty    . Lumbar laminectomy/decompression microdiscectomy  12/05/2011    Procedure: LUMBAR LAMINECTOMY/DECOMPRESSION MICRODISCECTOMY 1 LEVEL;  Surgeon: Elaina Hoops, MD;  Location: [place] [facility];  Service: Neurosurgery;  Laterality: Bilateral;  lumbar four - five  . Eye surgery      CATARACT  . Hip arthroplasty      Left  . Knee arthroplasty      Right  . Lumbar laminectomy/decompression microdiscectomy  04/03/2012    Procedure: LUMBAR LAMINECTOMY/DECOMPRESSION MICRODISCECTOMY 1 LEVEL;  Surgeon: Elaina Hoops, MD;  Location: [place] [facility];  Service: Neurosurgery;  Laterality: Left;  left lumbar four-five laminectony diskectomy    History   Social History  . Marital Status: Widowed    Spouse Name: N/A  . Number of Children: N/A  . Years of Education: N/A   Occupational History  . Not on file.   Social History Main Topics  . Smoking status: Former Smoker -- 25 years    Quit date: 11/07/1982  . Smokeless tobacco: Not on file  Comment: 1984  . Alcohol Use: 1.8 oz/week    1 Glasses of wine, 2 Cans of beer per week     Comment: 1-2 beers   . Drug Use: No  . Sexual Activity: Not on file   Other Topics Concern  . Not on file   Social History Narrative   Married, lives with wife; 2 children - son, 47; daughter, 66 - both alive   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     ROS: problems with foot drop but no fevers or chills, productive cough, hemoptysis, dysphasia, odynophagia, melena, hematochezia, dysuria, hematuria, rash, seizure activity, orthopnea, PND, pedal edema, claudication. Remaining systems are negative.  Physical Exam: Well-developed well-nourished in no acute distress.  Skin is warm and dry.  HEENT is normal.  Neck is supple.  Chest is clear to auscultation with normal expansion.  Cardiovascular exam is regular rate and rhythm.  Abdominal exam nontender or distended. No  masses palpated. Extremities show no edema. neuro grossly intact  ECG sinus rhythm at a rate of 62. No ST changes.

## 2014-06-01 ENCOUNTER — Encounter: Payer: Self-pay | Admitting: Cardiology

## 2014-06-01 ENCOUNTER — Ambulatory Visit (INDEPENDENT_AMBULATORY_CARE_PROVIDER_SITE_OTHER): Payer: PPO | Admitting: Cardiology

## 2014-06-01 VITALS — BP 102/64 | HR 62 | Ht 71.0 in | Wt 195.4 lb

## 2014-06-01 DIAGNOSIS — E7800 Pure hypercholesterolemia, unspecified: Secondary | ICD-10-CM

## 2014-06-01 DIAGNOSIS — I10 Essential (primary) hypertension: Secondary | ICD-10-CM

## 2014-06-01 DIAGNOSIS — I2510 Atherosclerotic heart disease of native coronary artery without angina pectoris: Secondary | ICD-10-CM

## 2014-06-01 NOTE — Assessment & Plan Note (Signed)
Blood pressure control. Continue present medications. Potassium and renal function monitored by primary care.

## 2014-06-01 NOTE — Assessment & Plan Note (Signed)
Continue aspirin and statin. I will most likely schedule a functional study when he returns in 1 year.

## 2014-06-01 NOTE — Assessment & Plan Note (Signed)
Continue statin. Lipids and liver monitored by primary care. 

## 2014-06-01 NOTE — Patient Instructions (Signed)
Your physician wants you to follow-up in: ONE YEAR WITH DR CRENSHAW You will receive a reminder letter in the mail two months in advance. If you don't receive a letter, please call our office to schedule the follow-up appointment.  

## 2014-06-02 ENCOUNTER — Encounter: Payer: Self-pay | Admitting: Cardiology

## 2015-03-12 ENCOUNTER — Encounter: Payer: Self-pay | Admitting: Physician Assistant

## 2015-03-12 ENCOUNTER — Inpatient Hospital Stay (HOSPITAL_COMMUNITY)
Admission: AD | Admit: 2015-03-12 | Discharge: 2015-03-17 | DRG: 175 | Disposition: A | Discharge status: Home / Self Care | Payer: PPO | Source: Other Acute Inpatient Hospital | Attending: Internal Medicine | Admitting: Internal Medicine

## 2015-03-12 DIAGNOSIS — N183 Chronic kidney disease, stage 3 unspecified: Secondary | ICD-10-CM | POA: Diagnosis present

## 2015-03-12 DIAGNOSIS — M1990 Unspecified osteoarthritis, unspecified site: Secondary | ICD-10-CM | POA: Diagnosis present

## 2015-03-12 DIAGNOSIS — I2609 Other pulmonary embolism with acute cor pulmonale: Secondary | ICD-10-CM | POA: Diagnosis not present

## 2015-03-12 DIAGNOSIS — Z87891 Personal history of nicotine dependence: Secondary | ICD-10-CM

## 2015-03-12 DIAGNOSIS — Z96653 Presence of artificial knee joint, bilateral: Secondary | ICD-10-CM | POA: Diagnosis present

## 2015-03-12 DIAGNOSIS — I2510 Atherosclerotic heart disease of native coronary artery without angina pectoris: Secondary | ICD-10-CM | POA: Insufficient documentation

## 2015-03-12 DIAGNOSIS — I248 Other forms of acute ischemic heart disease: Secondary | ICD-10-CM | POA: Diagnosis present

## 2015-03-12 DIAGNOSIS — N179 Acute kidney failure, unspecified: Secondary | ICD-10-CM | POA: Diagnosis present

## 2015-03-12 DIAGNOSIS — I82442 Acute embolism and thrombosis of left tibial vein: Secondary | ICD-10-CM | POA: Diagnosis present

## 2015-03-12 DIAGNOSIS — N400 Enlarged prostate without lower urinary tract symptoms: Secondary | ICD-10-CM | POA: Diagnosis present

## 2015-03-12 DIAGNOSIS — I82432 Acute embolism and thrombosis of left popliteal vein: Secondary | ICD-10-CM | POA: Diagnosis present

## 2015-03-12 DIAGNOSIS — I10 Essential (primary) hypertension: Secondary | ICD-10-CM | POA: Diagnosis not present

## 2015-03-12 DIAGNOSIS — I82412 Acute embolism and thrombosis of left femoral vein: Secondary | ICD-10-CM | POA: Diagnosis present

## 2015-03-12 DIAGNOSIS — R7989 Other specified abnormal findings of blood chemistry: Secondary | ICD-10-CM | POA: Diagnosis present

## 2015-03-12 DIAGNOSIS — E785 Hyperlipidemia, unspecified: Secondary | ICD-10-CM | POA: Diagnosis not present

## 2015-03-12 DIAGNOSIS — Z96642 Presence of left artificial hip joint: Secondary | ICD-10-CM | POA: Diagnosis present

## 2015-03-12 DIAGNOSIS — R339 Retention of urine, unspecified: Secondary | ICD-10-CM | POA: Diagnosis present

## 2015-03-12 DIAGNOSIS — I2699 Other pulmonary embolism without acute cor pulmonale: Principal | ICD-10-CM | POA: Diagnosis present

## 2015-03-12 DIAGNOSIS — E7800 Pure hypercholesterolemia, unspecified: Secondary | ICD-10-CM | POA: Diagnosis present

## 2015-03-12 DIAGNOSIS — Z955 Presence of coronary angioplasty implant and graft: Secondary | ICD-10-CM | POA: Diagnosis not present

## 2015-03-12 DIAGNOSIS — Z905 Acquired absence of kidney: Secondary | ICD-10-CM | POA: Insufficient documentation

## 2015-03-12 DIAGNOSIS — I25118 Atherosclerotic heart disease of native coronary artery with other forms of angina pectoris: Secondary | ICD-10-CM | POA: Diagnosis present

## 2015-03-12 DIAGNOSIS — I5031 Acute diastolic (congestive) heart failure: Secondary | ICD-10-CM | POA: Diagnosis present

## 2015-03-12 DIAGNOSIS — Z85528 Personal history of other malignant neoplasm of kidney: Secondary | ICD-10-CM

## 2015-03-12 DIAGNOSIS — I214 Non-ST elevation (NSTEMI) myocardial infarction: Secondary | ICD-10-CM | POA: Diagnosis not present

## 2015-03-12 DIAGNOSIS — R0602 Shortness of breath: Secondary | ICD-10-CM

## 2015-03-12 DIAGNOSIS — I130 Hypertensive heart and chronic kidney disease with heart failure and stage 1 through stage 4 chronic kidney disease, or unspecified chronic kidney disease: Secondary | ICD-10-CM | POA: Diagnosis present

## 2015-03-12 DIAGNOSIS — I82492 Acute embolism and thrombosis of other specified deep vein of left lower extremity: Secondary | ICD-10-CM | POA: Diagnosis present

## 2015-03-12 DIAGNOSIS — M7989 Other specified soft tissue disorders: Secondary | ICD-10-CM | POA: Diagnosis not present

## 2015-03-12 DIAGNOSIS — Z9849 Cataract extraction status, unspecified eye: Secondary | ICD-10-CM | POA: Diagnosis not present

## 2015-03-12 DIAGNOSIS — R0600 Dyspnea, unspecified: Secondary | ICD-10-CM | POA: Diagnosis not present

## 2015-03-12 HISTORY — DX: Chronic kidney disease, stage 3 (moderate): N18.3

## 2015-03-12 HISTORY — DX: Essential (primary) hypertension: I10

## 2015-03-12 HISTORY — DX: Chronic kidney disease, stage 3 unspecified: N18.30

## 2015-03-12 HISTORY — DX: Other pulmonary embolism without acute cor pulmonale: I26.99

## 2015-03-12 LAB — TSH: TSH: 4.905 u[IU]/mL — ABNORMAL HIGH (ref 0.350–4.500)

## 2015-03-12 LAB — PROTIME-INR
INR: 1.17 (ref 0.00–1.49)
Prothrombin Time: 15 seconds (ref 11.6–15.2)

## 2015-03-12 LAB — TROPONIN I: TROPONIN I: 0.31 ng/mL — AB (ref <0.031)

## 2015-03-12 LAB — APTT: aPTT: 35 seconds (ref 24–37)

## 2015-03-12 MED ORDER — NITROGLYCERIN 0.4 MG SL SUBL: 0.4000 mg | SUBLINGUAL_TABLET | SUBLINGUAL | Status: DC | PRN

## 2015-03-12 MED ORDER — SODIUM CHLORIDE 0.9 % IJ SOLN
3.0000 mL | Freq: Two times a day (BID) | INTRAMUSCULAR | Status: DC
  Administered 2015-03-15 – 2015-03-17 (×4): 3 mL via INTRAVENOUS

## 2015-03-12 MED ORDER — OMEGA-3-ACID ETHYL ESTERS 1 G PO CAPS
1.0000 g | ORAL_CAPSULE | Freq: Every day | ORAL | Status: DC
Start: 2015-03-12 — End: 2015-03-17
  Administered 2015-03-12 – 2015-03-17 (×6): 1 g via ORAL
  Filled 2015-03-12 (×6): qty 1

## 2015-03-12 MED ORDER — SODIUM CHLORIDE 0.9 % IV SOLN
INTRAVENOUS | Status: DC
  Administered 2015-03-13: 06:00:00 via INTRAVENOUS

## 2015-03-12 MED ORDER — ACETAMINOPHEN 325 MG PO TABS: 650.0000 mg | ORAL_TABLET | ORAL | Status: DC | PRN

## 2015-03-12 MED ORDER — SODIUM CHLORIDE 0.9 % IV SOLN: 250.0000 mL | INTRAVENOUS | Status: DC | PRN

## 2015-03-12 MED ORDER — HEPARIN BOLUS VIA INFUSION
4000.0000 [IU] | Freq: Once | INTRAVENOUS | Status: AC
  Administered 2015-03-12: 4000 [IU] via INTRAVENOUS
  Filled 2015-03-12: qty 4000

## 2015-03-12 MED ORDER — ASPIRIN 81 MG PO TBEC
81.0000 mg | DELAYED_RELEASE_TABLET | Freq: Every day | ORAL | Status: DC
  Administered 2015-03-14: 81 mg via ORAL
  Filled 2015-03-12 (×3): qty 1

## 2015-03-12 MED ORDER — ASPIRIN 81 MG PO CHEW
81.0000 mg | CHEWABLE_TABLET | ORAL | Status: AC
  Administered 2015-03-13: 81 mg via ORAL
  Filled 2015-03-12: qty 1

## 2015-03-12 MED ORDER — ROSUVASTATIN CALCIUM 10 MG PO TABS
10.0000 mg | ORAL_TABLET | Freq: Every day | ORAL | Status: DC
  Administered 2015-03-12 – 2015-03-17 (×6): 10 mg via ORAL
  Filled 2015-03-12 (×6): qty 1

## 2015-03-12 MED ORDER — METOPROLOL SUCCINATE ER 25 MG PO TB24
25.0000 mg | ORAL_TABLET | Freq: Every day | ORAL | Status: DC
  Administered 2015-03-12 – 2015-03-17 (×6): 25 mg via ORAL
  Filled 2015-03-12 (×6): qty 1

## 2015-03-12 MED ORDER — ADULT MULTIVITAMIN W/MINERALS CH
1.0000 | ORAL_TABLET | Freq: Every day | ORAL | Status: DC
  Administered 2015-03-12 – 2015-03-17 (×6): 1 via ORAL
  Filled 2015-03-12 (×6): qty 1

## 2015-03-12 MED ORDER — HEPARIN (PORCINE) IN NACL 100-0.45 UNIT/ML-% IJ SOLN
1100.0000 [IU]/h | INTRAMUSCULAR | Status: AC
  Administered 2015-03-12 – 2015-03-13 (×2): 950 [IU]/h via INTRAVENOUS
  Filled 2015-03-12 (×2): qty 250

## 2015-03-12 MED ORDER — SODIUM CHLORIDE 0.9 % IJ SOLN: 3.0000 mL | INTRAMUSCULAR | Status: DC | PRN

## 2015-03-12 MED ORDER — ONDANSETRON HCL 4 MG/2ML IJ SOLN: 4.0000 mg | Freq: Four times a day (QID) | INTRAMUSCULAR | Status: DC | PRN

## 2015-03-12 MED ORDER — FOLIC ACID 1 MG PO TABS
1.0000 mg | ORAL_TABLET | Freq: Every day | ORAL | Status: DC
  Administered 2015-03-12 – 2015-03-17 (×6): 1 mg via ORAL
  Filled 2015-03-12 (×6): qty 1

## 2015-03-12 MED ORDER — ONE-DAILY MULTI VITAMINS PO TABS: 1.0000 | ORAL_TABLET | Freq: Every day | ORAL | Status: DC

## 2015-03-12 NOTE — Progress Notes (Signed)
ANTICOAGULATION CONSULT NOTE - Initial Consult  Pharmacy Consult for heparin Indication: chest pain/ACS  No Known Allergies  Patient Measurements: Height: 5\' 11"  (180.3 cm) Weight: 180 lb 1.6 oz (81.693 kg) IBW/kg (Calculated) : 75.3 Heparin Dosing Weight: 82 kg  Vital Signs: Temp: 97.7 F (36.5 C) (12/04 1628) Temp Source: Oral (12/04 1628) BP: 112/74 mmHg (12/04 1628) Pulse Rate: 114 (12/04 1628)  Labs: No results for input(s): HGB, HCT, PLT, APTT, LABPROT, INR, HEPARINUNFRC, CREATININE, CKTOTAL, CKMB, TROPONINI in the last 72 hours.  CrCl cannot be calculated (Patient has no serum creatinine result on file.).  Assessment: 81 yo male presented to [facility] ED with SOB. Trop @ OSH 0.37, pBNP [id number]  PMH: CAD, HLD, BPH, renal Ca s/p L Nephrectomy, CKD, HTN  AC: none PTA  CV: ACS with + trop. LHC in AM? ECHO Pending  Renal: Hx of L nephrectomy - watch as pt may be at increased risk for contrast-related injury  Heme: H&H 12.5/37.1, Plt 191. INR 1.1  Goal of Therapy:  Heparin level 0.3-0.7 units/ml Monitor platelets by anticoagulation protocol: Yes   Plan:  Heparin bolus 4000 units followed by an infusion of 950 units/hr HL at 0000 Daily HL, CBC  Monitor s/sx of bleeding F/U after cath  Levester Fresh, PharmD, BCPS, [facility] Clinical Pharmacist Pager [phone] 03/12/2015 5:33 PM

## 2015-03-12 NOTE — Progress Notes (Signed)
Sent staff message to early AM team Suanne Marker Barrett, Birdie Sons) to make team aware to see him early to make sure OK for cath.  Please note if the decision is made not to cath him tomorrow for some reason, the team will need to decide what to do about gentle IV hydration - on 50cc/hr as of 6am per d/w MD.  Melina Copa PA-C

## 2015-03-12 NOTE — H&P (Signed)
CARDIOLOGY ADMIT NOTE     Primary Care Physician: Mateo Flow, MD Primary Cardiologist:  Dr Stanford Breed  Admit Date: 03/12/2015  Reason for consultation:  SOB Mr. Natividad is an 81 y/o M with history of CAD s/p overlapping BMS-LAD 03/2005, HLD, BPH, renal CA s/p left nephrectomy, CKD, HTN, remote tobacco abuse who presented to [facility] ER today with SOB. Last cath appears to be at time of PCI in 2006 when he had PCI to LAD; otherwise had normal Cx and nonobstructive disease in RCA (30-40% multiple discrete lesions prox RCa, 50% mRCA). Last nuc 10/2008 was normal, EF 59%. Cr in 04/2014 was 1.5. He presented to [facility] ER today with SOB.  He reports being in good health and very healthy until recently.  Several weeks ago, he developed URI symptoms.   These have slowly improved with treatment of an antibiotic.  He noticed on Friday that he had abrupt onset of exertional SOB.  This was noticed when climbing stairs.  He denies associated chest pain or other symptoms.  On Saturday, he noticed that he had dyspnea when walking up stairs.  He also noticed progressive SOB with activity.  His symptoms would resolve within sitting for a minute.  Overnight, his symptoms have worsened.  He reports extreme breathlessness and a sense that he might pass out because of SOB.  He reports having no sob with resting or lying down.  He had the exact same symptoms in 2006 at which time he had his stents placed.  He has chronic BLE edema.  He thinks that venous insufficiency may be a little worse as he has not been as active over the past few weeks.  He has not noticed weight gain.  At [facility], he was noted to be tachycardic in the 110s. Labwork @ [facility] revealed WBC 9.2, Hgb 12.5, Hct 37.1, Plt 191, Na 140, K 4.7, Cl 106, CO2 21, BUN 39, Cr 1.9, glucose 121. PH 7.480, PCo2 26, PO2 62, Bicarb 19. LFTs WNL. Troponin 0.37 (ref range <0.04), pBNP 24200 (normal 0-1800), albumin 3.7, IN R 1.1. CXR: "coarsened perihilar  and bibasilar interstitial markings, with perhaps mild central pulm vascular congestion suggesting edema related to either bronchitic change or mild volume overload. No PNA. No Pleural effusion. Hyperexpanded lungs suggestive of COPD." He received 325mg  aspirin prior to transfer too. Consultation note indicates giving him 40mg  IV Lasix prior to transfer but MAR reports a dose of 20mg  IV at 11:27 (there is also a 60mg  dose listed but says Not Given).  Today, he denies symptoms of palpitations, chest pain, orthopnea, PND,  presyncope, syncope, or neurologic sequela. The patient is tolerating medications without difficulties and is otherwise without complaint today.   Past Medical History  Diagnosis Date  . CAD (coronary artery disease)     a. 2006: s/p overlapping BMS-LAD 03/2005.otherwise had normal Cx and nonobstructive disease in RCA (30-40% multiple discrete lesions prox RCa, 50% mRCA). b. Neg nuc 2010.  Marland Kitchen HLD (hyperlipidemia)   . BPH (benign prostatic hypertrophy)     s/p TURP  . CKD (chronic kidney disease), stage III   . Cancer [facility])     a. left kidney - s/p left nephrectomy.  . Arthritis     Osteo  . Kidney stone   . Essential hypertension    Past Surgical History  Procedure Laterality Date  . Appendectomy  1938  . Hernia repair  12/00  . Cataract extraction  1/01 and '04  . Transurethral resection of prostate  2004  . Left nephrectomy  1/05  . Left knee total arthroplasty  11/25/05  . Joint replacement      left hip; bilateral knee  . Cardiac catheterization  2006    2 sents in LAD  . Coronary angioplasty    . Lumbar laminectomy/decompression microdiscectomy  12/05/2011    Procedure: LUMBAR LAMINECTOMY/DECOMPRESSION MICRODISCECTOMY 1 LEVEL;  Surgeon: Elaina Hoops, MD;  Location: [place] [facility];  Service: Neurosurgery;  Laterality: Bilateral;  lumbar four - five  . Eye surgery      CATARACT  . Hip arthroplasty  2010    Left  . Knee arthroplasty  2015    Right  . Lumbar  laminectomy/decompression microdiscectomy  04/03/2012    Procedure: LUMBAR LAMINECTOMY/DECOMPRESSION MICRODISCECTOMY 1 LEVEL;  Surgeon: Elaina Hoops, MD;  Location: [place] [facility] ORS;  Service: Neurosurgery;  Laterality: Left;  left lumbar four-five laminectony diskectomy    No Known Allergies  Social History   Social History  . Marital Status: Widowed    Spouse Name: N/A  . Number of Children: N/A  . Years of Education: N/A   Occupational History  . Not on file.   Social History Main Topics  . Smoking status: Former Smoker -- 25 years    Quit date: 11/07/1982  . Smokeless tobacco: Not on file     Comment: 1984  . Alcohol Use: 1.8 oz/week    1 Glasses of wine, 2 Cans of beer per week     Comment: 1-2 beers   . Drug Use: No  . Sexual Activity: Not on file   Other Topics Concern  . Not on file   Social History Narrative   Widowed (wife died 5 years ago); 2 children - son, ; daughter,  - both alive   Retired - [employer]     Family History  Problem Relation Age of Onset  . Heart disease      GP  . Heart attack      GP     ROS- All systems are reviewed and negative except as per the HPI above  Physical Exam: Telemetry:  Sinus tachycardia Filed Vitals:   03/12/15 1628  BP: 112/74  Pulse: 114  Temp: 97.7 F (36.5 C)  TempSrc: Oral  Resp: 16  SpO2: 95%    GEN- The patient is well appearing, alert and oriented x 3 today.   Head- normocephalic, atraumatic Eyes-  Sclera clear, conjunctiva pink Ears- hearing intact Oropharynx- clear Neck- supple,JVP 8cm Lungs- Clear to ausculation bilaterally, normal work of breathing Heart- Regular rate and rhythm, no murmurs, rubs or gallops, PMI not laterally displaced GI- soft, NT, ND, + BS Extremities- no clubbing, cyanosis, +1 dependant edema (chronic per pt) MS- age appropriate atrophy, he has a foot drop Skin- no rash or lesion Psych- euthymic mood, full affect Neuro- strength and sensation are  intact  EKG: reveals sinus tachycardia, incomplete RBBB  Labs:   Lab Results  Component Value Date   WBC 6.9 03/26/2012   HGB 13.5 03/26/2012   HCT 41.3 03/26/2012   MCV 97.4 03/26/2012   PLT 220 03/26/2012   No results for input(s): NA, K, CL, CO2, BUN, CREATININE, CALCIUM, PROT, BILITOT, ALKPHOS, ALT, AST, GLUCOSE in the last 168 hours.  Invalid input(s): LABALBU No results found for: CKTOTAL, CKMB, CKMBINDEX, TROPONINI  Lab Results  Component Value Date   CHOL 151 11/05/2011   Lab Results  Component Value Date   HDL 49.30  11/05/2011   Lab Results  Component Value Date   LDLCALC 73 11/05/2011   Lab Results  Component Value Date   TRIG 142.0 11/05/2011   Lab Results  Component Value Date   CHOLHDL 3 11/05/2011   No results found for: LDLDIRECT    ASSESSMENT AND PLAN:   1. SOB/ [place]/ NSTEMI/ CAD I am worried that this represents his anginal equivalent.  He report identical symptoms in 2006 which resolved with PCI.  His symptoms are clearly exertional and resolve with rest/ lying down.  He does not feel that his weight has increased and lungs are relatively clear on exam.  I would therefore treat for NSTEMI. I would therefore recommend left heart catheterization with possible PCI.  Discussed the cath with the patient. The patient understands that risks included but are not limited to stroke (1 in 1000), death (1 in 19), kidney failure [usually temporary] (1 in 500), bleeding (1 in 200), allergic reaction [possibly serious] (1 in 200). The patient understands and agrees to proceed.  We will arrange cath in am.  He is aware that with single kidney his risks are increased.  Will gently hydrate in am. Continue ASA, beta blocker and statin.  Place on heparin drip overnight. Obtain echo to evaluate EF.  Hold diuresis for now pending cath results  2. CRI S/p L nephrectomy Risks of cath discussed with patient Will hold lasix and ace inhibitor until after cath Gentle  hydration in am  3.  HL Stable on crestor  4. HTN bp stable Holding ace inhibitor for cath given CRI   Thompson Grayer, MD 03/12/2015  4:47 PM

## 2015-03-13 ENCOUNTER — Inpatient Hospital Stay (HOSPITAL_COMMUNITY): Payer: PPO

## 2015-03-13 ENCOUNTER — Encounter (HOSPITAL_COMMUNITY)
Admission: AD | Disposition: A | Discharge status: Home / Self Care | Payer: Self-pay | Source: Other Acute Inpatient Hospital | Attending: Internal Medicine

## 2015-03-13 DIAGNOSIS — R0600 Dyspnea, unspecified: Secondary | ICD-10-CM

## 2015-03-13 DIAGNOSIS — R7989 Other specified abnormal findings of blood chemistry: Secondary | ICD-10-CM | POA: Diagnosis present

## 2015-03-13 LAB — BASIC METABOLIC PANEL
ANION GAP: 9 (ref 5–15)
Anion gap: 11 (ref 5–15)
BUN: 38 mg/dL — AB (ref 6–20)
BUN: 37 mg/dL — ABNORMAL HIGH (ref 6–20)
CHLORIDE: 106 mmol/L (ref 101–111)
CHLORIDE: 104 mmol/L (ref 101–111)
CO2: 22 mmol/L (ref 22–32)
CO2: 23 mmol/L (ref 22–32)
CREATININE: 2.04 mg/dL — AB (ref 0.61–1.24)
Calcium: 9 mg/dL (ref 8.9–10.3)
Calcium: 9.1 mg/dL (ref 8.9–10.3)
Creatinine, Ser: 2.08 mg/dL — ABNORMAL HIGH (ref 0.61–1.24)
GFR calc Af Amer: 33 mL/min — ABNORMAL LOW (ref >60)
GFR calc Af Amer: 33 mL/min — ABNORMAL LOW (ref >60)
GFR calc non Af Amer: 29 mL/min — ABNORMAL LOW (ref >60)
GFR, EST NON AFRICAN AMERICAN: 28 mL/min — AB (ref >60)
GLUCOSE: 110 mg/dL — AB (ref 65–99)
Glucose, Bld: 158 mg/dL — ABNORMAL HIGH (ref 65–99)
POTASSIUM: 4.8 mmol/L (ref 3.5–5.1)
Potassium: 4.5 mmol/L (ref 3.5–5.1)
SODIUM: 136 mmol/L (ref 135–145)
Sodium: 139 mmol/L (ref 135–145)

## 2015-03-13 LAB — CBC
HEMATOCRIT: 35.3 % — AB (ref 39.0–52.0)
HEMOGLOBIN: 11.6 g/dL — AB (ref 13.0–17.0)
MCH: 32.6 pg (ref 26.0–34.0)
MCHC: 32.9 g/dL (ref 30.0–36.0)
MCV: 99.2 fL (ref 78.0–100.0)
Platelets: 181 10*3/uL (ref 150–400)
RBC: 3.56 MIL/uL — ABNORMAL LOW (ref 4.22–5.81)
RDW: 16.4 % — ABNORMAL HIGH (ref 11.5–15.5)
WBC: 9.5 10*3/uL (ref 4.0–10.5)

## 2015-03-13 LAB — LIPID PANEL
Cholesterol: 132 mg/dL (ref 0–200)
HDL: 36 mg/dL — ABNORMAL LOW (ref >40)
LDL CALC: 75 mg/dL (ref 0–99)
TRIGLYCERIDES: 104 mg/dL (ref <150)
Total CHOL/HDL Ratio: 3.7 RATIO
VLDL: 21 mg/dL (ref 0–40)

## 2015-03-13 LAB — HEPARIN LEVEL (UNFRACTIONATED)
HEPARIN UNFRACTIONATED: 0.34 [IU]/mL (ref 0.30–0.70)
Heparin Unfractionated: 0.36 IU/mL (ref 0.30–0.70)

## 2015-03-13 LAB — TROPONIN I
Troponin I: 0.25 ng/mL — ABNORMAL HIGH (ref <0.031)
Troponin I: 0.27 ng/mL — ABNORMAL HIGH (ref <0.031)

## 2015-03-13 SURGERY — LEFT HEART CATH AND CORONARY ANGIOGRAPHY
Anesthesia: LOCAL

## 2015-03-13 MED ORDER — FUROSEMIDE 10 MG/ML IJ SOLN
80.0000 mg | Freq: Once | INTRAMUSCULAR | Status: AC
  Administered 2015-03-13: 80 mg via INTRAVENOUS
  Filled 2015-03-13: qty 8

## 2015-03-13 MED ORDER — SODIUM CHLORIDE 0.9 % IV SOLN: 250.0000 mL | INTRAVENOUS | Status: DC | PRN

## 2015-03-13 NOTE — Progress Notes (Signed)
UR Completed Annalaya Wile Graves-Bigelow, RN,BSN [phone]  

## 2015-03-13 NOTE — Progress Notes (Signed)
Patient Name: Ricardo Soto Date of Encounter: 03/13/2015  Principal Problem:   NSTEMI (non-ST elevated myocardial infarction) [facility]) Active Problems:   HYPERCHOLESTEROLEMIA-PURE   Essential hypertension   CKD (chronic kidney disease), stage III   Elevated brain natriuretic peptide (BNP) level   Primary Cardiologist: Dr Stanford Breed  Patient Profile: 81 yo male w/ hx BMS x 2 LAD 2006, HL, BPH, L nephrectomy for renal CA, CKD, HTN, nl EF by MV 2010, chronic LE edema. Admitted from [facility] w/ chest pain, elevated troponin, elevated creatinine.  SUBJECTIVE: DOE was only symptom. OK at rest, has not been OOB much since admit. Aware of elevated BUN/Cr but does not know numbers. Drinks a lot of water at home to keep kidneys healthier.  OBJECTIVE Filed Vitals:   03/12/15 1628 03/12/15 1700 03/12/15 2035 03/13/15 0423  BP: 112/74  103/64 110/64  Pulse: 114  118 110  Temp: 97.7 F (36.5 C)  97.7 F (36.5 C) 99 F (37.2 C)  TempSrc: Oral  Oral Oral  Resp: 16     Height:  5\' 11"  (1.803 m)    Weight:  180 lb 1.6 oz (81.693 kg)  180 lb 6.4 oz (81.829 kg)  SpO2: 95%  96% 96%    Intake/Output Summary (Last 24 hours) at 03/13/15 0841 Last data filed at 03/13/15 0423  Gross per 24 hour  Intake 242.22 ml  Output   1200 ml  Net -957.78 ml   Filed Weights   03/12/15 1700 03/13/15 0423  Weight: 180 lb 1.6 oz (81.693 kg) 180 lb 6.4 oz (81.829 kg)    PHYSICAL EXAM General: Well developed, well nourished, male in no acute distress. Head: Normocephalic, atraumatic.  Neck: Supple without bruits, JVD elevated 10 cm. Lungs:  Resp regular and unlabored, dry rales but good air exchange. Heart: RRR, S1, S2, no S3, S4, or murmur; no rub. Abdomen: Soft, non-tender, non-distended, BS + x 4.  Extremities: No clubbing, cyanosis, trace edema.  Neuro: Alert and oriented X 3. Moves all extremities spontaneously. Psych: Normal affect.  LABS: At [facility]: WBC 9.2, Hgb 12.5, Hct 37.1, Plt  191, Na 140, K 4.7, Cl 106, CO2 21, BUN 39, Cr 1.9, glucose 121. PH 7.480, PCo2 26, PO2 62, Bicarb 19. LFTs WNL. Troponin 0.37 (ref range <0.04),  pBNP 24200 (normal 0-1800), albumin 3.7, INR 1.1.   CBC:  Recent Labs  03/13/15 0517  WBC 9.5  HGB 11.6*  HCT 35.3*  MCV 99.2  PLT 181   INR:  Recent Labs  03/12/15 1806  INR 123456   Basic Metabolic Panel:  Recent Labs  03/13/15 0517  NA 139  K 4.5  CL 106  CO2 22  GLUCOSE 110*  BUN 38*  CREATININE 2.04*  CALCIUM 9.0   Cardiac Enzymes:  Recent Labs  03/12/15 1806 03/12/15 2307 03/13/15 0517  TROPONINI 0.31* 0.25* 0.27*   Fasting Lipid Panel:  Recent Labs  03/13/15 0517  CHOL 132  HDL 36*  LDLCALC 75  TRIG 104  CHOLHDL 3.7   Thyroid Function Tests:  Recent Labs  03/12/15 1806  TSH 4.905*   TELE:  SR, ST      Radiology/Studies: 12/04 at [facility] "coarsened perihilar and bibasilar interstitial markings, with perhaps mild central pulm vascular congestion suggesting edema related to either bronchitic change or mild volume overload. No PNA. No Pleural effusion. Hyperexpanded lungs suggestive of COPD."    Current Medications:  . aspirin  81 mg Oral Daily  . folic acid  1 mg Oral Daily  . metoprolol succinate  25 mg Oral Daily  . multivitamin with minerals  1 tablet Oral Daily  . omega-3 acid ethyl esters  1 g Oral Daily  . rosuvastatin  10 mg Oral Daily  . sodium chloride  3 mL Intravenous Q12H   . sodium chloride 50 mL/hr at 03/13/15 0629  . heparin 950 Units/hr (03/12/15 1831)    ASSESSMENT AND PLAN: Principal Problem:   NSTEMI (non-ST elevated myocardial infarction) ([place]) - on ASA, low-dose BB, statin - cath planned, but Cr elevated, defer for now.  - ck echo      Active Problems:   HYPERCHOLESTEROLEMIA-PURE - good control on current dose of Crestor    Essential hypertension - SBP < 115 since admission - was on ACE pta, held    CKD (chronic kidney disease), stage III - Cr 1.13 April 2014 - 1.9 at [place], 2.04 here.... At this point this morning it is not clear what the basis of the slight rise in creatinine is. ACE inhibitor has been put on hold. He did have 600 cc of urine in his bladder in the emergency room yesterday and a Foley was placed. He may have a component of obstruction. I've chosen to keep the Foley catheter in today.    Elevated brain natriuretic peptide (BNP) level - elevated at [facility] Chest x-ray did raise the question of some increased volume. However the patient in addition does have significant lung disease. - CHF may be cause of elevated troponin - elevated JVD, will recheck CXR, -    Sinus tachycardia - on home dose BB - concern for this coming from CHF -TSH this admission is normal.  Possible urinary retention    Foley catheter was placed in the emergency room yesterday. The patient did have 600 cc of urine in his bladder at that time. I've chosen to leave his catheter in today.  Augusto Garbe 8:41 AM 03/13/2015  Patient seen and examined. I agree with the assessment and plan as detailed above. See also my additional thoughts below.   I agree with the node is outlined above. The assessment and plan does include additions from me. I have assessed the patient carefully while trying to decide what his volume status is. His exertional shortness of breath is similar to his symptom of ischemia in the past. He is not having PND or orthopnea. He has chronic swelling in his left leg. There is no swelling in the right leg today. He did receive one small dose of Lasix yesterday. Based on his symptoms, I am not sure that he is volume overloaded. His urine is concentrated this morning with his Foley in place. This of course could be related to either decrease volume or CHF. His lungs were described as clear yesterday. Today on my exam he has definite wet sounding rhonchi with possible rales. His BNP was elevated in the emergency room  at [facility]. After careful consideration, I have decided to proceed with a repeat chest x-ray and to give him 80 mg of IV Lasix. We will watch his output carefully. We will proceed with follow-up chemistry labs this afternoon. I will review the case with Dr. Burt Knack and ask him to reassess the patient later in the day. We will not proceed with cardiac catheterization today.  Dola Argyle, MD, [facility] 03/13/2015 10:27 AM

## 2015-03-13 NOTE — Progress Notes (Signed)
ANTICOAGULATION CONSULT NOTE - Follow Up Consult  Pharmacy Consult for heparin Indication: chest pain/ACS  Labs:  Recent Labs  03/12/15 1806 03/12/15 2307  APTT 35  --   LABPROT 15.0  --   INR 1.17  --   HEPARINUNFRC  --  0.36  TROPONINI 0.31* 0.25*    Assessment/Plan:  81yo male therapeutic on heparin with initial dosing for ACS though lab was drawn early so difficult to assess whether this represents bolus vs steady state. Will continue gtt at current rate for now and confirm stable with am labs.   Wynona Neat, PharmD, BCPS  03/13/2015,12:19 AM

## 2015-03-13 NOTE — Progress Notes (Signed)
ANTICOAGULATION CONSULT NOTE - Initial Consult  Pharmacy Consult for heparin Indication: chest pain/ACS  No Known Allergies  Patient Measurements: Height: 5\' 11"  (180.3 cm) Weight: 180 lb 6.4 oz (81.829 kg) IBW/kg (Calculated) : 75.3 Heparin Dosing Weight: 82 kg  Vital Signs: Temp: 99 F (37.2 C) (12/05 0423) Temp Source: Oral (12/05 0423) BP: 110/64 mmHg (12/05 0423) Pulse Rate: 110 (12/05 0423)  Labs:  Recent Labs  03/12/15 1806 03/12/15 2307 03/13/15 0417 03/13/15 0517  HGB  --   --   --  11.6*  HCT  --   --   --  35.3*  PLT  --   --   --  181  APTT 35  --   --   --   LABPROT 15.0  --   --   --   INR 1.17  --   --   --   HEPARINUNFRC  --  0.36 0.34  --   CREATININE  --   --   --  2.04*  TROPONINI 0.31* 0.25*  --  0.27*    Estimated Creatinine Clearance: 30.2 mL/min (by C-G formula based on Cr of 2.04).  Assessment: 81 yo male presented to [facility] ED with SOB. Trop elevated. Pharmacy consulted to dose heparin for ACS. No anticoag pta.   HL therapeutic on 950 units/h. Cath scheduled - cancelled for today due to INR. Hg 11.6, ptl wnl. No bleed documented  Goal of Therapy:  Heparin level 0.3-0.7 units/ml Monitor platelets by anticoagulation protocol: Yes   Plan:  Heparin at 950 units/hr  Daily HL, CBC  Monitor s/sx of bleeding  F/U after cath  Elicia Lamp, PharmD, BCPS Clinical Pharmacist Pager [phone] 03/13/2015 10:10 AM

## 2015-03-13 NOTE — Care Management Note (Addendum)
Case Management Note  Patient Details  Name: Ricardo Soto MRN: [id number] Date of Birth: 01/06/1932  Subjective/Objective:     Pt admitted as a transfer from [facility] and CHF- Plan for Cardiac Cath.   Action/Plan: CM will continue to monitor for disposition needs.    Expected Discharge Date:                  Expected Discharge Plan:  [place]  In-House Referral:  NA  Discharge planning Services  CM Consult  Post Acute Care Choice:   Home Health Choice offered to:   Patient  DME Arranged:    DME Agency:     HH Arranged:   Registered Nurse and Physical Therapy HH Agency:   [facility]  Status of Service: Completed Medicare Important Message Given:    Date Medicare IM Given:    Medicare IM give by:    Date Additional Medicare IM Given:    Additional Medicare Important Message give by:     If discussed at [place] of Stay Meetings, dates discussed:    Additional Comments:  1106 03-16-15 Jacqlyn Krauss, RN,BSN [phone] CM did speak with pt in regards to disposition needs. Pt is from home alone and will benefit from RN for disease management and PT Services. CM did offer choice and pt chose [facility]. CM did make referral and SOC to begin within 24-48 hrs post d/c. No further needs from CM at this time.    1414 03-15-15 Jacqlyn Krauss, RN,BSN [phone] CM did benefits check for Eliquis and co pay: Pt copay will be $40-prior auth not required. CM will provide 30 day free card. Pt will need Rx for 30 day free no refills and the original Rx with refills. No further needs from CM at this time.  CM did call [facility] to make sure medication is available and it is available. No further needs.   Bethena Roys, RN 03/13/2015, 10:56 AM

## 2015-03-13 NOTE — Progress Notes (Signed)
  Echocardiogram 2D Echocardiogram has been performed.  Jennette Dubin 03/13/2015, 2:34 PM

## 2015-03-13 NOTE — Progress Notes (Signed)
PM Round: Pt discussed this am with Dr Ron Parker. He has diuresed well with no change in creatinine. I have personally reviewed his echo images - official interpretation is pending. His RV is markedly dilated and hypokinetic. High suspicion for pulmonary embolus based on tachycardia and echo findings. He has been on IV heparin and remains hemodynamically stable without resting symptoms. He is NOT requiring O2. With solitary kidney and elevated creatinine, he is not a candidate for a CTA study. Will order LE dopplers and a VQ scan to confirm diagnosis. Continue IV heparin and anticipate transition to an oral anticoagulant drug in next 24-48 hours. Discussed with CCM - without hemodynamic compromise or O2 requirement, there is no indication for thrombolysis or catheter-directed therapies. Plan discussed at length with patient and his family members at the bedside.   Sherren Mocha 03/13/2015 5:57 PM

## 2015-03-14 ENCOUNTER — Inpatient Hospital Stay (HOSPITAL_COMMUNITY): Payer: PPO

## 2015-03-14 ENCOUNTER — Encounter (HOSPITAL_COMMUNITY): Payer: Self-pay | Admitting: *Deleted

## 2015-03-14 DIAGNOSIS — I2609 Other pulmonary embolism with acute cor pulmonale: Secondary | ICD-10-CM

## 2015-03-14 DIAGNOSIS — I2699 Other pulmonary embolism without acute cor pulmonale: Secondary | ICD-10-CM

## 2015-03-14 DIAGNOSIS — I82409 Acute embolism and thrombosis of unspecified deep veins of unspecified lower extremity: Secondary | ICD-10-CM

## 2015-03-14 DIAGNOSIS — M7989 Other specified soft tissue disorders: Secondary | ICD-10-CM

## 2015-03-14 HISTORY — DX: Other pulmonary embolism without acute cor pulmonale: I26.99

## 2015-03-14 HISTORY — DX: Acute embolism and thrombosis of unspecified deep veins of unspecified lower extremity: I82.409

## 2015-03-14 LAB — CBC
HCT: 35.8 % — ABNORMAL LOW (ref 39.0–52.0)
Hemoglobin: 11.4 g/dL — ABNORMAL LOW (ref 13.0–17.0)
MCH: 31.8 pg (ref 26.0–34.0)
MCHC: 31.8 g/dL (ref 30.0–36.0)
MCV: 99.7 fL (ref 78.0–100.0)
PLATELETS: 182 10*3/uL (ref 150–400)
RBC: 3.59 MIL/uL — ABNORMAL LOW (ref 4.22–5.81)
RDW: 16.5 % — AB (ref 11.5–15.5)
WBC: 8.1 10*3/uL (ref 4.0–10.5)

## 2015-03-14 LAB — BASIC METABOLIC PANEL
Anion gap: 8 (ref 5–15)
BUN: 35 mg/dL — ABNORMAL HIGH (ref 6–20)
CALCIUM: 8.7 mg/dL — AB (ref 8.9–10.3)
CO2: 24 mmol/L (ref 22–32)
CREATININE: 2.01 mg/dL — AB (ref 0.61–1.24)
Chloride: 104 mmol/L (ref 101–111)
GFR calc Af Amer: 34 mL/min — ABNORMAL LOW (ref >60)
GFR, EST NON AFRICAN AMERICAN: 29 mL/min — AB (ref >60)
GLUCOSE: 107 mg/dL — AB (ref 65–99)
Potassium: 4.1 mmol/L (ref 3.5–5.1)
Sodium: 136 mmol/L (ref 135–145)

## 2015-03-14 LAB — BRAIN NATRIURETIC PEPTIDE: B NATRIURETIC PEPTIDE 5: 880.1 pg/mL — AB (ref 0.0–100.0)

## 2015-03-14 LAB — HEPARIN LEVEL (UNFRACTIONATED)
Heparin Unfractionated: 0.25 IU/mL — ABNORMAL LOW (ref 0.30–0.70)
Heparin Unfractionated: 0.42 IU/mL (ref 0.30–0.70)

## 2015-03-14 LAB — TSH: TSH: 5.107 u[IU]/mL — AB (ref 0.350–4.500)

## 2015-03-14 MED ORDER — TECHNETIUM TO 99M ALBUMIN AGGREGATED
4.3000 | Freq: Once | INTRAVENOUS | Status: AC | PRN
  Administered 2015-03-14: 4 via INTRAVENOUS

## 2015-03-14 MED ORDER — APIXABAN 5 MG PO TABS: 5.0000 mg | ORAL_TABLET | Freq: Two times a day (BID) | ORAL | Status: DC

## 2015-03-14 MED ORDER — TECHNETIUM TC 99M DIETHYLENETRIAME-PENTAACETIC ACID: 31.2000 | Freq: Once | INTRAVENOUS | Status: DC | PRN

## 2015-03-14 MED ORDER — APIXABAN 5 MG PO TABS
10.0000 mg | ORAL_TABLET | Freq: Two times a day (BID) | ORAL | Status: DC
  Administered 2015-03-14 – 2015-03-17 (×7): 10 mg via ORAL
  Filled 2015-03-14 (×8): qty 2

## 2015-03-14 NOTE — Progress Notes (Signed)
Patient Name: Ricardo Soto Date of Encounter: 03/14/2015  Primary Cardiologist: Dr Stanford Breed   Principal Problem:   NSTEMI (non-ST elevated myocardial infarction) [facility] [facility]) Active Problems:   HYPERCHOLESTEROLEMIA-PURE   Essential hypertension   CKD (chronic kidney disease), stage III   Elevated brain natriuretic peptide (BNP) level    SUBJECTIVE  Denies any CP, mild SOB. Has not been feeling good for 2 month, was previously active, but not in the last 2 month.   CURRENT MEDS . aspirin  81 mg Oral Daily  . folic acid  1 mg Oral Daily  . metoprolol succinate  25 mg Oral Daily  . multivitamin with minerals  1 tablet Oral Daily  . omega-3 acid ethyl esters  1 g Oral Daily  . rosuvastatin  10 mg Oral Daily  . sodium chloride  3 mL Intravenous Q12H    OBJECTIVE  Filed Vitals:   03/13/15 0423 03/13/15 1400 03/13/15 2003 03/14/15 0510  BP: 110/64 108/69 100/63 111/76  Pulse: 110 112 110 99  Temp: 99 F (37.2 C) 97.7 F (36.5 C) 99 F (37.2 C) 98.6 F (37 C)  TempSrc: Oral Oral Oral Oral  Resp:  18 18 18   Height:      Weight: 180 lb 6.4 oz (81.829 kg)   178 lb 3.2 oz (80.831 kg)  SpO2: 96% 97% 97% 94%    Intake/Output Summary (Last 24 hours) at 03/14/15 1051 Last data filed at 03/14/15 0511  Gross per 24 hour  Intake 1176.71 ml  Output   3250 ml  Net -2073.29 ml   Filed Weights   03/12/15 1700 03/13/15 0423 03/14/15 0510  Weight: 180 lb 1.6 oz (81.693 kg) 180 lb 6.4 oz (81.829 kg) 178 lb 3.2 oz (80.831 kg)    PHYSICAL EXAM  General: Pleasant, NAD. Neuro: Alert and oriented X 3. Moves all extremities spontaneously. Psych: Normal affect. HEENT:  Normal  Neck: Supple without bruits or JVD. Lungs:  Resp regular and unlabored, CTA. Heart: RRR no s3, s4, or murmurs. Abdomen: Soft, non-tender, non-distended, BS + x 4.  Extremities: No clubbing, cyanosis DP/PT/Radials 2+ and equal bilaterally. 1+ pitting edema in the LLE  Accessory Clinical  Findings  CBC  Recent Labs  03/13/15 0517 03/14/15 0417  WBC 9.5 8.1  HGB 11.6* 11.4*  HCT 35.3* 35.8*  MCV 99.2 99.7  PLT 181 [id number]   Basic Metabolic Panel  Recent Labs  03/13/15 1430 03/14/15 0417  NA 136 136  K 4.8 4.1  CL 104 104  CO2 23 24  GLUCOSE 158* 107*  BUN 37* 35*  CREATININE 2.08* 2.01*  CALCIUM 9.1 8.7*   Cardiac Enzymes  Recent Labs  03/12/15 1806 03/12/15 2307 03/13/15 0517  TROPONINI 0.31* 0.25* 0.27*   Fasting Lipid Panel  Recent Labs  03/13/15 0517  CHOL 132  HDL 36*  LDLCALC 75  TRIG 104  CHOLHDL 3.7   Thyroid Function Tests  Recent Labs  03/14/15 0417  TSH 5.107*    TELE Sinus tach without significant ventricular ectopy    ECG  No new EKG  Echocardiogram 03/13/2015  LV EF: 45% -  50%  ------------------------------------------------------------------- Indications:   Dyspnea 786.09.  ------------------------------------------------------------------- History:  PMH:  Coronary artery disease. Risk factors: Hypertension. Dyslipidemia.  ------------------------------------------------------------------- Study Conclusions  - Left ventricle: The cavity size was below normal. Wall thickness was normal. Systolic function was mildly reduced. The estimated ejection fraction was in the range of 45% to 50%. Diffuse hypokinesis. Doppler parameters are consistent  with abnormal left ventricular relaxation (grade 1 diastolic dysfunction). - Ventricular septum: D-shaped interventricular septum suggestive of RV pressure/volume overload. - Aortic valve: There was no stenosis. - Mitral valve: There was no significant regurgitation. - Right ventricle: The cavity size was severely dilated. Systolic function was moderately reduced. - Right atrium: The atrium was moderately dilated. - Atrial septum: The septum bowed from right to left, consistent with increased right atrial pressure. - Tricuspid valve: Peak  RV-RA gradient (S): 40 mm Hg. - Pulmonary arteries: PA peak pressure: 55 mm Hg (S). - Systemic veins: IVC measured 2.5 cm with < 50% respirophasic variation, suggesting RA pressure 15 mmHg.  Impressions:  - The LV was small, compressed by the RV. EF estimate 45-50% with diffuse hypokinesis. D-shaped interventricular septum suggesting RV pressure/volume overload. Severely dilated RV with moderate systolic dysfunction. Moderate pulmonary hypertension. Would consider PE.    Radiology/Studies  Dg Chest 2 View  03/13/2015  CLINICAL DATA:  Shortness of Breath, upper respiratory infection EXAM: CHEST  2 VIEW COMPARISON:  03/12/2015 and 12/02/2011 FINDINGS: Cardiomediastinal silhouette is stable. Reticular mild interstitial prominence bilaterally again noted in suspicious for mild pneumonitis or less likely edema. No segmental infiltrate. Mild degenerative changes thoracic spine. IMPRESSION: Reticular mild interstitial prominence bilateral suspicious for mild pneumonitis or less likely pulmonary edema. Mild degenerative changes thoracic spine. No segmental infiltrate Electronically Signed   By: Lahoma Crocker M.D.   On: 03/13/2015 11:25   Nm Pulmonary Perf And Vent  03/14/2015  CLINICAL DATA:  Shortness of breath, chest pain since Friday night. EXAM: NUCLEAR MEDICINE VENTILATION - PERFUSION LUNG SCAN TECHNIQUE: Ventilation images were obtained in multiple projections using inhaled aerosol Tc-71m DTPA. Perfusion images were obtained in multiple projections after intravenous injection of Tc-34m MAA. RADIOPHARMACEUTICALS:  [id number] millicuries of [id number] DTPA aerosol inhalation and 4.3 millicuries [id number] MAA IV COMPARISON:  Chest x-ray 03/13/2015 FINDINGS: Ventilation: Patchy nonsegmental ventilation defects Perfusion: Wedge-shaped perfusion defects noted within the lingula and more superiorly anteriorly in the left upper lobe, moderate in size. No perfusion defects on the right.  IMPRESSION: Two moderate-sized wedge-shaped perfusion defects within the left upper lobe. Study is intermediate probability for pulmonary embolus. These results will be called to the ordering clinician or representative by the Radiologist Assistant, and communication documented in the PACS or zVision Dashboard. Electronically Signed   By: Rolm Baptise M.D.   On: 03/14/2015 09:44    ASSESSMENT AND PLAN  Mr. Varnes is an 81 y/o M with history of CAD s/p overlapping BMS-LAD 03/2005, HLD, BPH, renal CA s/p left nephrectomy, CKD, HTN, remote tobacco abuse who presented to [facility] ER today with SOB  1. NSTEMI  - cath held off due to renal problem.   - cath 03/13/2015 EF Q000111Q, grade 1 diastolic dysfunction, D shaped interventricular septum suggestive of RV pressure overload, PA peak pressure 69mmHg.   - likely related to DVT/PE, may consider outpatient myoview later if still symptomatic.  2. DVT and likely PE  - LE venous doppler positive for DVT involving involving the right gastrocnemius, left femoral, left popliteal, left posterior tibial, and left peroneal veins  - V/Q scan showed 2 moderate sized wedge shaped perfusion defect within the LUL, intermediate probability for PE  - discussed NOAC vs coumadin with patient, he denies any prior bleeding issue or need for transfusion.   - continue IV heparin, bridge with PO coumadin given NOAC less ideal as his Cr 2.   - given his h/o CAD, will discuss with MD regarding  whether to continue on ASA  2. CRI s/p L nephrectomy  3. HLD  4. HTN  5. Chronic LLE pitting edema   Signed, Almyra Deforest PA-C Pager: [phone]  Patient seen, examined. Available data reviewed. Agree with findings, assessment, and plan as outlined by Almyra Deforest, PA-C. Pt's exam unchanged with resting tachycardia, mildly elevated JVP, clear lung fields, and 1+ left leg swelling. All data reviewed: echo, VQ scan, and LE dopplers. Pt with bilateral DVT and intermediate risk VQ.  Clinical scenario essentially diagnostic of acute PE with evidence of right heart strain on echo. Reviewed further options with pharmacy and the patient (warfarin versus DOAC). With Creatinine Clearance > 25, favor Apixaban 10 mg BID x 7 days then 5 mg BID. Will ask pharmacy to transition from heparin to apixaban. Will consult PT to ambulate patient. Would like to see O2 sat with ambulation. Also will consult social worker as patient will need transportation home to [facility] - anticipate DC 48 hrs.  Sherren Mocha, M.D. 03/14/2015 1:24 PM

## 2015-03-14 NOTE — Progress Notes (Signed)
PT Cancellation Note  Patient Details Name: Ricardo Soto MRN: [id number] DOB: 11/05/1931   Cancelled Treatment:    Reason Eval/Treat Not Completed: Medical issues which prohibited therapy.  Pt w/ multiple confirmed LE DVTs and is subtherapeutic on heparin.  Additionally, suspect PE.  Will continue to follow and will complete evaluation once pt medically appropriate.  Thank you for this order.  Joslyn Hy PT, DPT [phone] Pager: [phone] 03/14/2015, 2:19 PM

## 2015-03-14 NOTE — Progress Notes (Signed)
ANTICOAGULATION CONSULT NOTE - Follow Up Consult  Pharmacy Consult for heparin Indication: CP w/ possible PE  Labs:  Recent Labs  03/12/15 1806 03/12/15 2307 03/13/15 0417 03/13/15 0517 03/13/15 1430 03/14/15 0417  HGB  --   --   --  11.6*  --  11.4*  HCT  --   --   --  35.3*  --  35.8*  PLT  --   --   --  181  --  182  APTT 35  --   --   --   --   --   LABPROT 15.0  --   --   --   --   --   INR 1.17  --   --   --   --   --   HEPARINUNFRC  --  0.36 0.34  --   --  0.25*  CREATININE  --   --   --  2.04* 2.08*  --   TROPONINI 0.31* 0.25*  --  0.27*  --   --     Assessment: 81yo male now subtherapeutic on heparin after two levels at lower end of goal; noted that there is now suspicion for PE, awaiting VQ scan.  Goal of Therapy:  Heparin level 0.3-0.7 units/ml   Plan:  Will increase heparin gtt by 2 units/kg/hr to 1100 units/hr and check level in [place], PharmD, BCPS  03/14/2015,5:20 AM

## 2015-03-14 NOTE — Progress Notes (Signed)
ANTICOAGULATION CONSULT NOTE - Initial Consult  Pharmacy Consult for apixaban Indication: acute PE/DVT  No Known Allergies  Patient Measurements: Height: 5\' 11"  (180.3 cm) Weight: 178 lb 3.2 oz (80.831 kg) IBW/kg (Calculated) : 75.3 Heparin Dosing Weight: 82 kg  Vital Signs: Temp: 98.6 F (37 C) (12/06 0510) Temp Source: Oral (12/06 0510) BP: 111/76 mmHg (12/06 0510) Pulse Rate: 99 (12/06 0510)  Labs:  Recent Labs  03/12/15 1806 03/12/15 2307 03/13/15 0417 03/13/15 0517 03/13/15 1430 03/14/15 0417  HGB  --   --   --  11.6*  --  11.4*  HCT  --   --   --  35.3*  --  35.8*  PLT  --   --   --  181  --  182  APTT 35  --   --   --   --   --   LABPROT 15.0  --   --   --   --   --   INR 1.17  --   --   --   --   --   HEPARINUNFRC  --  0.36 0.34  --   --  0.25*  CREATININE  --   --   --  2.04* 2.08* 2.01*  TROPONINI 0.31* 0.25*  --  0.27*  --   --     Estimated Creatinine Clearance: 30.7 mL/min (by C-G formula based on Cr of 2.01).  Assessment: 81 yo male presented initially to [facility] ED with SOB. Trop elevated. Pharmacy consulted to dose heparin for ACS. No anticoag pta. Now with acute PE/DVT. Heparin to transition to apixaban today. SCr 2.01 today, CrCl~30. Hg 11.4 stable, ptl wnl. No bleed documented. Communicated with RN to d/c heparin gtt at time 1st dose of apixaban given.  Goal of Therapy:  Heparin level 0.3-0.7 units/ml Monitor platelets by anticoagulation protocol: Yes   Plan:  D/c heparin at time 1st apixaban dose is given Apixaban 10mg  bid x 7 days; then 5mg  bid Monitor CBC, s/sx of bleeding  Elicia Lamp, PharmD, BCPS Clinical Pharmacist Pager [phone] 03/14/2015 2:18 PM

## 2015-03-14 NOTE — Discharge Instructions (Signed)
Information on my medicine - ELIQUIS (apixaban)  This medication education was reviewed with me or my healthcare representative as part of my discharge preparation.  The pharmacist that spoke with me during my hospital stay was:  Romona Curls, [facility]  Why was Eliquis prescribed for you? Eliquis was prescribed to treat blood clots that may have been found in the veins of your legs (deep vein thrombosis) or in your lungs (pulmonary embolism) and to reduce the risk of them occurring again.  What do You need to know about Eliquis ? The starting dose is 10 mg (two 5 mg tablets) taken TWICE daily for the FIRST SEVEN (7) DAYS, then on (enter date)  03/21/15  the dose is reduced to ONE 5 mg tablet taken TWICE daily.  Eliquis may be taken with or without food.   Try to take the dose about the same time in the morning and in the evening. If you have difficulty swallowing the tablet whole please discuss with your pharmacist how to take the medication safely.  Take Eliquis exactly as prescribed and DO NOT stop taking Eliquis without talking to the doctor who prescribed the medication.  Stopping may increase your risk of developing a new blood clot.  Refill your prescription before you run out.  After discharge, you should have regular check-up appointments with your healthcare provider that is prescribing your Eliquis.    What do you do if you miss a dose? If a dose of ELIQUIS is not taken at the scheduled time, take it as soon as possible on the same day and twice-daily administration should be resumed. The dose should not be doubled to make up for a missed dose.  Important Safety Information A possible side effect of Eliquis is bleeding. You should call your healthcare provider right away if you experience any of the following: ? Bleeding from an injury or your nose that does not stop. ? Unusual colored urine (red or dark brown) or unusual colored stools (red or black). ? Unusual bruising for  unknown reasons. ? A serious fall or if you hit your head (even if there is no bleeding).  Some medicines may interact with Eliquis and might increase your risk of bleeding or clotting while on Eliquis. To help avoid this, consult your healthcare provider or pharmacist prior to using any new prescription or non-prescription medications, including herbals, vitamins, non-steroidal anti-inflammatory drugs (NSAIDs) and supplements.  This website has more information on Eliquis (apixaban): http://www.eliquis.com/eliquis/home

## 2015-03-14 NOTE — Progress Notes (Signed)
*  Preliminary Results* Bilateral lower extremity venous duplex completed. Bilateral lower extremities are positive for deep vein thrombosis involving the right gastrocnemius, left femoral, left popliteal, left posterior tibial, and left peroneal veins. There is no evidence of Baker's cyst bilaterally.  Preliminary results discussed with Merrily Pew, RN.  03/14/2015  Maudry Mayhew, RVT, RDCS, RDMS

## 2015-03-15 ENCOUNTER — Encounter (HOSPITAL_COMMUNITY): Payer: Self-pay | Admitting: Cardiovascular Disease

## 2015-03-15 DIAGNOSIS — I2699 Other pulmonary embolism without acute cor pulmonale: Secondary | ICD-10-CM

## 2015-03-15 HISTORY — DX: Other pulmonary embolism without acute cor pulmonale: I26.99

## 2015-03-15 LAB — BASIC METABOLIC PANEL
ANION GAP: 9 (ref 5–15)
BUN: 26 mg/dL — AB (ref 6–20)
CHLORIDE: 104 mmol/L (ref 101–111)
CO2: 24 mmol/L (ref 22–32)
Calcium: 8.8 mg/dL — ABNORMAL LOW (ref 8.9–10.3)
Creatinine, Ser: 1.76 mg/dL — ABNORMAL HIGH (ref 0.61–1.24)
GFR calc Af Amer: 40 mL/min — ABNORMAL LOW (ref >60)
GFR, EST NON AFRICAN AMERICAN: 35 mL/min — AB (ref >60)
Glucose, Bld: 104 mg/dL — ABNORMAL HIGH (ref 65–99)
POTASSIUM: 4.7 mmol/L (ref 3.5–5.1)
SODIUM: 137 mmol/L (ref 135–145)

## 2015-03-15 LAB — CBC
HEMATOCRIT: 36.3 % — AB (ref 39.0–52.0)
HEMOGLOBIN: 11.5 g/dL — AB (ref 13.0–17.0)
MCH: 31.7 pg (ref 26.0–34.0)
MCHC: 31.7 g/dL (ref 30.0–36.0)
MCV: 100 fL (ref 78.0–100.0)
Platelets: 197 10*3/uL (ref 150–400)
RBC: 3.63 MIL/uL — AB (ref 4.22–5.81)
RDW: 16.4 % — ABNORMAL HIGH (ref 11.5–15.5)
WBC: 8.1 10*3/uL (ref 4.0–10.5)

## 2015-03-15 LAB — HEPARIN LEVEL (UNFRACTIONATED)

## 2015-03-15 NOTE — Care Management Important Message (Signed)
Important Message  Patient Details  Name: CONNELLY TRISTAN MRN: [id number] Date of Birth: 1931/07/30   Medicare Important Message Given:  Yes    Aubryanna Nesheim Abena 03/15/2015, 11:39 AM

## 2015-03-15 NOTE — Progress Notes (Signed)
    Subjective:  Patient doing well this morning without chest pain or dyspnea at rest. He has been up to the bathroom without symptoms.  Objective:  Vital Signs in the last 24 hours: Temp:  [97.9 F (36.6 C)-98.4 F (36.9 C)] 98.4 F (36.9 C) (12/07 0459) Pulse Rate:  [97-111] 97 (12/07 0459) Resp:  [16-18] 18 (12/07 0459) BP: (101-105)/(66-69) 101/66 mmHg (12/07 0459) SpO2:  [94 %-95 %] 95 % (12/07 0459) Weight:  [178 lb 3.2 oz (80.831 kg)] 178 lb 3.2 oz (80.831 kg) (12/07 0459)  Intake/Output from previous day: 12/06 0701 - 12/07 0700 In: 950 [P.O.:920; I.V.:30] Out: 2200 [Urine:2200]  Physical Exam: Pt is alert and oriented, pleasant elderly male in NAD HEENT: normal Neck: JVP - normal Lungs: CTA bilaterally CV: Tachycardic and regular without murmur Abd: soft, NT, Positive BS, no hepatomegaly Ext: 1+ edema left pretibial area, trace on the right Skin: warm/dry no rash   Lab Results:  Recent Labs  03/14/15 0417 03/15/15 0421  WBC 8.1 8.1  HGB 11.4* 11.5*  PLT 182 197    Recent Labs  03/14/15 0417 03/15/15 0421  NA 136 137  K 4.1 4.7  CL 104 104  CO2 24 24  GLUCOSE 107* 104*  BUN 35* 26*  CREATININE 2.01* 1.76*    Recent Labs  03/12/15 2307 03/13/15 0517  TROPONINI 0.25* 0.27*   Tele: Personally reviewed: Sinus tachycardia, now with heart rate 100 bpm  Assessment/Plan:  1. Acute pulmonary embolus: The patient has been transitioned from IV heparin to Eliquis at DVT/PE dosing. He is treated with full dose since his creatinine clearance is greater than 25. Renal function is improving today with creatinine 1.76. The patient's heart rate is also improved. He had marked RV dysfunction and enlargement on echocardiogram, suggesting large PE burden with RV strain. Fortunately he has been hemodynamically stable throughout. Will mobilize today with anticipation of hospital discharge tomorrow. Will arrange home health RN since the patient lives alone. He also  needs transportation home arranged. Social work consult has been placed.  2. Acute kidney injury on background of chronic kidney disease stage III: Creatinine is stable. Suspect secondary to large pulmonary embolus with subsequent reduced renal perfusion. Appears euvolemic now on exam. Aircraft 3. CAD, native vessel: No symptoms of angina  4. Elevated troponin at low level with flat trend: Suspect demand ischemia in the setting of pulmonary embolus. No indication for coronary evaluation at this time.  Disposition: Anticipate home tomorrow. Will mobilize the patient today and arrange home health and transportation home.  Sherren Mocha, M.D. 03/15/2015, 8:41 AM

## 2015-03-15 NOTE — Progress Notes (Signed)
PT Evaluation Note  Assessment: Pt admitted with below diagnosis. Pt currently with functional limitations due to the deficits listed below (see PT Problem List). Pt's HR up to 141 while ambulating and requires 1 standing rest break due to SOB.  Will need to practice stair training prior to d/c as this was an activity giving him much trouble PTA due to his SOB symptoms.  Pt will benefit from skilled PT to increase their independence and safety with mobility to allow discharge to the venue listed below.    Joslyn Hy PT, DPT [phone] Pager: [phone]   03/15/15 1039  PT Visit Information  Last PT Received On 03/15/15  Assistance Needed +1  History of Present Illness Pt is a 81 y/o M admitted w/ SOB w/ activity and found to have a pulmonary embolus.  Pt's PMH includes Lt kidney cancer, Bil TKA, Lt THA.  Precautions  Precautions Fall  Precaution Comments Lt foot drop, has AFO  Required Braces or Orthoses Other Brace/Splint  Other Brace/Splint Lt AFO  Restrictions  Weight Bearing Restrictions No  Home Living  Family/patient expects to be discharged to: Private residence  Living Arrangements Alone  Available Help at Discharge Friend(s);Available PRN/intermittently  Type of Home House  Home Access Stairs to enter  Entrance Stairs-Number of Steps 10  Entrance Stairs-Rails Left  Home Layout Multi-level;Bed/bath upstairs  Alternate Level Stairs-Number of Steps flight  Alternate Level Stairs-Rails Right  Home Equipment Walker - 2 wheels;Cane - single point;BSC  Additional Comments 6 steps up to kitchen/living room (Rt railing), 10 steps up to bedroom (Rt rail)  Prior Function  Level of Independence Independent with assistive device(s)  Comments Uses cane for uneven terrain, Lt AFO  Communication  Communication No difficulties  Pain Assessment  Pain Assessment No/denies pain  Cognition  Arousal/Alertness Awake/alert  Behavior During Therapy WFL for tasks assessed/performed  Overall  Cognitive Status Within Functional Limits for tasks assessed  Upper Extremity Assessment  Upper Extremity Assessment Overall WFL for tasks assessed  Lower Extremity Assessment  Lower Extremity Assessment LLE deficits/detail  RLE Sensation decreased light touch  LLE Deficits / Details 0/5 DF, otherwise grossly 4/5  LLE Sensation decreased light touch  Bed Mobility  General bed mobility comments Pt sitting EOB upon PT arrival  Transfers  Overall transfer level Needs assistance  Equipment used Straight cane  Transfers Sit to/from Stand  Sit to Stand Supervision  General transfer comment No physical assist or cues needed  Ambulation/Gait  Ambulation/Gait assistance Min guard  Ambulation Distance (Feet) 170 Feet  Assistive device Straight cane  Gait Pattern/deviations Step-through pattern;Decreased dorsiflexion - left;Decreased stride length;Antalgic;Staggering left;Staggering right  General Gait Details Min instability noted.  HR up to 141.  1 standing rest break due to SOB.  Gait velocity interpretation at or above normal speed for age/gender  Stairs (unable to attempt due to pt's tachycardia)  Balance  Overall balance assessment Needs assistance  Sitting-balance support Feet supported  Sitting balance-Leahy Scale Good  Standing balance support Single extremity supported;During functional activity  Standing balance-Leahy Scale Fair  General Comments  General comments (skin integrity, edema, etc.) HR up to 141 w/ ambulatin, down to 114 at rest in chair at end of session.  PT - End of Session  Equipment Utilized During Treatment Gait belt  Activity Tolerance Patient tolerated treatment well;Treatment limited secondary to medical complications (Comment) (tachycardic)  Patient left in chair;with call bell/phone within reach  Nurse Communication Mobility status;Other (comment) (need for AFO, tachycardic)  PT Assessment  PT Therapy Diagnosis  Difficulty walking;Generalized weakness   PT Recommendation/Assessment Patient needs continued PT services  PT Problem List Decreased strength;Decreased range of motion;Decreased activity tolerance;Decreased balance;Decreased mobility;Decreased knowledge of use of DME;Decreased safety awareness;Decreased knowledge of precautions  Barriers to Discharge Inaccessible home environment;Decreased caregiver support  Barriers to Discharge Comments Intermittent assist and many stairs in home  PT Plan  PT Frequency (ACUTE ONLY) Min 3X/week  PT Treatment/Interventions (ACUTE ONLY) DME instruction;Gait training;Stair training;Functional mobility training;Therapeutic activities;Therapeutic exercise;Neuromuscular re-education;Balance training;Patient/family education  PT Recommendation  Follow Up Recommendations Home health PT;Supervision - Intermittent  PT equipment None recommended by PT  Individuals Consulted  Consulted and Agree with Results and Recommendations Patient  Acute Rehab PT Goals  Patient Stated Goal to go home  PT Goal Formulation With patient  Time For Goal Achievement 03/29/15  Potential to Achieve Goals Good  PT Time Calculation  PT Start Time (ACUTE ONLY) 1028  PT Stop Time (ACUTE ONLY) 1055  PT Time Calculation (min) (ACUTE ONLY) 27 min  PT General Charges  $$ ACUTE PT VISIT 1 Procedure  PT Evaluation  $Initial PT Evaluation Tier I 1 Procedure  PT Treatments  $Gait Training 8-22 mins  Written Expression  Dominant Hand Right

## 2015-03-15 NOTE — Progress Notes (Signed)
Foley cath removed. Patient to void. Patient tolerated removal well. [facility] [employer]

## 2015-03-16 NOTE — Progress Notes (Signed)
    Subjective:  No chest pain. Patient ambulated with physical therapy yesterday. States he was very short of breath with activity. No resting dyspnea, orthopnea, or PND.  Objective:  Vital Signs in the last 24 hours: Temp:  [97.2 F (36.2 C)-98.2 F (36.8 C)] 98 F (36.7 C) (12/08 0500) Pulse Rate:  [99-107] 99 (12/08 0500) Resp:  [17-20] 20 (12/08 0500) BP: (98-118)/(57-66) 118/66 mmHg (12/08 0500) SpO2:  [96 %-98 %] 96 % (12/08 0500) Weight:  [178 lb (80.74 kg)] 178 lb (80.74 kg) (12/08 0500)  Intake/Output from previous day: 12/07 0701 - 12/08 0700 In: 57 [P.O.:960] Out: 1050 [Urine:1050]  Physical Exam: Pt is alert and oriented, pleasant elderly male in NAD HEENT: normal Neck: JVP - normal, carotids 2+= without bruits Lungs: CTA bilaterally CV: RRR without murmur or gallop Abd: soft, NT, Positive BS, no hepatomegaly Ext: no C/C/E, distal pulses intact and equal Skin: warm/dry no rash   Lab Results:  Recent Labs  03/14/15 0417 03/15/15 0421  WBC 8.1 8.1  HGB 11.4* 11.5*  PLT 182 197    Recent Labs  03/14/15 0417 03/15/15 0421  NA 136 137  K 4.1 4.7  CL 104 104  CO2 24 24  GLUCOSE 107* 104*  BUN 35* 26*  CREATININE 2.01* 1.76*   No results for input(s): TROPONINI in the last 72 hours.  Invalid input(s): CK, MB  Tele: Personally reviewed: Normal sinus rhythm with heart rate 95 beats per minute  Assessment/Plan:  1. Acute pulmonary embolus 2. Acute kidney injury on background of chronic kidney disease stage III 3. Elevated troponin, likely demand ischemia from pulmonary embolus  The patient is improving. His resting heart rate is down to 95 beats per minute. Breathing is improving. He continues on room air. He remains very dyspneic with activity and lives along. I think he would benefit from another 24 hours in the hospital with plans for discharge tomorrow. I have reviewed the notes of physical therapy and case management. Home health services  will be arranged at discharge. He will continue on chronic oral anticoagulation which is been initiated with Eliquis.   Sherren Mocha, M.D. 03/16/2015, 12:20 PM

## 2015-03-16 NOTE — Consult Note (Signed)
   [facility] CM Inpatient Consult   03/16/2015  Ricardo Soto 1931/07/17 [id number] Patient evaluated for community based chronic disease management services with [place] [facility] as a benefit of patient's [employer] Medicare plan. Spoke with patient at bedside to explain [place] [facility] services. Consent form signed.  Patient endorses that his primary care provider is Dr. Bertram Millard in [place]. Patient states he lives alone and has no relatives in his area.  His daughter is a Marine scientist in [place].   Patient will receive post hospital discharge call and will be evaluated for monthly home visits for assessments and disease process education. Patient expressed he might have difficulty getting home at discharge will notify inpatient care manager, as well.  Left contact information and [facility] literature at bedside. Made Inpatient Case Manager aware that [facility] following. Of note, [facility] Care Management services does not replace or interfere with any services that are arranged by inpatient case management or social work.  For additional questions or referrals please contact:   Natividad Brood, RN BSN [facility] Liaison  [phone] business mobile phone

## 2015-03-17 ENCOUNTER — Other Ambulatory Visit: Payer: Self-pay

## 2015-03-17 DIAGNOSIS — I2699 Other pulmonary embolism without acute cor pulmonale: Principal | ICD-10-CM

## 2015-03-17 LAB — BASIC METABOLIC PANEL
Anion gap: 6 (ref 5–15)
BUN: 23 mg/dL — AB (ref 6–20)
CHLORIDE: 106 mmol/L (ref 101–111)
CO2: 27 mmol/L (ref 22–32)
Calcium: 9.1 mg/dL (ref 8.9–10.3)
Creatinine, Ser: 1.66 mg/dL — ABNORMAL HIGH (ref 0.61–1.24)
GFR calc Af Amer: 43 mL/min — ABNORMAL LOW (ref >60)
GFR calc non Af Amer: 37 mL/min — ABNORMAL LOW (ref >60)
Glucose, Bld: 91 mg/dL (ref 65–99)
POTASSIUM: 5 mmol/L (ref 3.5–5.1)
SODIUM: 139 mmol/L (ref 135–145)

## 2015-03-17 MED ORDER — ROSUVASTATIN CALCIUM 10 MG PO TABS: 10.0000 mg | ORAL_TABLET | Freq: Every day | ORAL | Status: DC

## 2015-03-17 MED ORDER — APIXABAN 5 MG PO TABS: 10.0000 mg | ORAL_TABLET | Freq: Two times a day (BID) | ORAL | Status: DC

## 2015-03-17 MED ORDER — APIXABAN 5 MG PO TABS: 5.0000 mg | ORAL_TABLET | Freq: Two times a day (BID) | ORAL | Status: DC

## 2015-03-17 NOTE — Discharge Summary (Addendum)
Physician Discharge Summary    Cardiologist:  Crenshaw Patient ID: Ricardo Soto MRN: [id number] DOB/AGE: [birth year] 81 y.o.  Admit date: 03/12/2015 Discharge date: 03/17/2015  Admission Diagnoses:Acute pulmonary embolism.   Discharge Diagnoses:  Principal Problem:   Acute pulmonary embolism ([place]) Active Problems:   HYPERCHOLESTEROLEMIA-PURE   Essential hypertension   CKD (chronic kidney disease), stage III   [place]and Ischemia   Acute diastolic CHF (right-sided/cor pulmonale) secondary to PE   acute on chronic stage III kidney disease  Discharged Condition: stable  Hospital Course:    Ricardo Soto is an 81 y/o M with history of CAD s/p overlapping BMS-LAD 03/2005, HLD, BPH, renal CA s/p left nephrectomy, CKD, HTN, remote tobacco abuse who presented to [facility] ER today with SOB. Last cath appears to be at time of PCI in 2006 when Ricardo Soto had PCI to LAD; otherwise had normal Cx and nonobstructive disease in RCA (30-40% multiple discrete lesions prox RCa, 50% mRCA). Last nuc 10/2008 was normal, EF 59%. Cr in 04/2014 was 1.5.  Ricardo Soto presented to [facility] ER today with SOB. Ricardo Soto reports being in good health and very healthy until recently. Several weeks ago, Ricardo Soto developed URI symptoms. These have slowly improved with treatment of an antibiotic. Ricardo Soto noticed on Friday that Ricardo Soto had abrupt onset of exertional SOB. This was noticed when climbing stairs. Ricardo Soto denies associated chest pain or other symptoms. On Saturday, Ricardo Soto noticed that Ricardo Soto had dyspnea when walking up stairs. Ricardo Soto also noticed progressive SOB with activity. Ricardo Soto symptoms would resolve within sitting for a minute. Overnight, Ricardo Soto symptoms have worsened. Ricardo Soto reports extreme breathlessness and a sense that Ricardo Soto might pass out because of SOB. Ricardo Soto reports having no sob with resting or lying down. Ricardo Soto had the exact same symptoms in 2006 at which time Ricardo Soto had Ricardo Soto stents placed. Ricardo Soto has chronic BLE edema. Ricardo Soto thinks that venous insufficiency may  be a little worse as Ricardo Soto has not been as active over the past few weeks. Ricardo Soto has not noticed weight gain.  At [facility], Ricardo Soto was noted to be tachycardic in the 110s. Labwork @ [facility] revealed WBC 9.2, Hgb 12.5, Hct 37.1, Plt 191, Na 140, K 4.7, Cl 106, CO2 21, BUN 39, Cr 1.9, glucose 121. PH 7.480, PCo2 26, PO2 62, Bicarb 19. LFTs WNL. Troponin 0.37 (ref range <0.04), pBNP 24200 (normal 0-1800), albumin 3.7, IN R 1.1. CXR: "coarsened perihilar and bibasilar interstitial markings, with perhaps mild central pulm vascular congestion suggesting edema related to either bronchitic change or mild volume overload. No PNA. No Pleural effusion. Hyperexpanded lungs suggestive of COPD." Ricardo Soto received 325mg  aspirin prior to transfer too. Consultation note indicates giving him 40mg  IV Lasix prior to transfer but MAR reports a dose of 20mg  IV at 11:27 (there is also a 60mg  dose listed but says Not Given).  At admission Ricardo Soto denied symptoms of palpitations, chest pain, orthopnea, PND, presyncope, syncope, or neurologic sequela. The patient is tolerating medications without difficulties and is otherwise without complaints.  Ricardo Soto was started on IV heparin. Ricardo Soto was continued on aspirin, beta blocker, statin.  Lasix and ACE inhibitor were held in anticipation of left heart catheterization. 2-D echocardiogram revealed ejection fraction 45-50% with diffuse hypokinesis, grade 1 diastolic dysfunction. D-shaped interventricular ventricular septum suggestive of increased RV pressure and volume overload. Right ventricle was severely dilated. Right atrium was moderately dilated.  There is concern based on echo for pulmonary embolism. Ricardo Soto underwent VQ scan and lower extremity venous Dopplers. V/Q scan revealed intermediate probability  for pulmonary embolism.  These findings were likely the cause of Ricardo Soto troponin elevation. Venous Dopplers was consistent with acute deep DVT in the right gastrocnemus, vein left femoral vein and left popliteal  vein, left posterior tibial vein, left peroneal vein, and left gastrocnemus. Ricardo Soto was started on Eliquis 10 mg twice daily for 7 days to be converted to 5 mg twice daily thereafter. Creatinine clearance was greater than 25. Improvement in serum creatinine down from 2.08-1.66.   The patient was seen by Dr. Burt Knack who felt Ricardo Soto was stable for DC home.    Consults: Physical therapy  Significant Diagnostic Studies:  Study Conclusions  - Left ventricle: The cavity size was below normal. Wall thickness was normal. Systolic function was mildly reduced. The estimated ejection fraction was in the range of 45% to 50%. Diffuse hypokinesis. Doppler parameters are consistent with abnormal left ventricular relaxation (grade 1 diastolic dysfunction). - Ventricular septum: D-shaped interventricular septum suggestive of RV pressure/volume overload. - Aortic valve: There was no stenosis. - Mitral valve: There was no significant regurgitation. - Right ventricle: The cavity size was severely dilated. Systolic function was moderately reduced. - Right atrium: The atrium was moderately dilated. - Atrial septum: The septum bowed from right to left, consistent with increased right atrial pressure. - Tricuspid valve: Peak RV-RA gradient (S): 40 mm Hg. - Pulmonary arteries: PA peak pressure: 55 mm Hg (S). - Systemic veins: IVC measured 2.5 cm with < 50% respirophasic variation, suggesting RA pressure 15 mmHg.  Impressions:  - The LV was small, compressed by the RV. EF estimate 45-50% with diffuse hypokinesis. D-shaped interventricular septum suggesting RV pressure/volume overload. Severely dilated RV with moderate systolic dysfunction. Moderate pulmonary hypertension. Would consider PE.  Treatments: See above  Discharge Exam: Blood pressure 116/80, pulse 90, temperature 98.3 F (36.8 C), temperature source Oral, resp. rate 18, height 5\' 11"  (1.803 m), weight 175 lb 12.8 oz (79.742  kg), SpO2 97 %. Well nourished, well developed, in no acute distress HEENT: Pupils are equal round react to light accommodation extraocular movements are intact.  Neck: no JVDNo cervical lymphadenopathy. Cardiac: Regular rate and rhythm without murmurs rubs or gallops. Lungs:  clear to auscultation bilaterally, no wheezing, rhonchi or rales Ext: Trace left lower extremity edema.  2+ radial pulses. Skin: warm and dry Neuro:  Grossly normal  \  Disposition: 01-Home or Self Care      Discharge Instructions    AMB Referral to [facility] Management    Complete by:  As directed   Reason for consult:  Post hospital follow up - lives in [place] - minimal family and support in area  Expected date of contact:  1-3 days (reserved for hospital discharges)  Please assign to community nurse for transition of care calls and assess for home visits. Patient will have home health care, also per inpatient RNCM.  Please check for transportation needs for medical appointments.   Questions please call: Natividad Brood, RN BSN [facility] Liaison  [phone] business mobile phone     Diet - low sodium heart healthy    Complete by:  As directed      Increase activity slowly    Complete by:  As directed             Medication List    STOP taking these medications        aspirin 81 MG EC tablet     lisinopril 5 MG tablet  Commonly known as:  PRINIVIL,ZESTRIL  TAKE these medications        acetaminophen 500 MG tablet  Commonly known as:  TYLENOL  Take 500 mg by mouth as needed.     apixaban 5 MG Tabs tablet  Commonly known as:  ELIQUIS  Take 2 tablets (10 mg total) by mouth 2 (two) times daily.     apixaban 5 MG Tabs tablet  Commonly known as:  ELIQUIS  Take 1 tablet (5 mg total) by mouth 2 (two) times daily.  Start taking on:  03/21/2015     FISH OIL CONCENTRATE PO  Take 2 tablets by mouth daily.     folic acid 1 MG tablet  Commonly known as:  FOLVITE  Take  1 mg by mouth daily.     metoprolol succinate 25 MG 24 hr tablet  Commonly known as:  TOPROL-XL  TAKE 1 TABLET EVERY DAY     multivitamin tablet  Joint supplement  1 tab po qd     rosuvastatin 10 MG tablet  Commonly known as:  CRESTOR  Take 1 tablet (10 mg total) by mouth daily.       Follow-up Information    Follow up with [facility].   Why:  Registered Nurse and Physical Therapy   Contact information:   [address] [place] [address] [phone]       Follow up with Tarri Fuller, PA-C On 03/17/2015.   Specialties:  Physician Assistant, Radiology, Interventional Cardiology   Why:  11:30 AM   Contact information:   [address] [place] [address] [phone]      Greater than 30 minutes was spent completing the patient's discharge.   SignedTarri Fuller, [place] 03/17/2015, 2:17 PM  Patient seen, examined. Available data reviewed. Agree with findings, assessment, and plan as outlined by Tarri Fuller, PA-C. I agree with Ricardo Soto exam documentation as above. There are no changes in Ricardo Soto physical exam from the previous day's findings. Ricardo Soto lung fields are clear and heart is regular rate and rhythm. There is mild edema in the left leg greater than trace to 1+. Review of Ricardo Soto laboratory data shows improving acute kidney injury now with creatinine back to baseline. The patient will continue on Eliquis for treatment of acute pulmonary embolus. See above for follow-up information. Ricardo Soto is stable for discharge today.  Chart is reviewed for accuracy of hospital diagnoses. Ricardo Soto presented with acute pulmonary embolus with cor pulmonale (acute diastolic CHF/right-sided). Elevated cardiac enzymes are secondary to demand ischemia from PE rather than true NSTEMI.  Sherren Mocha, M.D. 03/17/2015 2:50 PM

## 2015-03-17 NOTE — Clinical Social Work Note (Signed)
Clinical Social Worker received referral to assist patient with transportation home.  Per RNCM, patient has made arrangements for transport at discharge today.  Clinical Social Worker will sign off for now as social work intervention is no longer needed. Please consult [place] again if new need arises.  Barbette Or, [place]

## 2015-03-17 NOTE — Progress Notes (Signed)
Pt is scheduled for d/c home today. Spoke with the patient and he reports he is ready for d/c home and he will have HHPT services.

## 2015-03-17 NOTE — Progress Notes (Signed)
ANTICOAGULATION CONSULT NOTE - Initial Consult  Pharmacy Consult for apixaban Indication: acute PE/DVT  No Known Allergies  Patient Measurements: Height: 5\' 11"  (180.3 cm) Weight: 175 lb 12.8 oz (79.742 kg) IBW/kg (Calculated) : 75.3 Heparin Dosing Weight: 82 kg  Vital Signs: Temp: 98.3 F (36.8 C) (12/09 0500) Temp Source: Oral (12/09 0500) BP: 116/80 mmHg (12/09 0955) Pulse Rate: 90 (12/09 0955)  Labs:  Recent Labs  03/14/15 1341 03/15/15 0421 03/17/15 0343  HGB  --  11.5*  --   HCT  --  36.3*  --   PLT  --  197  --   HEPARINUNFRC 0.42 >2.20*  --   CREATININE  --  1.76* 1.66*    Estimated Creatinine Clearance: 37.2 mL/min (by C-G formula based on Cr of 1.66).  Assessment: 81 yo male presented initially to [facility] ED with SOB. Trop elevated. Pharmacy consulted to dose heparin for ACS. No anticoag pta. Now with acute PE/DVT. Heparin transitioned to apixaban. SCr improving to 1.66 today, CrCl~37. Hg 11.5 stable, ptl wnl. No bleed documented.  No dose adjustment for treatment-dose apixaban but not recommended if SCr>2.5, CrCl<25 ml/min.  Goal of Therapy:  Adequate PE/DVT tx  Monitor platelets by anticoagulation protocol: Yes   Plan:  Apixaban 10mg  bid x 7 days; then 5mg  bid Monitor CBC, s/sx of bleeding, renal function  Elicia Lamp, PharmD, [facility] Clinical Pharmacist Pager [phone] 03/17/2015 10:52 AM

## 2015-03-20 ENCOUNTER — Encounter: Payer: Self-pay | Admitting: *Deleted

## 2015-03-20 ENCOUNTER — Other Ambulatory Visit: Payer: Self-pay | Admitting: *Deleted

## 2015-03-20 NOTE — Patient Outreach (Signed)
[facility]) Care Management  03/20/2015  Ricardo Soto 1931/06/02 [id number]   Initial Transition of care call  Telephone call to [place], reports that he is doing just fine, patient was discharged from the hospital on 12/9 Dx: Pulmonary Embolus. Patient reports that he is taking all of his medication as prescribed, he is able to verbalize that he starts different Eliquis dosing on 12/13 5 mg two time daily. Patient reports that he uses a cane when walking out to the mailbox, denies shortness of breath, denies pain,  also states that he has been able to drive himself to the grocery store, states "they didn't say I couldn't drive, just take it easy".   Ricardo Soto reports that the [facility] RN has visited him on today, physical therapy has not started yet. Patient has a scheduled post hospital visit scheduled at Cardiology office with Ricardo Soto on Friday, December 16.  Provided my contact information with patient, encouraged him to call MD for concerns, increased shortness, pain, and call 911 for emergency. I have offered a home visit , patient declined stating that he is managing well, and home health is visiting, patient is agreeable to follow up phone call on next week.  Plan: Explained the Transition of care program Continue follow up call on Tuesday, December 20 Encourage patient to continue to take medication as prescribed. Will send letter of involvement to PCP, Ricardo Soto  [facility] CM Care Plan Problem One        Most Recent Value   Care Plan Problem One  Recent hospital admission for Pulmonary Embolus at risk for readmission   Role Documenting the Problem One  Care Management [place] for Problem One  Active   [facility] Long Term Goal (31-90 days)  Patient will report no hospital readmission in the next 31 days   [facility] Long Term Goal Start Date  03/20/15   Interventions for Problem One Long Term Goal  Reviewed importance of taking  medications as prescribed, notifying MD office of increased shortness of breath, pain, dizziness or any new symptoms   [facility] CM Short Term Goal #1 (0-30 days)  Patient will attend MD visit within 7 days of discharge   [facility] CM Short Term Goal #1 Start Date  03/20/15   Interventions for Short Term Goal #1  Discussed importance of keeping already scheduled appt with cardiology office on 12/16     Joylene Draft, RN, [facility] Management [phone]- Mobile [phone]- [place]

## 2015-03-23 ENCOUNTER — Telehealth: Payer: Self-pay | Admitting: Cardiology

## 2015-03-23 NOTE — Telephone Encounter (Signed)
LM on [place] phone to called back.  Pt has appt on 12/16 for post-hospital f/u and PT needs and orders can be addressed during/after this appt.

## 2015-03-23 NOTE — Telephone Encounter (Signed)
°  New Prob   Calling for verbal orders for physical therapy for 2 weeks once a week. Please call.

## 2015-03-24 ENCOUNTER — Encounter: Payer: Self-pay | Admitting: Physician Assistant

## 2015-03-24 ENCOUNTER — Ambulatory Visit (INDEPENDENT_AMBULATORY_CARE_PROVIDER_SITE_OTHER): Payer: PPO | Admitting: Physician Assistant

## 2015-03-24 VITALS — BP 120/84 | HR 72 | Ht 71.0 in | Wt 184.8 lb

## 2015-03-24 DIAGNOSIS — I2510 Atherosclerotic heart disease of native coronary artery without angina pectoris: Secondary | ICD-10-CM

## 2015-03-24 DIAGNOSIS — I10 Essential (primary) hypertension: Secondary | ICD-10-CM

## 2015-03-24 DIAGNOSIS — I2699 Other pulmonary embolism without acute cor pulmonale: Secondary | ICD-10-CM | POA: Diagnosis not present

## 2015-03-24 DIAGNOSIS — E785 Hyperlipidemia, unspecified: Secondary | ICD-10-CM

## 2015-03-24 NOTE — Telephone Encounter (Signed)
SPOKE TO Ricardo Soto (PT)  VERBAL ORDER GIVEN FOR PHYSICAL THERAPY, PER BRYAN  HAGER PA VERBALIZED UNDERSTANDING.

## 2015-03-24 NOTE — Patient Instructions (Signed)
Your physician recommends that you schedule a follow-up appointment in: February with Dr Stanford Breed. No changes were made today in your therapy.

## 2015-03-24 NOTE — Progress Notes (Signed)
Patient ID: Ricardo Soto, male   DOB: May 28, 1931, 81 y.o.   MRN: [id number]    Date:  03/24/2015   ID:  Ricardo Soto, DOB June 13, 1931, MRN [id number]  PCP:  Mateo Flow, MD  Primary Cardiologist:  Stanford Breed   Chief Complaint  Patient presents with  . post hospital    1 week//pt states no Sx.     History of Present Illness: Ricardo Soto is a 81 y.o. male  Mr. Haris is an 82 y/o M with history of CAD s/p overlapping BMS-LAD 03/2005, HLD, BPH, renal CA s/p left nephrectomy, CKD, HTN, remote tobacco abuse. Last cath appears to be at time of PCI in 2006 when he had PCI to LAD; otherwise had normal Cx and nonobstructive disease in RCA (30-40% multiple discrete lesions prox RCa, 50% mRCA). Last nuc 10/2008 was normal, EF 59%. Cr in 04/2014 was 1.5. He presented to [facility] ER today with SOB. 2-D echocardiogram revealed ejection fraction 45-50% with diffuse hypokinesis, grade 1 diastolic dysfunction. D-shaped interventricular ventricular septum suggestive of increased RV pressure and volume overload. Right ventricle was severely dilated. Right atrium was moderately dilated. He underwent VQ scan and lower extremity venous Dopplers. V/Q scan revealed intermediate probability for pulmonary embolism. These findings were likely the cause of his troponin elevation. Venous Dopplers was consistent with acute deep DVT in the right gastrocnemus, vein left femoral vein and left popliteal vein, left posterior tibial vein, left peroneal vein, and left gastrocnemus. He was started on Eliquis 10 mg twice daily for 7 days to be converted to 5 mg twice daily thereafter. Creatinine clearance was greater than 25. Improvement in serum creatinine down from 2.08-1.66.   Patient presents today for posthospital follow-up. He reports doing the much better. He was able to walk on the mailbox and back without any problems. As of the 13th of this month, he changed from 10 mg of eliquis  twice a day and to 5 mg twice a day.  He does have a little bit of left lower extremity edema still.  The patient currently denies nausea, vomiting, fever, chest pain, shortness of breath, orthopnea, dizziness, PND, cough, congestion, abdominal pain, hematochezia, melena, claudication.  Wt Readings from Last 3 Encounters:  03/24/15 184 lb 12.8 oz (83.825 kg)  03/17/15 175 lb 12.8 oz (79.742 kg)  06/01/14 195 lb 6.4 oz (88.633 kg)     Past Medical History  Diagnosis Date  . CAD (coronary artery disease)     a. 2006: s/p overlapping BMS-LAD 03/2005.otherwise had normal Cx and nonobstructive disease in RCA (30-40% multiple discrete lesions prox RCa, 50% mRCA). b. Neg nuc 2010.  Marland Kitchen HLD (hyperlipidemia)   . BPH (benign prostatic hypertrophy)     s/p TURP  . CKD (chronic kidney disease), stage III   . Cancer [facility])     a. left kidney - s/p left nephrectomy.  . Arthritis     Osteo  . Kidney stone   . Essential hypertension   . Acute pulmonary embolism ([place]) 03/15/2015    Current Outpatient Prescriptions  Medication Sig Dispense Refill  . acetaminophen (TYLENOL) 500 MG tablet Take 500 mg by mouth as needed.    Marland Kitchen apixaban (ELIQUIS) 5 MG TABS tablet Take 1 tablet (5 mg total) by mouth 2 (two) times daily. 60 tablet 11  . Coenzyme Q10-Fish Oil-Vit E (CO-Q 10 OMEGA-3 FISH OIL PO) Take 2 capsules by mouth daily.    . folic acid (FOLVITE) 1 MG tablet Take 1 mg  by mouth daily.  3  . metoprolol succinate (TOPROL-XL) 25 MG 24 hr tablet TAKE 1 TABLET EVERY DAY 30 tablet 0  . Multiple Vitamin (MULTIVITAMIN) tablet Joint supplement  1 tab po qd    . rosuvastatin (CRESTOR) 10 MG tablet Take 1 tablet (10 mg total) by mouth daily. 30 tablet 11   No current facility-administered medications for this visit.    Allergies:   No Known Allergies  Social History:  The patient  reports that he quit smoking about 32 years ago. He does not have any smokeless tobacco history on file. He reports that he drinks  about 1.8 oz of alcohol per week. He reports that he does not use illicit drugs.   Family history:   Family History  Problem Relation Age of Onset  . Heart disease      GP  . Heart attack      GP     ROS:  Please see the history of present illness.  All other systems reviewed and negative.   PHYSICAL EXAM: VS:  BP 120/84 mmHg  Pulse 72  Ht 5\' 11"  (1.803 m)  Wt 184 lb 12.8 oz (83.825 kg)  BMI 25.79 kg/m2 Well nourished, well developed, in no acute distress HEENT: Pupils are equal round react to light accommodation extraocular movements are intact.  Neck: no JVDNo cervical lymphadenopathy. Cardiac: Regular rate and rhythm without murmurs rubs or gallops. Lungs:  clear to auscultation bilaterally, no wheezing, rhonchi or rales Abd: soft, nontender, positive bowel sounds all quadrants, no hepatosplenomegaly Ext: 1+ left lower extremity edema.  2+ radial and dorsalis pedis pulses. Skin: warm and dry Neuro:  Grossly normal  Lipid Panel     Component Value Date/Time   CHOL 132 03/13/2015 0517   TRIG 104 03/13/2015 0517   HDL 36* 03/13/2015 0517   CHOLHDL 3.7 03/13/2015 0517   VLDL 21 03/13/2015 0517   LDLCALC 75 03/13/2015 0517     ASSESSMENT AND PLAN:  Problem List Items Addressed This Visit    Hyperlipidemia   Essential hypertension - Primary   CAD in native artery   Acute pulmonary embolism ([place])     Patient appears be recovering nicely from his pulmonary embolism. His activity level is greatly improved. The day after leaving the hospital he walked down to the mailbox and back without having to stop without any shortness of breath. He is now taking 5 mg of eliquis twice a day. His blood pressures well-controlled.  He denies any chest pain. Continues to Crestor and his last LDL was 75.  Follow-up in 3 months. We had a curl call earlier today and home health nurse recommended a physical therapy.Marland Kitchen

## 2015-03-28 ENCOUNTER — Other Ambulatory Visit: Payer: Self-pay | Admitting: *Deleted

## 2015-03-28 NOTE — Patient Outreach (Signed)
[facility]) Care Management  03/28/2015  Ricardo Soto 1931-09-13 [id number]   Transition of care week 2  Spoke with Ricardo Soto by telephone, reports that he is doing well, denies chest pain shortness of breath patient reports tolerating activity at home without problems and he is able to drive to appointments. Patient reports that physical therapy saw him on last week and they have a home visit scheduled for today. Patient continues to use a cane when walking outside of his home. Patient had office visit with his PCP , Dr.Khan on yesterday. Patient continues to take his medications as prescribed without problems.  Ricardo Soto denies any new symptoms or concerns at this time, encouraged him to notify MD of concerns and 911 for emergencies. Patient my contact information if needed for questions or concerns. Plan Home visit scheduled for December 28 at [address] [place], [place] Management [phone] Mobile [phone]- [place] .

## 2015-04-05 ENCOUNTER — Other Ambulatory Visit: Payer: Self-pay | Admitting: *Deleted

## 2015-04-05 ENCOUNTER — Encounter: Payer: Self-pay | Admitting: *Deleted

## 2015-04-05 NOTE — Patient Outreach (Signed)
[facility]) Care Management   04/05/2015  Ricardo Soto Oct 13, 1931 [id number]  Ricardo Soto is an 81 y.o. male   Transition of care week 3, Initial home visit  Subjective: " I am doing great, I have graduated from my physical therapy"  Objective:   Review of Systems  Constitutional: Negative.   HENT: Negative.   Eyes: Negative.   Respiratory: Negative.   Cardiovascular: Negative.  Negative for chest pain and leg swelling.  Gastrointestinal: Negative.   Musculoskeletal: Negative for falls.       Wears brace to left leg, walks with a limp  Skin: Negative.   Neurological: Negative.   Psychiatric/Behavioral: Negative for depression. The patient is not nervous/anxious.    BP 116/70 mmHg  Pulse 72  Resp 18  SpO2 97% Physical Exam  Constitutional: He is oriented to person, place, and time. He appears well-developed and well-nourished.  Cardiovascular: Normal rate, regular rhythm, normal heart sounds and intact distal pulses.   Respiratory: Effort normal and breath sounds normal.  Neurological: He is alert and oriented to person, place, and time.  Skin: Skin is warm and dry.  Psychiatric: He has a normal mood and affect. His behavior is normal. Judgment and thought content normal.    Current Medications:   Current Outpatient Prescriptions  Medication Sig Dispense Refill  . acetaminophen (TYLENOL) 500 MG tablet Take 500 mg by mouth as needed.    Marland Kitchen apixaban (ELIQUIS) 5 MG TABS tablet Take 1 tablet (5 mg total) by mouth 2 (two) times daily. 60 tablet 11  . Coenzyme Q10-Fish Oil-Vit E (CO-Q 10 OMEGA-3 FISH OIL PO) Take 2 capsules by mouth daily.    . folic acid (FOLVITE) 1 MG tablet Take 1 mg by mouth daily.  3  . metoprolol succinate (TOPROL-XL) 25 MG 24 hr tablet TAKE 1 TABLET EVERY DAY 30 tablet 0  . Multiple Vitamin (MULTIVITAMIN) tablet Joint supplement  1 tab po qd    . rosuvastatin (CRESTOR) 10 MG tablet Take 1 tablet (10 mg total) by  mouth daily. 30 tablet 11   No current facility-administered medications for this visit.    Functional Status:   In your present state of health, do you have any difficulty performing the following activities: 04/05/2015 03/13/2015  Hearing? N N  Vision? N N  Difficulty concentrating or making decisions? N N  Walking or climbing stairs? N Y  Dressing or bathing? N N  Doing errands, shopping? N N  Preparing Food and eating ? N -  Using the Toilet? N -  In the past six months, have you accidently leaked urine? N -  Do you have problems with loss of bowel control? N -  Managing your Medications? N -  Managing your Finances? N -  Housekeeping or managing your Housekeeping? N -   Fall Risk  04/05/2015  Falls in the past year? No   Fall/Depression Screening:    PHQ 2/9 Scores 04/05/2015  PHQ - 2 Score 0    Assessment:   Initial home visit, patient reports managing well at home, he still drives,prepares his own meals, does his own housekeeping and yard work.  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 Patient has 10 steps up to his bedroom that he manages several times daily without reported problems "I just take it slow and easy, I haven't had a fall in years".  Recent Hospital Discharge: Patient reports taking his medication without problems, he is able to state proper use of each medication, he uses a pill organizer for medications. Patient reports that he lost 15 pounds during the time of his illness and hospitalization but states his appetite is great, and he has slowly began to put back on a few pounds today's weight is 181 states his normal weights range from 180 to 185, no edema. Patient does have scales to  weigh on, discussed weighing daily and keeping track of weights. Patient denies shortness of breath chest pain , discomfort and verbalized understanding the importance of notifying MD of new symptoms or concerns, such as shortness of breath, swelling, sudden weight gain, pain. Mr.Ricardo Soto states his blood pressure normally ranges 105-120/50-70, patient does have a blood pressure monitor at home,demonstrates proper use, encouraged to check his blood pressure and record results.    Plan:  Patient will continue to weigh daily and record, notify MD of sudden weight gain, shortness of breath, swelling Patient will check his blood pressure at least twice weekly and notify MD of symptoms or blood pressure reading outside his normal range I will send Dr.Khan this initial visit note Continue transition of care call on next week, January 4 at 11:00 am  Ricardo [facility] CM Care Plan Problem One        Most Recent Value   Care Plan Problem One  Recent hospital admission for Pulmonary Embolus at risk for readmission   Role Documenting the Problem One  Care Management [place] for Problem One  Active   [facility] Long Term Goal (31-90 days)  Patient will report no hospital readmission in the next 31 days   [facility] Long Term Goal Start Date  03/20/15   Interventions for Problem One Long Term Goal  Reviewed importance of taking medications as prescribed, notifying MD office of increased shortness of breath, pain, dizziness or any new symptoms   [facility] CM Short Term Goal #1 (0-30 days)  Patient will attend MD visit within 7 days of discharge   [facility] CM Short Term Goal #1 Start Date  03/20/15   [facility] CM Short Term Goal #1 Met Date  03/28/15   [facility] CM Short Term Goal #2 (0-30 days)  Patient will weigh daily and record in the next 30 days   [facility] CM Short Term Goal #2 Start Date  04/05/15   Interventions for Short Term Goal #2  verified that patient has working scales, instructed patient in recording weights in [facility] calendar  book,    [facility] CM Short Term Goal #3 (0-30 days)  Patient will check his blood pressure at least 2 days weekly and record in the next 30 days   [facility] CM Short Term Goal #3 Start Date  04/05/15   Interventions for Short Tern Goal #3  Patient demonstrates how to accurately use his blood pressure monitor to check his blood pressure, instructed to call for blood pressures outside his normal ranges, and take reading to next MD appointment Review Emmi Handout on Hypertension      Joylene Draft, RN, [place] [facility] [phone]- Mobile [phone]- [place]

## 2015-04-12 ENCOUNTER — Other Ambulatory Visit: Payer: Self-pay | Admitting: *Deleted

## 2015-04-12 NOTE — Patient Outreach (Signed)
[place] [facility]) Care Management  04/12/2015  Ricardo S[facility] 06-28-1931 [id number]  Transition of care week 4   Spoke with [name], he reports that "everything is going great". Patient continues to weigh daily and record today's weight is 180, patient states that his weights stay between 179 and 181, he denies increased swelling, chest pain or shortness of breath. Patient also monitors his blood pressure and reports that reading or between 115/65 to 125/64.  [name] continues to tolerate activity without problems, managing activities of daily living with difficulties  Plan Continue transition of care call on next week, and transfer to Health [facility] for further education on CAD, hypertension management.  Joylene Draft, RN, [place] Management [phone]- Mobile [phone]- Toll Free Main Office

## 2015-04-19 ENCOUNTER — Other Ambulatory Visit: Payer: Self-pay | Admitting: *Deleted

## 2015-04-19 NOTE — Patient Outreach (Signed)
[facility]) Care Management  04/19/2015  Monica Becton Kruser Oct 03, 1931 [id number]

## 2015-04-19 NOTE — Patient Outreach (Addendum)
[place] [facility]) Care Management  04/19/2015  Ricardo S[facility] 11-28-1931 [id number]   Transition of care call week 5  Unsuccessful attempt to reach patient by telephone after attempts x 2 on today, I was able to leave a HIPAA compliant message.   1700 Received return call from [place], patient reports that he is managing well at home , he continues to weigh daily no increases weight staying steady at 179 to 180. Patients denies any chest pain or shortness of breath. His blood pressures at staying in the 120-128/70's. Patient continues to take his medication as prescribed.  Assessment: Patient has continued to progress well during transition of care program and managing well at home,  Patient has my contact information in case questions or concerns arise, and understands to call MD for new symptoms,911 for emergencies.  Plan Will follow up with patient with a home visit after the transition of care period and then discuss case closure. Will meet patient in home on 1/30. Patient will continue to monitor weights and blood pressure and record.     Joylene Draft, RN, [place] Management [phone]- Mobile [phone]- Toll Free Main Office

## 2015-05-02 DIAGNOSIS — I10 Essential (primary) hypertension: Secondary | ICD-10-CM | POA: Diagnosis not present

## 2015-05-02 DIAGNOSIS — Z7901 Long term (current) use of anticoagulants: Secondary | ICD-10-CM | POA: Diagnosis not present

## 2015-05-02 DIAGNOSIS — E7800 Pure hypercholesterolemia, unspecified: Secondary | ICD-10-CM | POA: Diagnosis not present

## 2015-05-02 DIAGNOSIS — N400 Enlarged prostate without lower urinary tract symptoms: Secondary | ICD-10-CM | POA: Diagnosis not present

## 2015-05-02 DIAGNOSIS — E785 Hyperlipidemia, unspecified: Secondary | ICD-10-CM | POA: Diagnosis not present

## 2015-05-02 DIAGNOSIS — Z0000 Encounter for general adult medical examination without abnormal findings: Secondary | ICD-10-CM | POA: Diagnosis not present

## 2015-05-02 DIAGNOSIS — N183 Chronic kidney disease, stage 3 (moderate): Secondary | ICD-10-CM | POA: Diagnosis not present

## 2015-05-02 DIAGNOSIS — I2510 Atherosclerotic heart disease of native coronary artery without angina pectoris: Secondary | ICD-10-CM | POA: Diagnosis not present

## 2015-05-02 DIAGNOSIS — Z23 Encounter for immunization: Secondary | ICD-10-CM | POA: Diagnosis not present

## 2015-05-02 DIAGNOSIS — I2699 Other pulmonary embolism without acute cor pulmonale: Secondary | ICD-10-CM | POA: Diagnosis not present

## 2015-05-02 DIAGNOSIS — Z905 Acquired absence of kidney: Secondary | ICD-10-CM | POA: Diagnosis not present

## 2015-05-02 DIAGNOSIS — Z79899 Other long term (current) drug therapy: Secondary | ICD-10-CM | POA: Diagnosis not present

## 2015-05-08 ENCOUNTER — Encounter: Payer: Self-pay | Admitting: *Deleted

## 2015-05-08 ENCOUNTER — Other Ambulatory Visit: Payer: Self-pay | Admitting: *Deleted

## 2015-05-08 NOTE — Patient Outreach (Signed)
[place] [facility]) Care Management   05/08/2015  Ricardo Soto 04-16-1931 [id number]  Ricardo Soto is an 82 y.o. male  Subjective: " I am doing pretty good, being a little more active"  Objective:   Review of Systems  Constitutional: Negative.   HENT: Negative.   Eyes: Negative.   Respiratory: Negative.   Cardiovascular: Positive for leg swelling. Negative for chest pain.       Left leg 2+ swelling  Gastrointestinal: Negative.   Genitourinary: Negative.   Musculoskeletal: Negative.  Negative for falls.  Skin: Negative.   Neurological: Negative.   Psychiatric/Behavioral: Negative.   BP 120/70 mmHg  Pulse 70  Resp 18  SpO2 98%  Physical Exam  Constitutional: He is oriented to person, place, and time. He appears well-developed and well-nourished.  Cardiovascular: Normal rate, normal heart sounds and intact distal pulses.   Respiratory: Effort normal and breath sounds normal. No respiratory distress.  GI: Soft.  Neurological: He is alert and oriented to person, place, and time.  Skin: Skin is warm, dry and intact.     Psychiatric: He has a normal mood and affect. His behavior is normal. Judgment and thought content normal.    Current Medications:   Current Outpatient Prescriptions  Medication Sig Dispense Refill  . acetaminophen (TYLENOL) 500 MG tablet Take 500 mg by mouth as needed.    Marland Kitchen apixaban (ELIQUIS) 5 MG TABS tablet Take 1 tablet (5 mg total) by mouth 2 (two) times daily. 60 tablet 11  . Coenzyme Q10-Fish Oil-Vit E (CO-Q 10 OMEGA-3 FISH OIL PO) Take 2 capsules by mouth daily.    . folic acid (FOLVITE) 1 MG tablet Take 1 mg by mouth daily.  3  . metoprolol succinate (TOPROL-XL) 25 MG 24 hr tablet TAKE 1 TABLET EVERY DAY 30 tablet 0  . Multiple Vitamin (MULTIVITAMIN) tablet Joint supplement  1 tab po qd    . rosuvastatin (CRESTOR) 10 MG tablet Take 1 tablet (10 mg total) by mouth daily. 30 tablet 11   No current facility-administered  medications for this visit.    Functional Status:   In your present state of health, do you have any difficulty performing the following activities: 04/05/2015 03/13/2015  Hearing? N N  Vision? N N  Difficulty concentrating or making decisions? N N  Walking or climbing stairs? N Y  Dressing or bathing? N N  Doing errands, shopping? N N  Preparing Food and eating ? N -  Using the Toilet? N -  In the past six months, have you accidently leaked urine? N -  Do you have problems with loss of bowel control? N -  Managing your Medications? N -  Managing your Finances? N -  Housekeeping or managing your Housekeeping? N -    Fall/Depression Screening:    PHQ 2/9 Scores 04/05/2015  PHQ - 2 Score 0    Assessment:   Routine home visit for case closure. Patient with had recent routine visit with PCP.  Recent Pulmonary Embolism Patient denies chest pain or shortness of breath, tolerates increased activity, reports he goes to gym 3 days a week to walk on the treadmill. Patient continues to have swelling in left leg, he reports that this is chronic, also wears a brace to lower leg due previous nerve damage   Hypertension Patient continues to monitor blood pressure at least twice weekly, blood pressure stable. Patient taking medications as prescribed, denies concerns..   Patient goals met, no case management needs identified,  patient in agreement with case closure. Patient has [facility] contact information, if needed for future needs   Plan:  Case Closure Will notify Lurline Del, CMA of case closure, Will notify PCP of case closure.   Joylene Draft, RN, [place] Management [phone]- Mobile [phone]- Toll Free Main Office

## 2015-05-24 NOTE — Progress Notes (Signed)
HPI: FU CAD; history of PCI of his LAD in December 2006 with a bare-metal stent. The patient has had previous abdominal ultrasound on October 31, 2005, that showed no aneurysm. His most recent Myoview was performed in August 2013. At that time, he was found to have no scar or ischemia. His ejection fraction was 65%. Renal dopplers in August 2013 showed s/p L nephrectomy; normal R renal artery and exophytic R renal cyst. Admitted with dyspnea 12/16; echocardiogram revealed ejection fraction 45-50% with diffuse hypokinesis, grade 1 diastolic dysfunction. D-shaped interventricular ventricular septum suggestive of increased RV pressure and volume overload. Right ventricle was severely dilated. Right atrium was moderately dilated. He underwent VQ scan and lower extremity venous Dopplers. V/Q scan revealed intermediate probability for pulmonary embolism. These findings were likely the cause of his troponin elevation. Venous Dopplers was consistent with acute deep DVT in the right gastrocnemus, vein left femoral vein and left popliteal vein, left posterior tibial vein, left peroneal vein, and left gastrocnemus. He is being treated with apixaban. Since I last saw him, He denies dyspnea on exertion, orthopnea, PND, pedal edema, syncope or chest pain.  Current Outpatient Prescriptions  Medication Sig Dispense Refill  . acetaminophen (TYLENOL) 500 MG tablet Take 500 mg by mouth every 6 (six) hours as needed (pain).    Marland Kitchen apixaban (ELIQUIS) 5 MG TABS tablet Take 1 tablet (5 mg total) by mouth 2 (two) times daily. 60 tablet 11  . Coenzyme Q10-Fish Oil-Vit E (CO-Q 10 OMEGA-3 FISH OIL PO) Take 2 capsules by mouth daily.    . folic acid (FOLVITE) 1 MG tablet Take 1 mg by mouth daily.    . metoprolol succinate (TOPROL-XL) 25 MG 24 hr tablet TAKE 1 TABLET EVERY DAY 30 tablet 0  . Multiple Vitamin (MULTIVITAMIN) tablet Joint supplement  1 tab po qd    . rosuvastatin (CRESTOR) 10 MG tablet Take 1 tablet (10 mg total)  by mouth daily. 30 tablet 11   No current facility-administered medications for this visit.     Past Medical History  Diagnosis Date  . CAD (coronary artery disease)     a. 2006: s/p overlapping BMS-LAD 03/2005.otherwise had normal Cx and nonobstructive disease in RCA (30-40% multiple discrete lesions prox RCa, 50% mRCA). b. Neg nuc 2010.  Marland Kitchen HLD (hyperlipidemia)   . BPH (benign prostatic hypertrophy)     s/p TURP  . CKD (chronic kidney disease), stage III   . Cancer [facility])     a. left kidney - s/p left nephrectomy.  . Arthritis     Osteo  . Kidney stone   . Essential hypertension   . Acute pulmonary embolism ([place]) 03/15/2015    Past Surgical History  Procedure Laterality Date  . Appendectomy  1938  . Hernia repair  12/00  . Cataract extraction  1/01 and '04  . Transurethral resection of prostate  2004  . Left nephrectomy  1/05  . Left knee total arthroplasty  11/25/05  . Joint replacement      left hip; bilateral knee  . Cardiac catheterization  2006    2 sents in LAD  . Coronary angioplasty    . Lumbar laminectomy/decompression microdiscectomy  12/05/2011    Procedure: LUMBAR LAMINECTOMY/DECOMPRESSION MICRODISCECTOMY 1 LEVEL;  Surgeon: Elaina Hoops, MD;  Location: [place] [facility];  Service: Neurosurgery;  Laterality: Bilateral;  lumbar four - five  . Eye surgery      CATARACT  . Hip arthroplasty  2010  Left  . Knee arthroplasty  2015    Right  . Lumbar laminectomy/decompression microdiscectomy  04/03/2012    Procedure: LUMBAR LAMINECTOMY/DECOMPRESSION MICRODISCECTOMY 1 LEVEL;  Surgeon: Elaina Hoops, MD;  Location: [place] [facility];  Service: Neurosurgery;  Laterality: Left;  left lumbar four-five laminectony diskectomy    Social History   Social History  . Marital Status: Widowed    Spouse Name: N/A  . Number of Children: N/A  . Years of Education: N/A   Occupational History  . Not on file.   Social History Main Topics  . Smoking status: Former Smoker -- 25 years     Quit date: 11/07/1982  . Smokeless tobacco: Not on file     Comment: 1984  . Alcohol Use: 1.8 oz/week    1 Glasses of wine, 2 Cans of beer per week     Comment: 1-2 beers   . Drug Use: No  . Sexual Activity: Not on file   Other Topics Concern  . Not on file   Social History Narrative   Widowed (wife died 5 years ago); 2 children - son, ; daughter,  - both alive   Retired - [employer]     Family History  Problem Relation Age of Onset  . Heart disease      GP  . Heart attack      GP   . Heart attack Father     ROS: no fevers or chills, productive cough, hemoptysis, dysphasia, odynophagia, melena, hematochezia, dysuria, hematuria, rash, seizure activity, orthopnea, PND, pedal edema, claudication. Remaining systems are negative.  Physical Exam: Well-developed well-nourished in no acute distress.  Skin is warm and dry.  HEENT is normal.  Neck is supple.  Chest is clear to auscultation with normal expansion.  Cardiovascular exam is regular rate and rhythm.  Abdominal exam nontender or distended. No masses palpated. Extremities show trace edema on the right. neuro grossly intact  ECG Normal sinus rhythm at a rate of 72. No ST changes.

## 2015-06-01 ENCOUNTER — Encounter: Payer: Self-pay | Admitting: Cardiology

## 2015-06-01 ENCOUNTER — Ambulatory Visit (INDEPENDENT_AMBULATORY_CARE_PROVIDER_SITE_OTHER): Payer: PPO | Admitting: Cardiology

## 2015-06-01 VITALS — BP 110/60 | HR 72 | Ht 71.0 in | Wt 188.2 lb

## 2015-06-01 DIAGNOSIS — I10 Essential (primary) hypertension: Secondary | ICD-10-CM

## 2015-06-01 DIAGNOSIS — I2510 Atherosclerotic heart disease of native coronary artery without angina pectoris: Secondary | ICD-10-CM

## 2015-06-01 DIAGNOSIS — E785 Hyperlipidemia, unspecified: Secondary | ICD-10-CM | POA: Diagnosis not present

## 2015-06-01 NOTE — Assessment & Plan Note (Signed)
Blood pressure controlled. Continue present medications. 

## 2015-06-01 NOTE — Assessment & Plan Note (Signed)
Continue statin. 

## 2015-06-01 NOTE — Patient Instructions (Signed)
Your physician wants you to follow-up in: 6 MONTHS WITH DR CRENSHAW You will receive a reminder letter in the mail two months in advance. If you don't receive a letter, please call our office to schedule the follow-up appointment.   If you need a refill on your cardiac medications before your next appointment, please call your pharmacy.  

## 2015-06-01 NOTE — Assessment & Plan Note (Signed)
Patient had a pulmonary embolus in December.He is much improved symptomatically. Continue apixaban. The patient travels quite frequently. He also states he was ill with a viral syndrome in November and was on his back for 3 weeks. These may have been the preceding factors for his event. Given that he travels frequently I will likely continue anticoagulation long-term.

## 2015-06-01 NOTE — Assessment & Plan Note (Signed)
Continue statin.No aspirin given need for anticoagulation. 

## 2015-06-06 DIAGNOSIS — C44519 Basal cell carcinoma of skin of other part of trunk: Secondary | ICD-10-CM | POA: Diagnosis not present

## 2015-06-06 DIAGNOSIS — L570 Actinic keratosis: Secondary | ICD-10-CM | POA: Diagnosis not present

## 2015-06-06 DIAGNOSIS — L821 Other seborrheic keratosis: Secondary | ICD-10-CM | POA: Diagnosis not present

## 2015-06-06 DIAGNOSIS — L578 Other skin changes due to chronic exposure to nonionizing radiation: Secondary | ICD-10-CM | POA: Diagnosis not present

## 2015-08-23 DIAGNOSIS — I2699 Other pulmonary embolism without acute cor pulmonale: Secondary | ICD-10-CM | POA: Diagnosis not present

## 2015-08-23 DIAGNOSIS — Z905 Acquired absence of kidney: Secondary | ICD-10-CM | POA: Diagnosis not present

## 2015-08-23 DIAGNOSIS — E785 Hyperlipidemia, unspecified: Secondary | ICD-10-CM | POA: Diagnosis not present

## 2015-08-23 DIAGNOSIS — I2510 Atherosclerotic heart disease of native coronary artery without angina pectoris: Secondary | ICD-10-CM | POA: Diagnosis not present

## 2015-08-23 DIAGNOSIS — I10 Essential (primary) hypertension: Secondary | ICD-10-CM | POA: Diagnosis not present

## 2015-08-23 DIAGNOSIS — N400 Enlarged prostate without lower urinary tract symptoms: Secondary | ICD-10-CM | POA: Diagnosis not present

## 2015-08-23 DIAGNOSIS — N183 Chronic kidney disease, stage 3 (moderate): Secondary | ICD-10-CM | POA: Diagnosis not present

## 2015-08-23 DIAGNOSIS — Z79899 Other long term (current) drug therapy: Secondary | ICD-10-CM | POA: Diagnosis not present

## 2015-12-13 NOTE — Progress Notes (Signed)
HPI: FU CAD; history of PCI of his LAD in December 2006 with a bare-metal stent. The patient has had previous abdominal ultrasound on October 31, 2005, that showed no aneurysm. His most recent Myoview was performed in August 2013. At that time, he was found to have no scar or ischemia. His ejection fraction was 65%. Renal dopplers in August 2013 showed s/p L nephrectomy; normal R renal artery and exophytic R renal cyst. Had PE 12/16; echocardiogram revealed ejection fraction 45-50% with diffuse hypokinesis, grade 1 diastolic dysfunction. D-shaped interventricular ventricular septum suggestive of increased RV pressure and volume overload. Right ventricle was severely dilated. Right atrium was moderately dilated. He underwent VQ scan and lower extremity venous Dopplers. V/Q scan revealed intermediate probability for pulmonary embolism. These findings were likely the cause of his troponin elevation. Venous Dopplers was consistent with acute deep DVT in the right gastrocnemus, vein left femoral vein and left popliteal vein, left posterior tibial vein, left peroneal vein, and left gastrocnemus. He is being treated with apixaban. Since I last saw him, the patient denies any dyspnea on exertion, orthopnea, PND, pedal edema, palpitations, syncope or chest pain.   Current Outpatient Prescriptions  Medication Sig Dispense Refill  . acetaminophen (TYLENOL) 500 MG tablet Take 500 mg by mouth every 6 (six) hours as needed (pain).    Marland Kitchen apixaban (ELIQUIS) 5 MG TABS tablet Take 1 tablet (5 mg total) by mouth 2 (two) times daily. 60 tablet 11  . Coenzyme Q10-Fish Oil-Vit E (CO-Q 10 OMEGA-3 FISH OIL PO) Take 2 capsules by mouth daily.    . folic acid (FOLVITE) 1 MG tablet Take 1 mg by mouth daily.    . metoprolol succinate (TOPROL-XL) 25 MG 24 hr tablet TAKE 1 TABLET EVERY DAY 30 tablet 0  . Multiple Vitamin (MULTIVITAMIN) tablet Joint supplement  1 tab po qd    . rosuvastatin (CRESTOR) 10 MG tablet Take 1 tablet  (10 mg total) by mouth daily. 30 tablet 11   No current facility-administered medications for this visit.      Past Medical History:  Diagnosis Date  . Acute pulmonary embolism ([place]) 03/15/2015  . Arthritis    Osteo  . BPH (benign prostatic hypertrophy)    s/p TURP  . CAD (coronary artery disease)    a. 2006: s/p overlapping BMS-LAD 03/2005.otherwise had normal Cx and nonobstructive disease in RCA (30-40% multiple discrete lesions prox RCa, 50% mRCA). b. Neg nuc 2010.  Marland Kitchen Cancer [facility])    a. left kidney - s/p left nephrectomy.  . CKD (chronic kidney disease), stage III   . Essential hypertension   . HLD (hyperlipidemia)   . Kidney stone     Past Surgical History:  Procedure Laterality Date  . APPENDECTOMY  1938  . CARDIAC CATHETERIZATION  2006   2 sents in LAD  . CATARACT EXTRACTION  1/01 and '04  . CORONARY ANGIOPLASTY    . EYE SURGERY     CATARACT  . HERNIA REPAIR  12/00  . HIP ARTHROPLASTY  2010   Left  . JOINT REPLACEMENT     left hip; bilateral knee  . KNEE ARTHROPLASTY  2015   Right  . left knee total arthroplasty  11/25/05  . left nephrectomy  1/05  . LUMBAR LAMINECTOMY/DECOMPRESSION MICRODISCECTOMY  12/05/2011   Procedure: LUMBAR LAMINECTOMY/DECOMPRESSION MICRODISCECTOMY 1 LEVEL;  Surgeon: Elaina Hoops, MD;  Location: [place] [facility];  Service: Neurosurgery;  Laterality: Bilateral;  lumbar four - five  .  LUMBAR LAMINECTOMY/DECOMPRESSION MICRODISCECTOMY  04/03/2012   Procedure: LUMBAR LAMINECTOMY/DECOMPRESSION MICRODISCECTOMY 1 LEVEL;  Surgeon: Elaina Hoops, MD;  Location: [place] [facility];  Service: Neurosurgery;  Laterality: Left;  left lumbar four-five laminectony diskectomy  . TRANSURETHRAL RESECTION OF PROSTATE  2004    Social History   Social History  . Marital status: Widowed    Spouse name: N/A  . Number of children: N/A  . Years of education: N/A   Occupational History  . Not on file.   Social History Main Topics  . Smoking status: Former Smoker     Years: 25.00    Quit date: 11/07/1982  . Smokeless tobacco: Never Used     Comment: 1984  . Alcohol use 1.8 oz/week    1 Glasses of wine, 2 Cans of beer per week     Comment: 1-2 beers   . Drug use: No  . Sexual activity: Not on file   Other Topics Concern  . Not on file   Social History Narrative   Widowed (wife died 5 years ago); 2 children - son, ; daughter,  - both alive   Retired - [employer]     Family History  Problem Relation Age of Onset  . Heart attack Father   . Heart disease      GP  . Heart attack      GP     ROS: no fevers or chills, productive cough, hemoptysis, dysphasia, odynophagia, melena, hematochezia, dysuria, hematuria, rash, seizure activity, orthopnea, PND, pedal edema, claudication. Remaining systems are negative.  Physical Exam: Well-developed well-nourished in no acute distress.  Skin is warm and dry.  HEENT is normal.  Neck is supple.  Chest is clear to auscultation with normal expansion.  Cardiovascular exam is regular rate and rhythm.  Abdominal exam nontender or distended. No masses palpated. Extremities show no edema. neuro grossly intact  ECG-Sinus rhythm at a rate of 67. No ST changes.  A/P  1 Pulmonary embolus-Continue apixaban. Laboratories May 2017 showed creatinine 1.41. He will have his hemoglobin and renal function checked when he sees primary care back in one month by his report. Patient does travel frequently and has a left leg brace. It is likely worthwhile to continue his anticoagulation long-term.  2 coronary artery disease-continue statin. No aspirin given need for anticoagulation.  3 hypertension-blood pressure controlled. Continue present medications.  4 hyperlipidemia-continue statin.  Kirk Ruths, MD

## 2015-12-14 ENCOUNTER — Encounter: Payer: Self-pay | Admitting: Cardiology

## 2015-12-14 ENCOUNTER — Ambulatory Visit (INDEPENDENT_AMBULATORY_CARE_PROVIDER_SITE_OTHER): Payer: PPO | Admitting: Cardiology

## 2015-12-14 VITALS — BP 118/72 | HR 67 | Ht 71.0 in | Wt 187.0 lb

## 2015-12-14 DIAGNOSIS — I10 Essential (primary) hypertension: Secondary | ICD-10-CM

## 2015-12-14 DIAGNOSIS — I2699 Other pulmonary embolism without acute cor pulmonale: Secondary | ICD-10-CM

## 2015-12-14 DIAGNOSIS — I2510 Atherosclerotic heart disease of native coronary artery without angina pectoris: Secondary | ICD-10-CM

## 2015-12-14 DIAGNOSIS — E785 Hyperlipidemia, unspecified: Secondary | ICD-10-CM | POA: Diagnosis not present

## 2015-12-14 NOTE — Patient Instructions (Signed)
Medication Instructions:  NO CHANGES.   Follow-Up: Your physician wants you to follow-up in: [place]. You will receive a reminder letter in the mail two months in advance. If you don't receive a letter, please call our office to schedule the follow-up appointment.   If you need a refill on your cardiac medications before your next appointment, please call your pharmacy.

## 2016-01-09 ENCOUNTER — Other Ambulatory Visit: Payer: Self-pay | Admitting: Pharmacist Clinician (PhC)/ Clinical Pharmacy Specialist

## 2016-01-09 MED ORDER — APIXABAN 5 MG PO TABS: 5.0000 mg | ORAL_TABLET | Freq: Two times a day (BID) | ORAL | 1 refills | Status: DC

## 2016-01-24 ENCOUNTER — Encounter: Payer: Self-pay | Admitting: Cardiology

## 2016-01-24 DIAGNOSIS — Z23 Encounter for immunization: Secondary | ICD-10-CM | POA: Diagnosis not present

## 2016-01-24 DIAGNOSIS — Z86711 Personal history of pulmonary embolism: Secondary | ICD-10-CM | POA: Diagnosis not present

## 2016-01-24 DIAGNOSIS — N183 Chronic kidney disease, stage 3 (moderate): Secondary | ICD-10-CM | POA: Diagnosis not present

## 2016-01-24 DIAGNOSIS — Z7901 Long term (current) use of anticoagulants: Secondary | ICD-10-CM | POA: Diagnosis not present

## 2016-01-31 ENCOUNTER — Telehealth: Payer: Self-pay | Admitting: *Deleted

## 2016-01-31 NOTE — Telephone Encounter (Signed)
-----   Message from Lelon Perla, MD sent at 01/29/2016 11:46 AM EDT ----- Change apixaban to 2.5 mg BID Kirk Ruths

## 2016-01-31 NOTE — Telephone Encounter (Signed)
Left message for pt to call.

## 2016-02-01 MED ORDER — APIXABAN 5 MG PO TABS: 2.5000 mg | ORAL_TABLET | Freq: Two times a day (BID) | ORAL | Status: DC

## 2016-02-01 NOTE — Telephone Encounter (Signed)
02-01-16 pt returned call to [facility] regarding lab work-pls call [phone]

## 2016-02-01 NOTE — Telephone Encounter (Signed)
Spoke with pt, he voiced understanding to decrease eliquis to 2.5 mg twice daily.

## 2016-03-11 ENCOUNTER — Other Ambulatory Visit: Payer: Self-pay

## 2016-03-11 MED ORDER — ROSUVASTATIN CALCIUM 10 MG PO TABS: 10.0000 mg | ORAL_TABLET | Freq: Every day | ORAL | 10 refills | Status: DC

## 2016-03-21 ENCOUNTER — Telehealth: Payer: Self-pay | Admitting: Cardiology

## 2016-03-21 NOTE — Telephone Encounter (Signed)
03/21/2016 Rcvd records from [facility] for apt with Dr. Stanford Breed on 06/11/2016. Gave to [employer]. mwc

## 2016-04-12 ENCOUNTER — Other Ambulatory Visit: Payer: Self-pay

## 2016-04-12 MED ORDER — ROSUVASTATIN CALCIUM 10 MG PO TABS: 10.0000 mg | ORAL_TABLET | Freq: Every day | ORAL | 2 refills | Status: AC

## 2016-05-02 DIAGNOSIS — I2510 Atherosclerotic heart disease of native coronary artery without angina pectoris: Secondary | ICD-10-CM | POA: Diagnosis not present

## 2016-05-02 DIAGNOSIS — Z0000 Encounter for general adult medical examination without abnormal findings: Secondary | ICD-10-CM | POA: Diagnosis not present

## 2016-05-02 DIAGNOSIS — N183 Chronic kidney disease, stage 3 (moderate): Secondary | ICD-10-CM | POA: Diagnosis not present

## 2016-05-02 DIAGNOSIS — H6121 Impacted cerumen, right ear: Secondary | ICD-10-CM | POA: Diagnosis not present

## 2016-05-02 DIAGNOSIS — Z7901 Long term (current) use of anticoagulants: Secondary | ICD-10-CM | POA: Diagnosis not present

## 2016-05-02 DIAGNOSIS — E784 Other hyperlipidemia: Secondary | ICD-10-CM | POA: Diagnosis not present

## 2016-05-02 DIAGNOSIS — Z9181 History of falling: Secondary | ICD-10-CM | POA: Diagnosis not present

## 2016-05-02 DIAGNOSIS — I10 Essential (primary) hypertension: Secondary | ICD-10-CM | POA: Diagnosis not present

## 2016-05-02 DIAGNOSIS — Z79899 Other long term (current) drug therapy: Secondary | ICD-10-CM | POA: Diagnosis not present

## 2016-05-02 DIAGNOSIS — Z1389 Encounter for screening for other disorder: Secondary | ICD-10-CM | POA: Diagnosis not present

## 2016-05-27 DIAGNOSIS — H6123 Impacted cerumen, bilateral: Secondary | ICD-10-CM | POA: Diagnosis not present

## 2016-06-05 DIAGNOSIS — L570 Actinic keratosis: Secondary | ICD-10-CM | POA: Diagnosis not present

## 2016-06-05 DIAGNOSIS — L821 Other seborrheic keratosis: Secondary | ICD-10-CM | POA: Diagnosis not present

## 2016-06-05 DIAGNOSIS — L578 Other skin changes due to chronic exposure to nonionizing radiation: Secondary | ICD-10-CM | POA: Diagnosis not present

## 2016-06-06 NOTE — Progress Notes (Signed)
HPI: FU CAD; history of PCI of his LAD in December 2006 with a bare-metal stent. The patient has had previous abdominal ultrasound on October 31, 2005, that showed no aneurysm. His most recent Myoview was performed in August 2013. At that time, he was found to have no scar or ischemia. His ejection fraction was 65%. Renal dopplers in August 2013 showed s/p L nephrectomy; normal R renal artery and exophytic R renal cyst. Had PE 12/16; echocardiogram revealed ejection fraction 45-50% with diffuse hypokinesis, grade 1 diastolic dysfunction. D-shaped interventricular ventricular septum suggestive of increased RV pressure and volume overload. Right ventricle was severely dilated. Right atrium was moderately dilated. He underwent VQ scan and lower extremity venous Dopplers. V/Q scan revealed intermediate probability for pulmonary embolism. These findings were likely the cause of his troponin elevation. Venous Dopplers was consistent with acute deep DVT in the right gastrocnemus, vein left femoral vein and left popliteal vein, left posterior tibial vein, left peroneal vein, and left gastrocnemus. He is being treated with apixaban. Since I last saw him, the patient has dyspnea with more extreme activities but not with routine activities. It is relieved with rest. It is not associated with chest pain. There is no orthopnea, PND or pedal edema. There is no syncope or palpitations. There is no exertional chest pain.   Current Outpatient Prescriptions  Medication Sig Dispense Refill  . acetaminophen (TYLENOL) 500 MG tablet Take 500 mg by mouth every 6 (six) hours as needed (pain).    Marland Kitchen apixaban (ELIQUIS) 5 MG TABS tablet Take 1 tablet (5 mg total) by mouth 2 (two) times daily.    . Coenzyme Q10-Fish Oil-Vit E (CO-Q 10 OMEGA-3 FISH OIL PO) Take 2 capsules by mouth daily.    . fexofenadine (ALLEGRA) 30 MG tablet Take 30 mg by mouth 2 (two) times daily.    . folic acid (FOLVITE) 1 MG tablet Take 1 mg by mouth  daily.    . metoprolol succinate (TOPROL-XL) 25 MG 24 hr tablet TAKE 1 TABLET EVERY DAY 30 tablet 0  . Multiple Vitamin (MULTIVITAMIN) tablet Joint supplement  1 tab po qd    . rosuvastatin (CRESTOR) 10 MG tablet Take 1 tablet (10 mg total) by mouth daily. 90 tablet 2   No current facility-administered medications for this visit.      Past Medical History:  Diagnosis Date  . Acute pulmonary embolism ([place]) 03/15/2015  . Arthritis    Osteo  . BPH (benign prostatic hypertrophy)    s/p TURP  . CAD (coronary artery disease)    a. 2006: s/p overlapping BMS-LAD 03/2005.otherwise had normal Cx and nonobstructive disease in RCA (30-40% multiple discrete lesions prox RCa, 50% mRCA). b. Neg nuc 2010.  Marland Kitchen Cancer [facility])    a. left kidney - s/p left nephrectomy.  . CKD (chronic kidney disease), stage III   . Essential hypertension   . HLD (hyperlipidemia)   . Kidney stone     Past Surgical History:  Procedure Laterality Date  . APPENDECTOMY  1938  . CARDIAC CATHETERIZATION  2006   2 sents in LAD  . CATARACT EXTRACTION  1/01 and '04  . CORONARY ANGIOPLASTY    . EYE SURGERY     CATARACT  . HERNIA REPAIR  12/00  . HIP ARTHROPLASTY  2010   Left  . JOINT REPLACEMENT     left hip; bilateral knee  . KNEE ARTHROPLASTY  2015   Right  . left knee total arthroplasty  11/25/05  . left nephrectomy  1/05  . LUMBAR LAMINECTOMY/DECOMPRESSION MICRODISCECTOMY  12/05/2011   Procedure: LUMBAR LAMINECTOMY/DECOMPRESSION MICRODISCECTOMY 1 LEVEL;  Surgeon: Elaina Hoops, MD;  Location: [place];  Service: Neurosurgery;  Laterality: Bilateral;  lumbar four - five  . LUMBAR LAMINECTOMY/DECOMPRESSION MICRODISCECTOMY  04/03/2012   Procedure: LUMBAR LAMINECTOMY/DECOMPRESSION MICRODISCECTOMY 1 LEVEL;  Surgeon: Elaina Hoops, MD;  Location: [place] [facility];  Service: Neurosurgery;  Laterality: Left;  left lumbar four-five laminectony diskectomy  . TRANSURETHRAL RESECTION OF PROSTATE  2004    Social History   Social  History  . Marital status: Widowed    Spouse name: N/A  . Number of children: N/A  . Years of education: N/A   Occupational History  . Not on file.   Social History Main Topics  . Smoking status: Former Smoker    Years: 25.00    Quit date: 11/07/1982  . Smokeless tobacco: Never Used     Comment: 1984  . Alcohol use 1.8 oz/week    1 Glasses of wine, 2 Cans of beer per week     Comment: 1-2 beers   . Drug use: No  . Sexual activity: Not on file   Other Topics Concern  . Not on file   Social History Narrative   Widowed (wife died 5 years ago); 2 children - son, ; daughter,  - both alive   Retired - [employer]     Family History  Problem Relation Age of Onset  . Heart attack Father   . Heart disease      GP  . Heart attack      GP     ROS: no fevers or chills, productive cough, hemoptysis, dysphasia, odynophagia, melena, hematochezia, dysuria, hematuria, rash, seizure activity, orthopnea, PND, pedal edema, claudication. Remaining systems are negative.  Physical Exam: Well-developed well-nourished in no acute distress.  Skin is warm and dry.  HEENT is normal.  Neck is supple.  Chest is clear to auscultation with normal expansion.  Cardiovascular exam is regular rate and rhythm.  Abdominal exam nontender or distended. No masses palpated. Extremities show no edema. Left lower extremity in brace. neuro grossly intact  ECG-Sinus rhythm at a rate of 63. No ST changes personally reviewed  A/P  1 Coronary artery disease-continue statin. No aspirin given need for anticoagulation.  2 pulmonary embolus-given his history of frequent travel and left leg brace we have elected to anticoagulate long-term. Continue apixaban. He will need CBC, BUN and creatinine every 6 months.  3 hypertension-blood pressure controlled. Continue present medications.  4 hyperlipidemia-continue statin.  Kirk Ruths, MD

## 2016-06-11 ENCOUNTER — Ambulatory Visit (INDEPENDENT_AMBULATORY_CARE_PROVIDER_SITE_OTHER): Payer: PPO | Admitting: Cardiology

## 2016-06-11 ENCOUNTER — Encounter: Payer: Self-pay | Admitting: Cardiology

## 2016-06-11 VITALS — BP 120/70 | HR 63 | Ht 71.0 in | Wt 194.0 lb

## 2016-06-11 DIAGNOSIS — I2510 Atherosclerotic heart disease of native coronary artery without angina pectoris: Secondary | ICD-10-CM

## 2016-06-11 DIAGNOSIS — E7800 Pure hypercholesterolemia, unspecified: Secondary | ICD-10-CM | POA: Diagnosis not present

## 2016-06-11 DIAGNOSIS — I10 Essential (primary) hypertension: Secondary | ICD-10-CM

## 2016-06-11 NOTE — Patient Instructions (Signed)
Your physician wants you to follow-up in: ONE YEAR WITH DR CRENSHAW You will receive a reminder letter in the mail two months in advance. If you don't receive a letter, please call our office to schedule the follow-up appointment.   If you need a refill on your cardiac medications before your next appointment, please call your pharmacy.  

## 2016-11-04 ENCOUNTER — Other Ambulatory Visit: Payer: Self-pay | Admitting: Pharmacist Clinician (PhC)/ Clinical Pharmacy Specialist

## 2016-11-22 IMAGING — DX DG CHEST 2V
2 series · 2 of 2 positions shown · non-contrast
Comparison: 03/12/2015 and 12/02/2011

CLINICAL DATA: Shortness of Breath, upper respiratory infection

EXAM:
CHEST  2 VIEW

[chest pa]
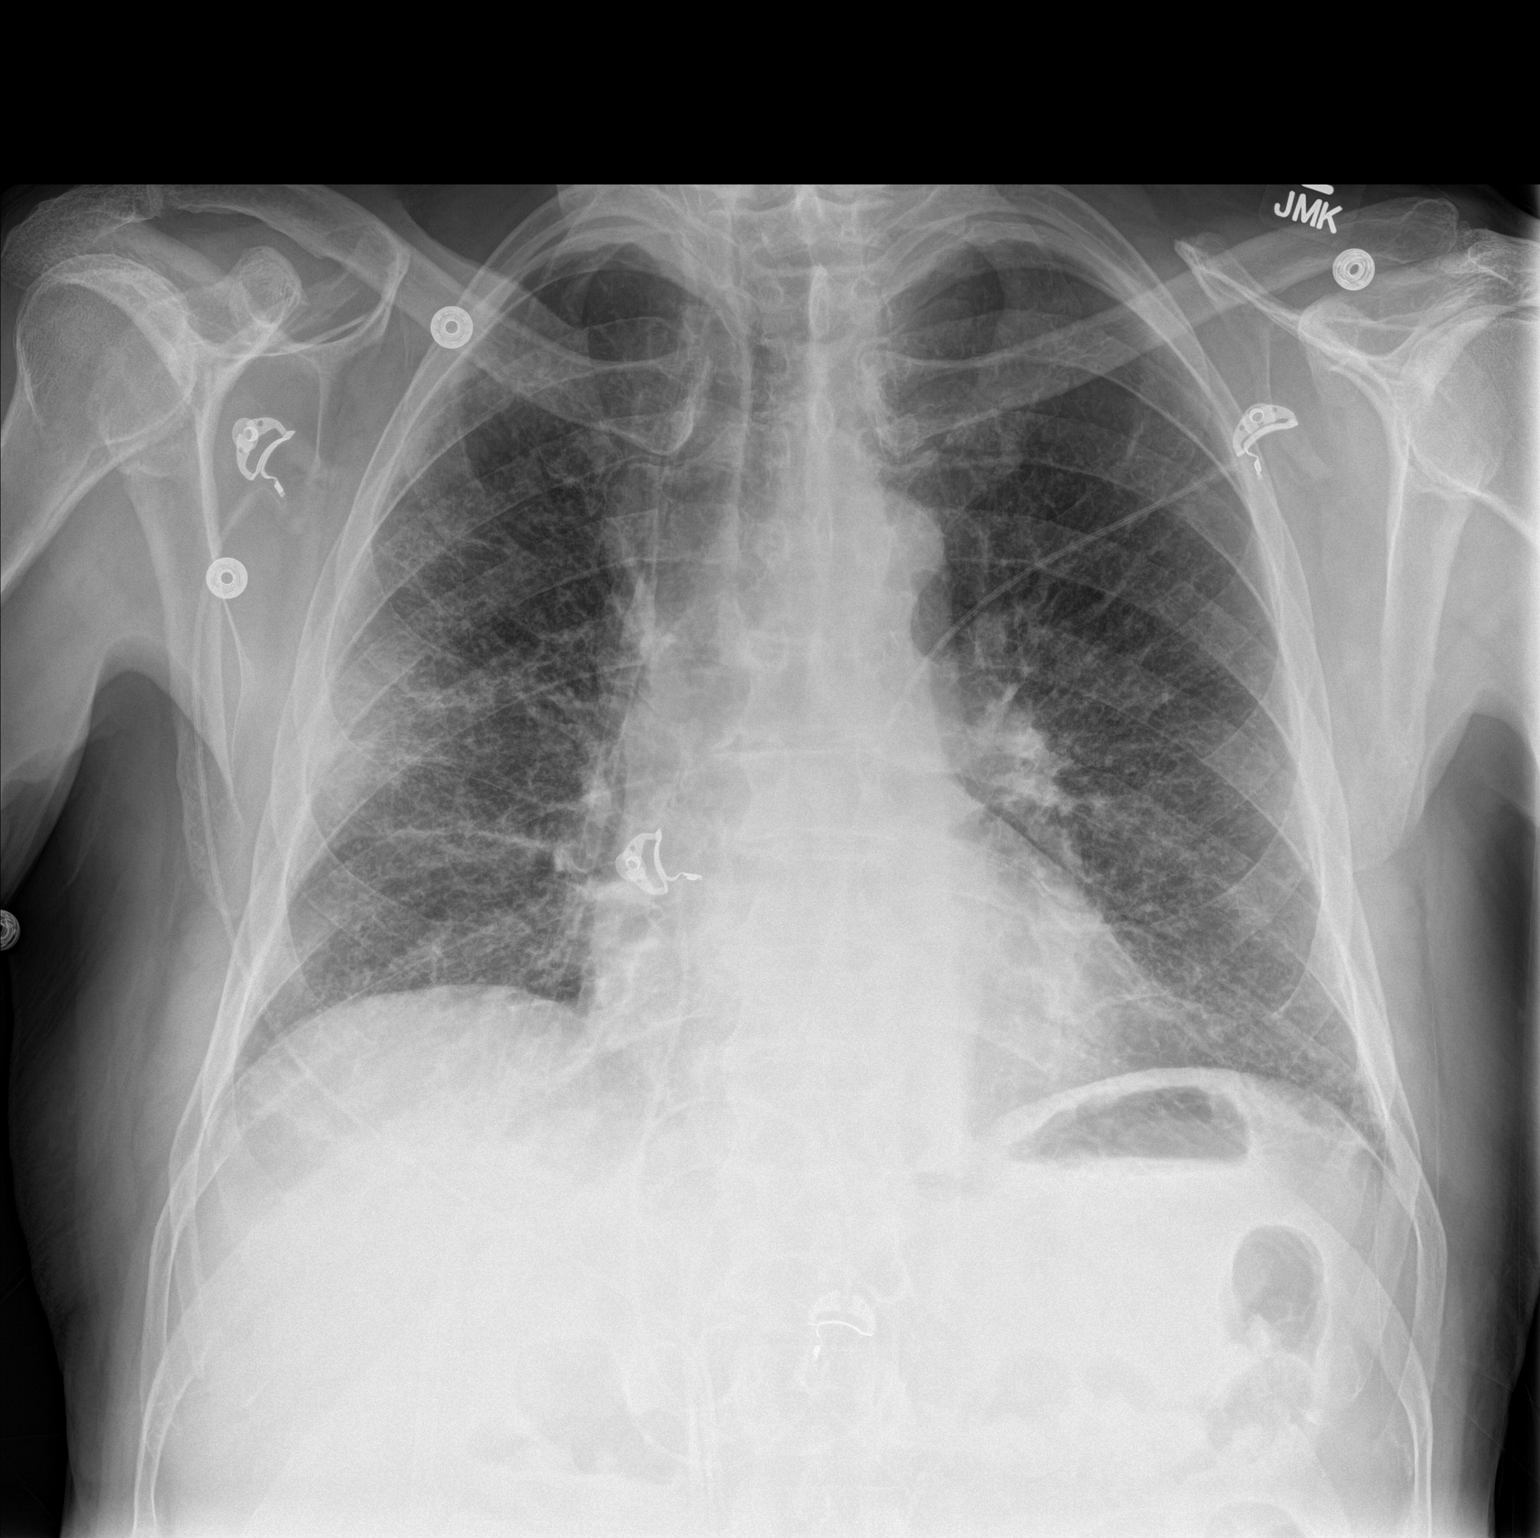

[chest lat]
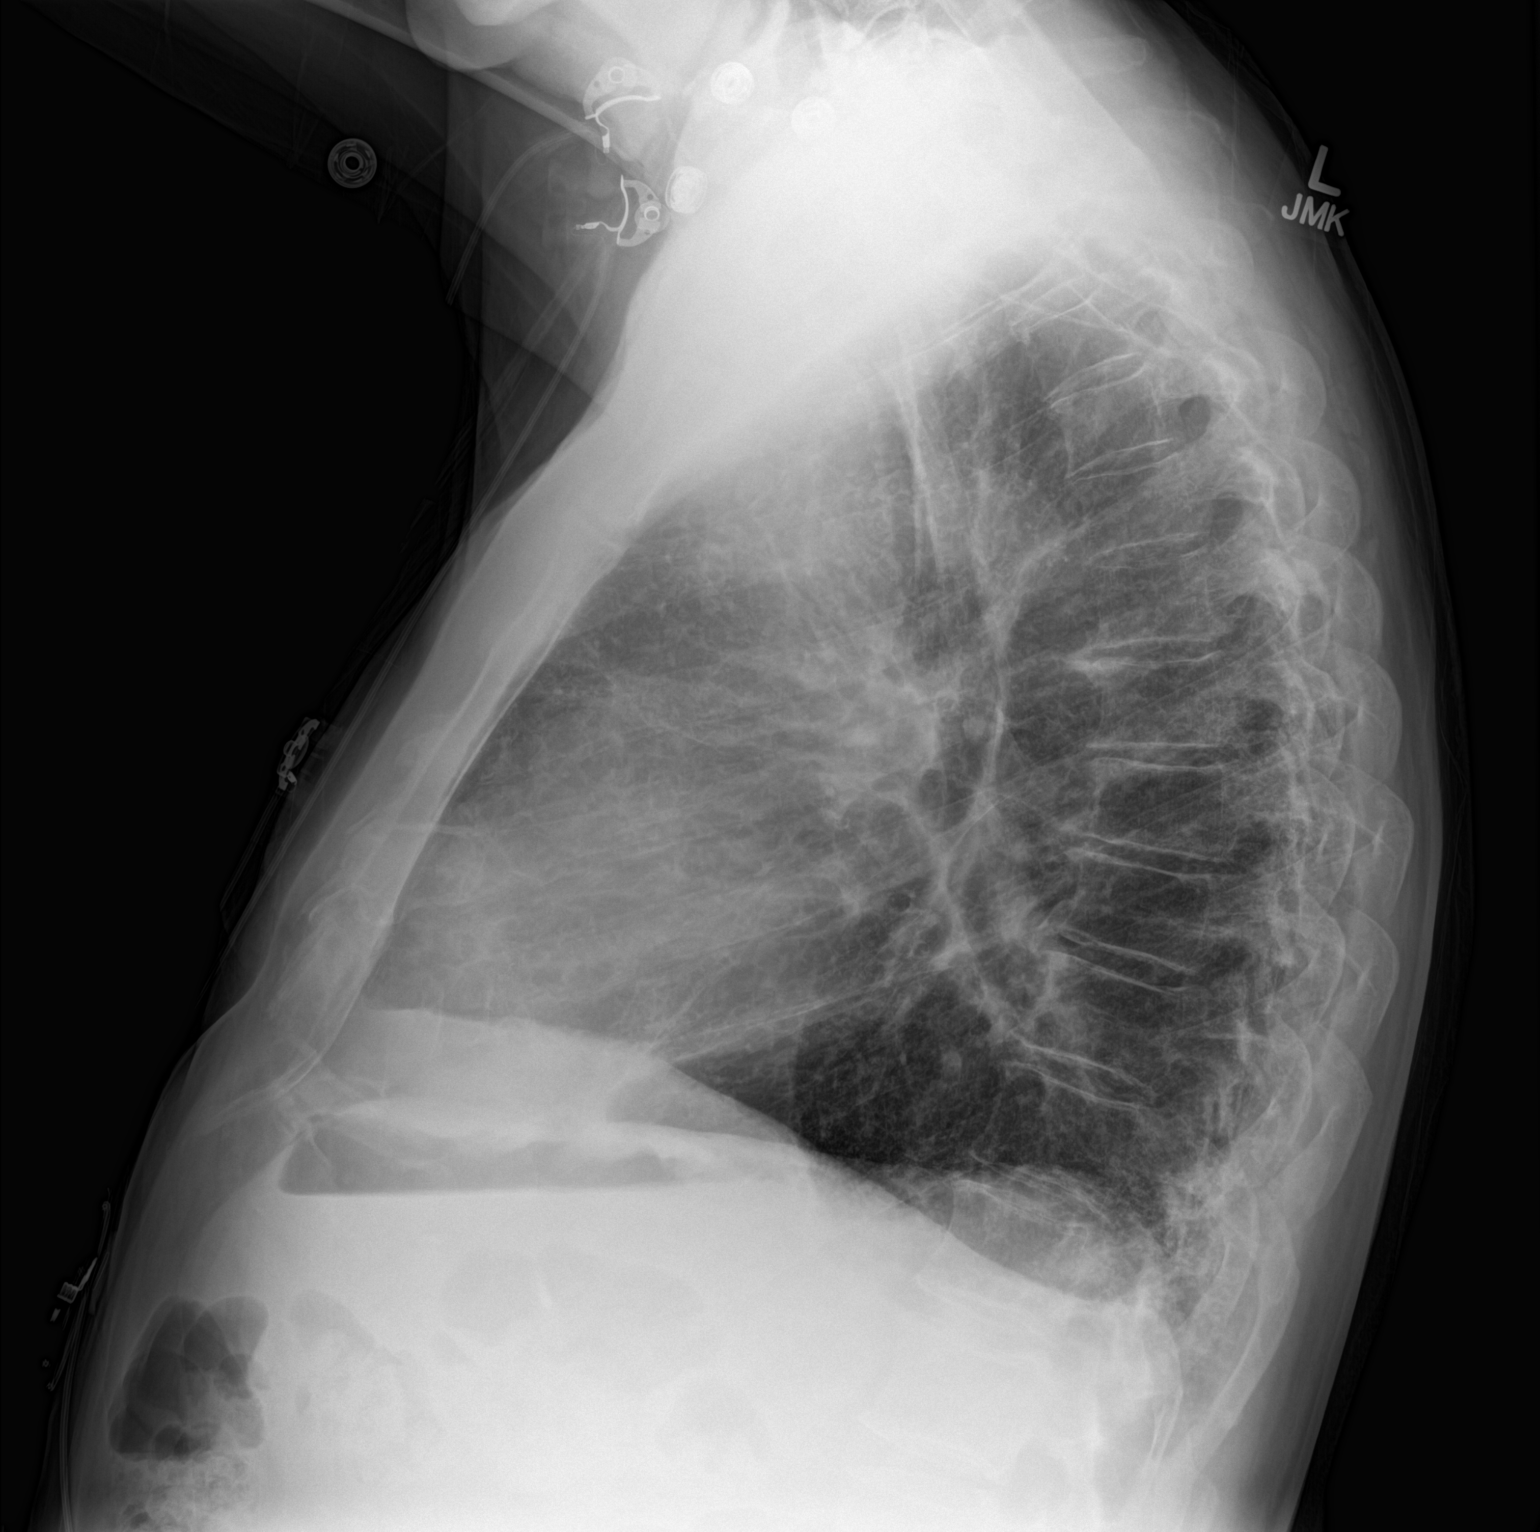

[2 of 2 positions shown; findings below may reference images not displayed]

FINDINGS: Cardiomediastinal silhouette is stable. Reticular mild interstitial
prominence bilaterally again noted in suspicious for mild
pneumonitis or less likely edema. No segmental infiltrate. Mild
degenerative changes thoracic spine.
IMPRESSION: Reticular mild interstitial prominence bilateral suspicious for mild
pneumonitis or less likely pulmonary edema. Mild degenerative
changes thoracic spine. No segmental infiltrate

## 2016-12-11 ENCOUNTER — Telehealth: Payer: Self-pay | Admitting: Cardiology

## 2016-12-11 DIAGNOSIS — I2699 Other pulmonary embolism without acute cor pulmonale: Secondary | ICD-10-CM

## 2016-12-11 NOTE — Telephone Encounter (Signed)
Spoke with pt, he is needing cbc and bmp in regards to his eliquis. Lab orders mailed to the pt for him to have drawn at PCP office.

## 2016-12-11 NOTE — Telephone Encounter (Signed)
New message  Pt call requesting to speak with RN about getting lab work ordered. Please call back to discuss

## 2017-01-01 DIAGNOSIS — I2699 Other pulmonary embolism without acute cor pulmonale: Secondary | ICD-10-CM | POA: Diagnosis not present

## 2017-01-24 DIAGNOSIS — Z23 Encounter for immunization: Secondary | ICD-10-CM | POA: Diagnosis not present

## 2017-05-05 DIAGNOSIS — Z9181 History of falling: Secondary | ICD-10-CM | POA: Diagnosis not present

## 2017-05-05 DIAGNOSIS — I2510 Atherosclerotic heart disease of native coronary artery without angina pectoris: Secondary | ICD-10-CM | POA: Diagnosis not present

## 2017-05-05 DIAGNOSIS — Z1331 Encounter for screening for depression: Secondary | ICD-10-CM | POA: Diagnosis not present

## 2017-05-05 DIAGNOSIS — Z0000 Encounter for general adult medical examination without abnormal findings: Secondary | ICD-10-CM | POA: Diagnosis not present

## 2017-05-05 DIAGNOSIS — Z86711 Personal history of pulmonary embolism: Secondary | ICD-10-CM | POA: Diagnosis not present

## 2017-05-05 DIAGNOSIS — Z1339 Encounter for screening examination for other mental health and behavioral disorders: Secondary | ICD-10-CM | POA: Diagnosis not present

## 2017-05-05 DIAGNOSIS — Z905 Acquired absence of kidney: Secondary | ICD-10-CM | POA: Diagnosis not present

## 2017-05-05 DIAGNOSIS — N183 Chronic kidney disease, stage 3 (moderate): Secondary | ICD-10-CM | POA: Diagnosis not present

## 2017-05-05 DIAGNOSIS — Z6827 Body mass index (BMI) 27.0-27.9, adult: Secondary | ICD-10-CM | POA: Diagnosis not present

## 2017-05-05 DIAGNOSIS — J0120 Acute ethmoidal sinusitis, unspecified: Secondary | ICD-10-CM | POA: Diagnosis not present

## 2017-05-05 DIAGNOSIS — I10 Essential (primary) hypertension: Secondary | ICD-10-CM | POA: Diagnosis not present

## 2017-05-05 DIAGNOSIS — E785 Hyperlipidemia, unspecified: Secondary | ICD-10-CM | POA: Diagnosis not present

## 2017-06-05 DIAGNOSIS — L570 Actinic keratosis: Secondary | ICD-10-CM | POA: Diagnosis not present

## 2017-06-05 DIAGNOSIS — L578 Other skin changes due to chronic exposure to nonionizing radiation: Secondary | ICD-10-CM | POA: Diagnosis not present

## 2017-06-05 DIAGNOSIS — L821 Other seborrheic keratosis: Secondary | ICD-10-CM | POA: Diagnosis not present

## 2017-06-05 DIAGNOSIS — D1801 Hemangioma of skin and subcutaneous tissue: Secondary | ICD-10-CM | POA: Diagnosis not present

## 2017-06-05 DIAGNOSIS — D225 Melanocytic nevi of trunk: Secondary | ICD-10-CM | POA: Diagnosis not present

## 2017-06-24 ENCOUNTER — Telehealth: Payer: Self-pay | Admitting: Cardiology

## 2017-06-24 DIAGNOSIS — E785 Hyperlipidemia, unspecified: Secondary | ICD-10-CM

## 2017-06-24 DIAGNOSIS — Z7901 Long term (current) use of anticoagulants: Secondary | ICD-10-CM

## 2017-06-24 NOTE — Telephone Encounter (Signed)
CBC, CMET, lipid ordered. Lab slips faxed to PCP office Message sent to patient in [place]

## 2017-06-24 NOTE — Telephone Encounter (Signed)
Patient calling,  States that he would like our office to fax an order for blood test to [facility] in [place]. The Fax number is [phone].

## 2017-06-27 DIAGNOSIS — E785 Hyperlipidemia, unspecified: Secondary | ICD-10-CM | POA: Diagnosis not present

## 2017-06-27 DIAGNOSIS — Z79899 Other long term (current) drug therapy: Secondary | ICD-10-CM | POA: Diagnosis not present

## 2017-07-01 NOTE — Progress Notes (Signed)
HPI: FU CAD; history of PCI of his LAD in December 2006 with a bare-metal stent. The patient has had previous abdominal ultrasound on October 31, 2005, that showed no aneurysm. His most recent Myoview was performed in August 2013. At that time, he was found to have no scar or ischemia. His ejection fraction was 65%. Renal dopplers in August 2013 showed s/p L nephrectomy; normal R renal artery and exophytic R renal cyst. Had PE 12/16; echocardiogram revealed ejection fraction 45-50% with diffuse hypokinesis, grade 1 diastolic dysfunction. D-shaped interventricular ventricular septum suggestive of increased RV pressure and volume overload. Right ventricle was severely dilated. Right atrium was moderately dilated. He underwent VQ scan and lower extremity venous Dopplers. V/Q scan revealed intermediate probability for pulmonary embolism. These findings were likely the cause of his troponin elevation. Venous Dopplers was consistent with acute deep DVT in the right gastrocnemus, vein left femoral vein and left popliteal vein, left posterior tibial vein, left peroneal vein, and left gastrocnemus. He is being treated with apixaban. Since I last saw him,    Current Outpatient Medications  Medication Sig Dispense Refill  . acetaminophen (TYLENOL) 500 MG tablet Take 500 mg by mouth every 6 (six) hours as needed (pain).    Marland Kitchen apixaban (ELIQUIS) 5 MG TABS tablet Take 1 tablet (5 mg total) by mouth 2 (two) times daily.    . Coenzyme Q10-Fish Oil-Vit E (CO-Q 10 OMEGA-3 FISH OIL PO) Take 2 capsules by mouth daily.    Marland Kitchen ELIQUIS 5 MG TABS tablet TAKE 1 TABLET (5 MG TOTAL) BY MOUTH 2 (TWO) TIMES DAILY. 180 tablet 1  . fexofenadine (ALLEGRA) 30 MG tablet Take 30 mg by mouth 2 (two) times daily.    . folic acid (FOLVITE) 1 MG tablet Take 1 mg by mouth daily.    . metoprolol succinate (TOPROL-XL) 25 MG 24 hr tablet TAKE 1 TABLET EVERY DAY 30 tablet 0  . Multiple Vitamin (MULTIVITAMIN) tablet Joint supplement  1 tab  po qd    . rosuvastatin (CRESTOR) 10 MG tablet Take 1 tablet (10 mg total) by mouth daily. 90 tablet 2   No current facility-administered medications for this visit.      Past Medical History:  Diagnosis Date  . Acute pulmonary embolism ([place]) 03/15/2015  . Arthritis    Osteo  . BPH (benign prostatic hypertrophy)    s/p TURP  . CAD (coronary artery disease)    a. 2006: s/p overlapping BMS-LAD 03/2005.otherwise had normal Cx and nonobstructive disease in RCA (30-40% multiple discrete lesions prox RCa, 50% mRCA). b. Neg nuc 2010.  Marland Kitchen Cancer [facility])    a. left kidney - s/p left nephrectomy.  . CKD (chronic kidney disease), stage III   . Essential hypertension   . HLD (hyperlipidemia)   . Kidney stone     Past Surgical History:  Procedure Laterality Date  . APPENDECTOMY  1938  . CARDIAC CATHETERIZATION  2006   2 sents in LAD  . CATARACT EXTRACTION  1/01 and '04  . CORONARY ANGIOPLASTY    . EYE SURGERY     CATARACT  . HERNIA REPAIR  12/00  . HIP ARTHROPLASTY  2010   Left  . JOINT REPLACEMENT     left hip; bilateral knee  . KNEE ARTHROPLASTY  2015   Right  . left knee total arthroplasty  11/25/05  . left nephrectomy  1/05  . LUMBAR LAMINECTOMY/DECOMPRESSION MICRODISCECTOMY  12/05/2011   Procedure: LUMBAR LAMINECTOMY/DECOMPRESSION MICRODISCECTOMY 1 LEVEL;  Surgeon: Elaina Hoops, MD;  Location: [place] [facility];  Service: Neurosurgery;  Laterality: Bilateral;  lumbar four - five  . LUMBAR LAMINECTOMY/DECOMPRESSION MICRODISCECTOMY  04/03/2012   Procedure: LUMBAR LAMINECTOMY/DECOMPRESSION MICRODISCECTOMY 1 LEVEL;  Surgeon: Elaina Hoops, MD;  Location: [place] [facility];  Service: Neurosurgery;  Laterality: Left;  left lumbar four-five laminectony diskectomy  . TRANSURETHRAL RESECTION OF PROSTATE  2004    Social History   Socioeconomic History  . Marital status: Widowed    Spouse name: Not on file  . Number of children: Not on file  . Years of education: Not on file  . Highest education  level: Not on file  Occupational History  . Not on file  Social Needs  . Financial resource strain: Not on file  . Food insecurity:    Worry: Not on file    Inability: Not on file  . Transportation needs:    Medical: Not on file    Non-medical: Not on file  Tobacco Use  . Smoking status: Former Smoker    Years: 25.00    Last attempt to quit: 11/07/1982    Years since quitting: 34.6  . Smokeless tobacco: Never Used  . Tobacco comment: 1984  Substance and Sexual Activity  . Alcohol use: Yes    Alcohol/week: 1.8 oz    Types: 1 Glasses of wine, 2 Cans of beer per week    Comment: 1-2 beers   . Drug use: No  . Sexual activity: Not on file  Lifestyle  . Physical activity:    Days per week: Not on file    Minutes per session: Not on file  . Stress: Not on file  Relationships  . Social connections:    Talks on phone: Not on file    Gets together: Not on file    Attends religious service: Not on file    Active member of club or organization: Not on file    Attends meetings of clubs or organizations: Not on file    Relationship status: Not on file  . Intimate partner violence:    Fear of current or ex partner: Not on file    Emotionally abused: Not on file    Physically abused: Not on file    Forced sexual activity: Not on file  Other Topics Concern  . Not on file  Social History Narrative   Widowed (wife died 5 years ago); 2 children - son, ; daughter,  - both alive   Retired - [employer]     Family History  Problem Relation Age of Onset  . Heart attack Father   . Heart disease Unknown        GP  . Heart attack Unknown        GP     ROS: no fevers or chills, productive cough, hemoptysis, dysphasia, odynophagia, melena, hematochezia, dysuria, hematuria, rash, seizure activity, orthopnea, PND, pedal edema, claudication. Remaining systems are negative.  Physical Exam: Well-developed well-nourished in no acute distress.  Skin is warm and dry.  HEENT  is normal.  Neck is supple.  Chest is clear to auscultation with normal expansion.  Cardiovascular exam is regular rate and rhythm.  Abdominal exam nontender or distended. No masses palpated. Extremities show no edema. neuro grossly intact  ECG-sinus rhythm at a rate of 60.  No ST changes.  Personally reviewed  A/P  1 coronary artery disease-patient doing well with no chest pain.  Plan to continue medical therapy.  Continue statin.  Patient is not on aspirin given need for anticoagulation.  2 hypertension-continue present medications.  Blood pressure is controlled.  3 hyperlipidemia-continue statin.  4 pulmonary embolus-patient is on long-term anticoagulation for this issue.  Continue apixaban.    Kirk Ruths, MD

## 2017-07-14 ENCOUNTER — Ambulatory Visit (INDEPENDENT_AMBULATORY_CARE_PROVIDER_SITE_OTHER): Payer: PPO | Admitting: Cardiology

## 2017-07-14 ENCOUNTER — Encounter: Payer: Self-pay | Admitting: Cardiology

## 2017-07-14 VITALS — BP 120/74 | HR 60 | Ht 71.0 in | Wt 196.2 lb

## 2017-07-14 DIAGNOSIS — I2510 Atherosclerotic heart disease of native coronary artery without angina pectoris: Secondary | ICD-10-CM

## 2017-07-14 NOTE — Patient Instructions (Signed)
Your physician wants you to follow-up in: ONE YEAR WITH DR CRENSHAW You will receive a reminder letter in the mail two months in advance. If you don't receive a letter, please call our office to schedule the follow-up appointment.   If you need a refill on your cardiac medications before your next appointment, please call your pharmacy.  

## 2017-10-04 ENCOUNTER — Other Ambulatory Visit: Payer: Self-pay | Admitting: Cardiology

## 2017-11-05 DIAGNOSIS — I2510 Atherosclerotic heart disease of native coronary artery without angina pectoris: Secondary | ICD-10-CM | POA: Diagnosis not present

## 2017-11-05 DIAGNOSIS — E785 Hyperlipidemia, unspecified: Secondary | ICD-10-CM | POA: Diagnosis not present

## 2017-11-05 DIAGNOSIS — N183 Chronic kidney disease, stage 3 (moderate): Secondary | ICD-10-CM | POA: Diagnosis not present

## 2017-12-04 DIAGNOSIS — H353211 Exudative age-related macular degeneration, right eye, with active choroidal neovascularization: Secondary | ICD-10-CM | POA: Diagnosis not present

## 2018-01-01 DIAGNOSIS — H353211 Exudative age-related macular degeneration, right eye, with active choroidal neovascularization: Secondary | ICD-10-CM | POA: Diagnosis not present

## 2018-01-29 DIAGNOSIS — H353211 Exudative age-related macular degeneration, right eye, with active choroidal neovascularization: Secondary | ICD-10-CM | POA: Diagnosis not present

## 2018-02-05 DIAGNOSIS — Z23 Encounter for immunization: Secondary | ICD-10-CM | POA: Diagnosis not present

## 2018-03-07 DIAGNOSIS — I2510 Atherosclerotic heart disease of native coronary artery without angina pectoris: Secondary | ICD-10-CM | POA: Diagnosis not present

## 2018-03-07 DIAGNOSIS — E785 Hyperlipidemia, unspecified: Secondary | ICD-10-CM | POA: Diagnosis not present

## 2018-03-07 DIAGNOSIS — N183 Chronic kidney disease, stage 3 (moderate): Secondary | ICD-10-CM | POA: Diagnosis not present

## 2018-03-12 DIAGNOSIS — H353211 Exudative age-related macular degeneration, right eye, with active choroidal neovascularization: Secondary | ICD-10-CM | POA: Diagnosis not present

## 2018-04-23 DIAGNOSIS — H353211 Exudative age-related macular degeneration, right eye, with active choroidal neovascularization: Secondary | ICD-10-CM | POA: Diagnosis not present

## 2018-05-07 DIAGNOSIS — Z79899 Other long term (current) drug therapy: Secondary | ICD-10-CM | POA: Diagnosis not present

## 2018-05-07 DIAGNOSIS — Z0000 Encounter for general adult medical examination without abnormal findings: Secondary | ICD-10-CM | POA: Diagnosis not present

## 2018-05-07 DIAGNOSIS — I2510 Atherosclerotic heart disease of native coronary artery without angina pectoris: Secondary | ICD-10-CM | POA: Diagnosis not present

## 2018-05-07 DIAGNOSIS — N183 Chronic kidney disease, stage 3 (moderate): Secondary | ICD-10-CM | POA: Diagnosis not present

## 2018-05-07 DIAGNOSIS — Z1331 Encounter for screening for depression: Secondary | ICD-10-CM | POA: Diagnosis not present

## 2018-05-07 DIAGNOSIS — Z86711 Personal history of pulmonary embolism: Secondary | ICD-10-CM | POA: Diagnosis not present

## 2018-05-07 DIAGNOSIS — E785 Hyperlipidemia, unspecified: Secondary | ICD-10-CM | POA: Diagnosis not present

## 2018-05-07 DIAGNOSIS — Z6827 Body mass index (BMI) 27.0-27.9, adult: Secondary | ICD-10-CM | POA: Diagnosis not present

## 2018-05-07 DIAGNOSIS — Z905 Acquired absence of kidney: Secondary | ICD-10-CM | POA: Diagnosis not present

## 2018-05-07 DIAGNOSIS — I10 Essential (primary) hypertension: Secondary | ICD-10-CM | POA: Diagnosis not present

## 2018-05-07 DIAGNOSIS — Z9181 History of falling: Secondary | ICD-10-CM | POA: Diagnosis not present

## 2018-05-21 DIAGNOSIS — H353211 Exudative age-related macular degeneration, right eye, with active choroidal neovascularization: Secondary | ICD-10-CM | POA: Diagnosis not present

## 2018-06-04 DIAGNOSIS — L821 Other seborrheic keratosis: Secondary | ICD-10-CM | POA: Diagnosis not present

## 2018-06-04 DIAGNOSIS — L578 Other skin changes due to chronic exposure to nonionizing radiation: Secondary | ICD-10-CM | POA: Diagnosis not present

## 2018-06-18 DIAGNOSIS — H353221 Exudative age-related macular degeneration, left eye, with active choroidal neovascularization: Secondary | ICD-10-CM | POA: Diagnosis not present

## 2018-06-18 DIAGNOSIS — H353211 Exudative age-related macular degeneration, right eye, with active choroidal neovascularization: Secondary | ICD-10-CM | POA: Diagnosis not present

## 2018-07-16 DIAGNOSIS — R002 Palpitations: Secondary | ICD-10-CM | POA: Diagnosis not present

## 2018-07-16 DIAGNOSIS — Z794 Long term (current) use of insulin: Secondary | ICD-10-CM | POA: Diagnosis not present

## 2018-07-16 DIAGNOSIS — Z880 Allergy status to penicillin: Secondary | ICD-10-CM | POA: Diagnosis not present

## 2018-07-16 DIAGNOSIS — K219 Gastro-esophageal reflux disease without esophagitis: Secondary | ICD-10-CM | POA: Diagnosis not present

## 2018-07-16 DIAGNOSIS — N183 Chronic kidney disease, stage 3 (moderate): Secondary | ICD-10-CM | POA: Diagnosis not present

## 2018-07-16 DIAGNOSIS — E1140 Type 2 diabetes mellitus with diabetic neuropathy, unspecified: Secondary | ICD-10-CM | POA: Diagnosis not present

## 2018-07-16 DIAGNOSIS — Z955 Presence of coronary angioplasty implant and graft: Secondary | ICD-10-CM | POA: Diagnosis not present

## 2018-07-16 DIAGNOSIS — I2510 Atherosclerotic heart disease of native coronary artery without angina pectoris: Secondary | ICD-10-CM | POA: Diagnosis not present

## 2018-07-16 DIAGNOSIS — B351 Tinea unguium: Secondary | ICD-10-CM | POA: Diagnosis not present

## 2018-07-16 DIAGNOSIS — Z79899 Other long term (current) drug therapy: Secondary | ICD-10-CM | POA: Diagnosis not present

## 2018-07-16 DIAGNOSIS — E1122 Type 2 diabetes mellitus with diabetic chronic kidney disease: Secondary | ICD-10-CM | POA: Diagnosis not present

## 2018-07-16 DIAGNOSIS — M79674 Pain in right toe(s): Secondary | ICD-10-CM | POA: Diagnosis not present

## 2018-07-16 DIAGNOSIS — M79675 Pain in left toe(s): Secondary | ICD-10-CM | POA: Diagnosis not present

## 2018-07-16 DIAGNOSIS — R0602 Shortness of breath: Secondary | ICD-10-CM | POA: Diagnosis not present

## 2018-07-16 DIAGNOSIS — I2583 Coronary atherosclerosis due to lipid rich plaque: Secondary | ICD-10-CM | POA: Diagnosis not present

## 2018-07-16 DIAGNOSIS — E782 Mixed hyperlipidemia: Secondary | ICD-10-CM | POA: Diagnosis not present

## 2018-07-16 DIAGNOSIS — Z7982 Long term (current) use of aspirin: Secondary | ICD-10-CM | POA: Diagnosis not present

## 2018-07-16 DIAGNOSIS — Z8249 Family history of ischemic heart disease and other diseases of the circulatory system: Secondary | ICD-10-CM | POA: Diagnosis not present

## 2018-07-16 DIAGNOSIS — I129 Hypertensive chronic kidney disease with stage 1 through stage 4 chronic kidney disease, or unspecified chronic kidney disease: Secondary | ICD-10-CM | POA: Diagnosis not present

## 2018-07-16 DIAGNOSIS — H353211 Exudative age-related macular degeneration, right eye, with active choroidal neovascularization: Secondary | ICD-10-CM | POA: Diagnosis not present

## 2018-07-16 DIAGNOSIS — Z90710 Acquired absence of both cervix and uterus: Secondary | ICD-10-CM | POA: Diagnosis not present

## 2018-07-16 DIAGNOSIS — I25119 Atherosclerotic heart disease of native coronary artery with unspecified angina pectoris: Secondary | ICD-10-CM | POA: Diagnosis not present

## 2018-07-16 DIAGNOSIS — E1142 Type 2 diabetes mellitus with diabetic polyneuropathy: Secondary | ICD-10-CM | POA: Diagnosis not present

## 2018-07-16 DIAGNOSIS — I340 Nonrheumatic mitral (valve) insufficiency: Secondary | ICD-10-CM | POA: Diagnosis not present

## 2018-07-16 DIAGNOSIS — Z833 Family history of diabetes mellitus: Secondary | ICD-10-CM | POA: Diagnosis not present

## 2018-07-23 ENCOUNTER — Other Ambulatory Visit: Payer: Self-pay | Admitting: Cardiology

## 2018-07-23 MED ORDER — APIXABAN 5 MG PO TABS: 5.0000 mg | ORAL_TABLET | Freq: Two times a day (BID) | ORAL | 1 refills | Status: DC

## 2018-07-23 NOTE — Telephone Encounter (Signed)
Eliquis refilled.  

## 2018-07-23 NOTE — Telephone Encounter (Signed)
°*  STAT* If patient is at the pharmacy, call can be transferred to refill team. ° ° °1. Which medications need to be refilled? (please list name of each medication and dose if known) ELIQUIS 5 MG TABS tablet ° °2. Which pharmacy/location (including street and city if local pharmacy) is medication to be sent to? [id number] - [place], [place] - [address] ° °3. Do they need a 30 day or 90 day supply? 90 ° °

## 2018-07-27 NOTE — Progress Notes (Signed)
Virtual Visit via Video Note changed to phone visit at patient's request.   This visit type was conducted due to national recommendations for restrictions regarding the COVID-19 Pandemic (e.g. social distancing) in an effort to limit this patient's exposure and mitigate transmission in our community.  Due to his co-morbid illnesses, this patient is at least at moderate risk for complications without adequate follow up.  This format is felt to be most appropriate for this patient at this time.  All issues noted in this document were discussed and addressed.  A limited physical exam was performed with this format.  Please refer to the patient's chart for his consent to telehealth for [facility].   Evaluation Performed:  Follow-up visit  Date:  08/03/2018   ID:  Ricardo Soto, DOB 12/05/1931, MRN [id number]  Patient Location: Home Provider Location: Home  PCP:  Mateo Flow, MD  Cardiologist:  Dr Stanford Breed  Chief Complaint:  FU CAD  History of Present Illness:    FU CAD; history of PCI of his LAD in December 2006 with a bare-metal stent. The patient has had previous abdominal ultrasound on October 31, 2005, that showed no aneurysm. His most recent Myoview was performed in August 2013. At that time, he was found to have no scar or ischemia. His ejection fraction was 65%. Renal dopplers in August 2013 showed s/p L nephrectomy; normal R renal artery and exophytic R renal cyst. Had PE 12/16; echo revealed ejection fraction 45-50% with diffuse hypokinesis, grade 1 diastolic dysfunction. D-shaped interventricular ventricular septum suggestive of increased RV pressure and volume overload. Right ventricle was severely dilated. Right atrium was moderately dilated. He underwent VQ scan and lower extremity venous Dopplers. V/Q scan revealed intermediate probability for pulmonary embolism. These findings were likely the cause of his troponin elevation. Venous Dopplers was consistent with acute deep  DVT in the right gastrocnemus, vein left femoral vein and left popliteal vein, left posterior tibial vein, left peroneal vein, and left gastrocnemus. He is being treated with apixaban. Since I last saw him, denies CP or dyspnea; no syncope or palpitations.  The patient does not have symptoms concerning for COVID-19 infection (fever, chills, cough, or new shortness of breath).    Past Medical History:  Diagnosis Date   Acute pulmonary embolism ([place]) 03/15/2015   Arthritis    Osteo   BPH (benign prostatic hypertrophy)    s/p TURP   CAD (coronary artery disease)    a. 2006: s/p overlapping BMS-LAD 03/2005.otherwise had normal Cx and nonobstructive disease in RCA (30-40% multiple discrete lesions prox RCa, 50% mRCA). b. Neg nuc 2010.   Cancer [facility])    a. left kidney - s/p left nephrectomy.   CKD (chronic kidney disease), stage III ([place])    Essential hypertension    HLD (hyperlipidemia)    Kidney stone    Past Surgical History:  Procedure Laterality Date   APPENDECTOMY  1938   CARDIAC CATHETERIZATION  2006   2 sents in LAD   CATARACT EXTRACTION  1/01 and '04   CORONARY ANGIOPLASTY     EYE SURGERY     CATARACT   HERNIA REPAIR  12/00   HIP ARTHROPLASTY  2010   Left   JOINT REPLACEMENT     left hip; bilateral knee   KNEE ARTHROPLASTY  2015   Right   left knee total arthroplasty  11/25/05   left nephrectomy  1/05   LUMBAR LAMINECTOMY/DECOMPRESSION MICRODISCECTOMY  12/05/2011   Procedure: LUMBAR LAMINECTOMY/DECOMPRESSION MICRODISCECTOMY  1 LEVEL;  Surgeon: Elaina Hoops, MD;  Location: [place] NEURO ORS;  Service: Neurosurgery;  Laterality: Bilateral;  lumbar four - five   LUMBAR LAMINECTOMY/DECOMPRESSION MICRODISCECTOMY  04/03/2012   Procedure: LUMBAR LAMINECTOMY/DECOMPRESSION MICRODISCECTOMY 1 LEVEL;  Surgeon: Elaina Hoops, MD;  Location: [place] NEURO ORS;  Service: Neurosurgery;  Laterality: Left;  left lumbar four-five laminectony diskectomy   TRANSURETHRAL RESECTION OF  PROSTATE  2004     Current Meds  Medication Sig   acetaminophen (TYLENOL) 500 MG tablet Take 500 mg by mouth every 6 (six) hours as needed (pain).   apixaban (ELIQUIS) 5 MG TABS tablet Take 1 tablet (5 mg total) by mouth 2 (two) times daily.   Cholecalciferol (VITAMIN D3) 50 MCG (2000 UT) TABS Take 2,000 Units by mouth daily.   Coenzyme Q10-Fish Oil-Vit E (CO-Q 10 OMEGA-3 FISH OIL PO) Take 2 capsules by mouth daily.   fexofenadine (ALLEGRA) 180 MG tablet Take 180 mg by mouth daily.   folic acid (FOLVITE) 1 MG tablet Take 1 mg by mouth daily.   metoprolol succinate (TOPROL-XL) 25 MG 24 hr tablet TAKE 1 TABLET EVERY DAY   Multiple Vitamin (MULTIVITAMIN) tablet Joint supplement  1 tab po qd   rosuvastatin (CRESTOR) 10 MG tablet Take 1 tablet (10 mg total) by mouth daily.     Allergies:   Patient has no known allergies.   Social History   Tobacco Use   Smoking status: Former Smoker    Years: 25.00    Last attempt to quit: 11/07/1982    Years since quitting: 35.7   Smokeless tobacco: Never Used   Tobacco comment: 1984  Substance Use Topics   Alcohol use: Yes    Alcohol/week: 3.0 standard drinks    Types: 1 Glasses of wine, 2 Cans of beer per week    Comment: 1-2 beers    Drug use: No     Family Hx: The patient's family history includes Heart attack in his father and unknown relative; Heart disease in his unknown relative.  ROS:   Please see the history of present illness.    No F/C, productive cough. Problems with balance. All other systems reviewed and are negative.   Recent Lipid Panel Lab Results  Component Value Date/Time   CHOL 132 03/13/2015 05:17 AM   TRIG 104 03/13/2015 05:17 AM   HDL 36 (L) 03/13/2015 05:17 AM   CHOLHDL 3.7 03/13/2015 05:17 AM   LDLCALC 75 03/13/2015 05:17 AM    Wt Readings from Last 3 Encounters:  08/03/18 190 lb (86.2 kg)  07/14/17 196 lb 3.2 oz (89 kg)  06/11/16 194 lb (88 kg)     Objective:    Vital Signs:  BP 130/73     Pulse 73    Ht 5' 11.5" (1.816 m)    Wt 190 lb (86.2 kg)    BMI 26.13 kg/m    VITAL SIGNS:  reviewed  Answers questions appropriately with normal affect.  ASSESSMENT & PLAN:    1. CAD-Pt denies CP; plan continue medical therapy with statin; no ASA given need for anticoagulation.  Labs from May 07, 2018 personally reviewed.  Hemoglobin 14.4 and creatinine 1.4. 2. Hypertension-continue present meds and follow. 3. Hyperlipidemia-continue statin.  Labs from May 07, 2018 personally reviewed.  LDL 68, SGOT and PT normal. 4. H/O pulmonary embolus-continue apixaban at present dose.  COVID-19 Education: The importance of social distancing was discussed today.  Time:   Today, I have spent 15 minutes with the patient  with telehealth technology discussing the above problems.     Medication Adjustments/Labs and Tests Ordered: Current medicines are reviewed at length with the patient today.  Concerns regarding medicines are outlined above.   Tests Ordered: No orders of the defined types were placed in this encounter.   Medication Changes: No orders of the defined types were placed in this encounter.   Disposition:  Follow up 6 months  Signed, Kirk Ruths, MD  08/03/2018 9:44 AM    [place]

## 2018-07-31 ENCOUNTER — Telehealth: Payer: Self-pay | Admitting: Cardiology

## 2018-07-31 NOTE — Telephone Encounter (Signed)
[id number]/ my chart/ consent/pre reg completed

## 2018-08-03 ENCOUNTER — Telehealth (INDEPENDENT_AMBULATORY_CARE_PROVIDER_SITE_OTHER): Payer: PPO | Admitting: Cardiology

## 2018-08-03 VITALS — BP 130/73 | HR 73 | Ht 71.5 in | Wt 190.0 lb

## 2018-08-03 DIAGNOSIS — I10 Essential (primary) hypertension: Secondary | ICD-10-CM

## 2018-08-03 DIAGNOSIS — I2510 Atherosclerotic heart disease of native coronary artery without angina pectoris: Secondary | ICD-10-CM

## 2018-08-03 DIAGNOSIS — E7800 Pure hypercholesterolemia, unspecified: Secondary | ICD-10-CM

## 2018-08-03 NOTE — Patient Instructions (Signed)

## 2018-08-29 DIAGNOSIS — H353211 Exudative age-related macular degeneration, right eye, with active choroidal neovascularization: Secondary | ICD-10-CM | POA: Diagnosis not present

## 2018-10-29 DIAGNOSIS — H353211 Exudative age-related macular degeneration, right eye, with active choroidal neovascularization: Secondary | ICD-10-CM | POA: Diagnosis not present

## 2018-12-17 DIAGNOSIS — H353211 Exudative age-related macular degeneration, right eye, with active choroidal neovascularization: Secondary | ICD-10-CM | POA: Diagnosis not present

## 2019-01-16 DIAGNOSIS — Z23 Encounter for immunization: Secondary | ICD-10-CM | POA: Diagnosis not present

## 2019-01-21 DIAGNOSIS — H353211 Exudative age-related macular degeneration, right eye, with active choroidal neovascularization: Secondary | ICD-10-CM | POA: Diagnosis not present

## 2019-02-01 ENCOUNTER — Encounter (INDEPENDENT_AMBULATORY_CARE_PROVIDER_SITE_OTHER): Payer: Self-pay

## 2019-02-05 DIAGNOSIS — I10 Essential (primary) hypertension: Secondary | ICD-10-CM | POA: Diagnosis not present

## 2019-02-05 DIAGNOSIS — E785 Hyperlipidemia, unspecified: Secondary | ICD-10-CM | POA: Diagnosis not present

## 2019-02-08 NOTE — Progress Notes (Signed)
HPI: FU CAD; history of PCI of his LAD in December 2006 with a bare-metal stent. The patient has had previous abdominal ultrasound on October 31, 2005, that showed no aneurysm. His most recent Myoview was performed in August 2013. At that time, he was found to have no scar or ischemia. His ejection fraction was 65%. Renal dopplers in August 2013 showed s/p L nephrectomy; normal R renal artery and exophytic R renal cyst. Had PE 12/16; echo revealed ejection fraction 45-50% with diffuse hypokinesis, grade 1 diastolic dysfunction. D-shaped interventricular ventricular septum suggestive of increased RV pressure and volume overload. Right ventricle was severely dilated. Right atrium was moderately dilated. He underwent VQ scan and lower extremity venous Dopplers. V/Q scan revealed intermediate probability for pulmonary embolism. These findings were likely the cause of his troponin elevation. Venous Dopplers was consistent with acute deep DVT in the right gastrocnemus, vein left femoral vein and left popliteal vein, left posterior tibial vein, left peroneal vein, and left gastrocnemus. He is being treated with apixaban. Since I last saw him, he denies dyspnea, chest pain, palpitations or syncope.  No bleeding.  Current Outpatient Medications  Medication Sig Dispense Refill  . acetaminophen (TYLENOL) 500 MG tablet Take 500 mg by mouth every 6 (six) hours as needed (pain).    Marland Kitchen apixaban (ELIQUIS) 5 MG TABS tablet Take 1 tablet (5 mg total) by mouth 2 (two) times daily. 180 tablet 1  . Cholecalciferol (VITAMIN D3) 50 MCG (2000 UT) TABS Take 2,000 Units by mouth daily.    . Coenzyme Q10-Fish Oil-Vit E (CO-Q 10 OMEGA-3 FISH OIL PO) Take 2 capsules by mouth daily.    . fexofenadine (ALLEGRA) 180 MG tablet Take 180 mg by mouth daily.    . folic acid (FOLVITE) 1 MG tablet Take 1 mg by mouth daily.    . metoprolol succinate (TOPROL-XL) 25 MG 24 hr tablet TAKE 1 TABLET EVERY DAY 30 tablet 0  . Multiple Vitamin  (MULTIVITAMIN) tablet Joint supplement  1 tab po qd    . rosuvastatin (CRESTOR) 10 MG tablet Take 1 tablet (10 mg total) by mouth daily. 90 tablet 2   No current facility-administered medications for this visit.      Past Medical History:  Diagnosis Date  . Acute pulmonary embolism ([place]) 03/15/2015  . Arthritis    Osteo  . BPH (benign prostatic hypertrophy)    s/p TURP  . CAD (coronary artery disease)    a. 2006: s/p overlapping BMS-LAD 03/2005.otherwise had normal Cx and nonobstructive disease in RCA (30-40% multiple discrete lesions prox RCa, 50% mRCA). b. Neg nuc 2010.  Marland Kitchen [facility])    a. left kidney - s/p left nephrectomy.  . CKD (chronic kidney disease), stage III   . Essential hypertension   . HLD (hyperlipidemia)   . Kidney stone     Past Surgical History:  Procedure Laterality Date  . APPENDECTOMY  1938  . CARDIAC CATHETERIZATION  2006   2 sents in LAD  . CATARACT EXTRACTION  1/01 and '04  . CORONARY ANGIOPLASTY    . EYE SURGERY     CATARACT  . HERNIA REPAIR  12/00  . HIP ARTHROPLASTY  2010   Left  . JOINT REPLACEMENT     left hip; bilateral knee  . KNEE ARTHROPLASTY  2015   Right  . left knee total arthroplasty  11/25/05  . left nephrectomy  1/05  . LUMBAR LAMINECTOMY/DECOMPRESSION MICRODISCECTOMY  12/05/2011   Procedure: LUMBAR LAMINECTOMY/DECOMPRESSION  MICRODISCECTOMY 1 LEVEL;  Surgeon: Elaina Hoops, MD;  Location: [place] NEURO ORS;  Service: Neurosurgery;  Laterality: Bilateral;  lumbar four - five  . LUMBAR LAMINECTOMY/DECOMPRESSION MICRODISCECTOMY  04/03/2012   Procedure: LUMBAR LAMINECTOMY/DECOMPRESSION MICRODISCECTOMY 1 LEVEL;  Surgeon: Elaina Hoops, MD;  Location: [place] NEURO ORS;  Service: Neurosurgery;  Laterality: Left;  left lumbar four-five laminectony diskectomy  . TRANSURETHRAL RESECTION OF PROSTATE  2004    Social History   Socioeconomic History  . Marital status: Widowed    Spouse name: Not on file  . Number of children: Not on file  . Years of  education: Not on file  . Highest education level: Not on file  Occupational History  . Not on file  Social Needs  . Financial resource strain: Not on file  . Food insecurity    Worry: Not on file    Inability: Not on file  . Transportation needs    Medical: Not on file    Non-medical: Not on file  Tobacco Use  . Smoking status: Former Smoker    Years: 25.00    Quit date: 11/07/1982    Years since quitting: 36.2  . Smokeless tobacco: Never Used  . Tobacco comment: 1984  Substance and Sexual Activity  . Alcohol use: Yes    Alcohol/week: 3.0 standard drinks    Types: 1 Glasses of wine, 2 Cans of beer per week    Comment: 1-2 beers   . Drug use: No  . Sexual activity: Not on file  Lifestyle  . Physical activity    Days per week: Not on file    Minutes per session: Not on file  . Stress: Not on file  Relationships  . Social Herbalist on phone: Not on file    Gets together: Not on file    Attends religious service: Not on file    Active member of club or organization: Not on file    Attends meetings of clubs or organizations: Not on file    Relationship status: Not on file  . Intimate partner violence    Fear of current or ex partner: Not on file    Emotionally abused: Not on file    Physically abused: Not on file    Forced sexual activity: Not on file  Other Topics Concern  . Not on file  Social History Narrative   Widowed (wife died 5 years ago); 2 children - son, ; daughter,  - both alive   Retired - [employer]     Family History  Problem Relation Age of Onset  . Heart attack Father   . Heart disease Unknown        GP  . Heart attack Unknown        GP     ROS: Left hip pain but no fevers or chills, productive cough, hemoptysis, dysphasia, odynophagia, melena, hematochezia, dysuria, hematuria, rash, seizure activity, orthopnea, PND, pedal edema, claudication. Remaining systems are negative.  Physical Exam: Well-developed  well-nourished in no acute distress.  Skin is warm and dry.  HEENT is normal.  Neck is supple.  Chest is clear to auscultation with normal expansion.  Cardiovascular exam is regular rate and rhythm.  Abdominal exam nontender or distended. No masses palpated. Extremities show no edema. neuro grossly intact  ECG-sinus rhythm at a rate of 65, no ST changes.  Personally reviewed  A/P  1 coronary artery disease patient doing well with no chest  pain.  Continue statin.  He is not on aspirin given need for anticoagulation.  2 history of pulmonary embolus-continue apixaban.  Check hemoglobin and renal function.  3 hypertension-blood pressure elevated; however he states typically controlled.  I have asked him to follow this and we will advance medications as needed.  4 hyperlipidemia-continue statin.  Check lipids and liver.  Kirk Ruths, MD

## 2019-02-10 ENCOUNTER — Ambulatory Visit: Payer: PPO | Admitting: Cardiology

## 2019-02-10 ENCOUNTER — Encounter: Payer: Self-pay | Admitting: Cardiology

## 2019-02-10 ENCOUNTER — Other Ambulatory Visit: Payer: Self-pay

## 2019-02-10 VITALS — BP 154/80 | HR 65 | Ht 71.0 in | Wt 188.0 lb

## 2019-02-10 DIAGNOSIS — E7800 Pure hypercholesterolemia, unspecified: Secondary | ICD-10-CM

## 2019-02-10 DIAGNOSIS — I10 Essential (primary) hypertension: Secondary | ICD-10-CM

## 2019-02-10 DIAGNOSIS — I2510 Atherosclerotic heart disease of native coronary artery without angina pectoris: Secondary | ICD-10-CM | POA: Diagnosis not present

## 2019-02-10 NOTE — Patient Instructions (Signed)
Medication Instructions:  NO CHANGE *If you need a refill on your cardiac medications before your next appointment, please call your pharmacy*  Lab Work: Your physician recommends that you return for lab work PRIOR TO EATING  If you have labs (blood work) drawn today and your tests are completely normal, you will receive your results only by: Marland Kitchen MyChart Message (if you have MyChart) OR . A paper copy in the mail If you have any lab test that is abnormal or we need to change your treatment, we will call you to review the results.  Follow-Up: At [facility], you and your health needs are our priority.  As part of our continuing mission to provide you with exceptional heart care, we have created designated Provider Care Teams.  These Care Teams include your primary Cardiologist (physician) and Advanced Practice Providers (APPs -  Physician Assistants and Nurse Practitioners) who all work together to provide you with the care you need, when you need it.  Your next appointment:   12 months  The format for your next appointment:   In Person  Provider:   Kirk Ruths, MD

## 2019-02-16 DIAGNOSIS — I2510 Atherosclerotic heart disease of native coronary artery without angina pectoris: Secondary | ICD-10-CM | POA: Diagnosis not present

## 2019-03-11 DIAGNOSIS — H353211 Exudative age-related macular degeneration, right eye, with active choroidal neovascularization: Secondary | ICD-10-CM | POA: Diagnosis not present

## 2019-05-08 DIAGNOSIS — I10 Essential (primary) hypertension: Secondary | ICD-10-CM | POA: Diagnosis not present

## 2019-05-08 DIAGNOSIS — N400 Enlarged prostate without lower urinary tract symptoms: Secondary | ICD-10-CM | POA: Diagnosis not present

## 2019-05-08 DIAGNOSIS — E785 Hyperlipidemia, unspecified: Secondary | ICD-10-CM | POA: Diagnosis not present

## 2019-05-10 DIAGNOSIS — Z0000 Encounter for general adult medical examination without abnormal findings: Secondary | ICD-10-CM | POA: Diagnosis not present

## 2019-05-10 DIAGNOSIS — E785 Hyperlipidemia, unspecified: Secondary | ICD-10-CM | POA: Diagnosis not present

## 2019-05-10 DIAGNOSIS — Z86711 Personal history of pulmonary embolism: Secondary | ICD-10-CM | POA: Diagnosis not present

## 2019-05-10 DIAGNOSIS — I2510 Atherosclerotic heart disease of native coronary artery without angina pectoris: Secondary | ICD-10-CM | POA: Diagnosis not present

## 2019-05-10 DIAGNOSIS — N183 Chronic kidney disease, stage 3 unspecified: Secondary | ICD-10-CM | POA: Diagnosis not present

## 2019-05-10 DIAGNOSIS — I10 Essential (primary) hypertension: Secondary | ICD-10-CM | POA: Diagnosis not present

## 2019-05-10 DIAGNOSIS — Z86010 Personal history of colonic polyps: Secondary | ICD-10-CM | POA: Diagnosis not present

## 2019-05-10 DIAGNOSIS — N400 Enlarged prostate without lower urinary tract symptoms: Secondary | ICD-10-CM | POA: Diagnosis not present

## 2019-05-10 DIAGNOSIS — Z79899 Other long term (current) drug therapy: Secondary | ICD-10-CM | POA: Diagnosis not present

## 2019-05-10 DIAGNOSIS — R202 Paresthesia of skin: Secondary | ICD-10-CM | POA: Diagnosis not present

## 2019-05-10 DIAGNOSIS — Z905 Acquired absence of kidney: Secondary | ICD-10-CM | POA: Diagnosis not present

## 2019-05-10 DIAGNOSIS — Z6825 Body mass index (BMI) 25.0-25.9, adult: Secondary | ICD-10-CM | POA: Diagnosis not present

## 2019-05-13 DIAGNOSIS — H353211 Exudative age-related macular degeneration, right eye, with active choroidal neovascularization: Secondary | ICD-10-CM | POA: Diagnosis not present

## 2019-06-07 DIAGNOSIS — L821 Other seborrheic keratosis: Secondary | ICD-10-CM | POA: Diagnosis not present

## 2019-06-07 DIAGNOSIS — L578 Other skin changes due to chronic exposure to nonionizing radiation: Secondary | ICD-10-CM | POA: Diagnosis not present

## 2019-07-01 DIAGNOSIS — H35373 Puckering of macula, bilateral: Secondary | ICD-10-CM | POA: Diagnosis not present

## 2019-07-01 DIAGNOSIS — H353211 Exudative age-related macular degeneration, right eye, with active choroidal neovascularization: Secondary | ICD-10-CM | POA: Diagnosis not present

## 2019-07-01 DIAGNOSIS — H353122 Nonexudative age-related macular degeneration, left eye, intermediate dry stage: Secondary | ICD-10-CM | POA: Diagnosis not present

## 2019-07-07 DIAGNOSIS — I2510 Atherosclerotic heart disease of native coronary artery without angina pectoris: Secondary | ICD-10-CM | POA: Diagnosis not present

## 2019-07-07 DIAGNOSIS — I10 Essential (primary) hypertension: Secondary | ICD-10-CM | POA: Diagnosis not present

## 2019-07-19 ENCOUNTER — Other Ambulatory Visit: Payer: Self-pay | Admitting: Cardiology

## 2019-07-19 NOTE — Telephone Encounter (Signed)
Called and lmomed the pt stating that we need labs to refill eliquis... will await call back and check daily to see if labs were completed

## 2019-07-20 NOTE — Telephone Encounter (Signed)
87 M 85.3 kg, Scr 1.3 (02/2019 - [facility])

## 2019-08-26 DIAGNOSIS — H353211 Exudative age-related macular degeneration, right eye, with active choroidal neovascularization: Secondary | ICD-10-CM | POA: Diagnosis not present

## 2019-08-27 DIAGNOSIS — E785 Hyperlipidemia, unspecified: Secondary | ICD-10-CM | POA: Diagnosis not present

## 2019-08-27 DIAGNOSIS — Z79899 Other long term (current) drug therapy: Secondary | ICD-10-CM | POA: Diagnosis not present

## 2019-08-27 DIAGNOSIS — E039 Hypothyroidism, unspecified: Secondary | ICD-10-CM | POA: Diagnosis not present

## 2019-10-28 DIAGNOSIS — H353211 Exudative age-related macular degeneration, right eye, with active choroidal neovascularization: Secondary | ICD-10-CM | POA: Diagnosis not present

## 2019-12-07 DIAGNOSIS — E039 Hypothyroidism, unspecified: Secondary | ICD-10-CM | POA: Diagnosis not present

## 2019-12-07 DIAGNOSIS — E785 Hyperlipidemia, unspecified: Secondary | ICD-10-CM | POA: Diagnosis not present

## 2019-12-07 DIAGNOSIS — I2510 Atherosclerotic heart disease of native coronary artery without angina pectoris: Secondary | ICD-10-CM | POA: Diagnosis not present

## 2019-12-27 DIAGNOSIS — E785 Hyperlipidemia, unspecified: Secondary | ICD-10-CM | POA: Diagnosis not present

## 2019-12-27 DIAGNOSIS — E039 Hypothyroidism, unspecified: Secondary | ICD-10-CM | POA: Diagnosis not present

## 2019-12-27 DIAGNOSIS — Z79899 Other long term (current) drug therapy: Secondary | ICD-10-CM | POA: Diagnosis not present

## 2020-01-06 DIAGNOSIS — H353211 Exudative age-related macular degeneration, right eye, with active choroidal neovascularization: Secondary | ICD-10-CM | POA: Diagnosis not present

## 2020-01-06 DIAGNOSIS — E7849 Other hyperlipidemia: Secondary | ICD-10-CM | POA: Diagnosis not present

## 2020-01-06 DIAGNOSIS — N183 Chronic kidney disease, stage 3 unspecified: Secondary | ICD-10-CM | POA: Diagnosis not present

## 2020-01-06 DIAGNOSIS — I129 Hypertensive chronic kidney disease with stage 1 through stage 4 chronic kidney disease, or unspecified chronic kidney disease: Secondary | ICD-10-CM | POA: Diagnosis not present

## 2020-02-10 NOTE — Progress Notes (Signed)
HPI: FU CAD; history of PCI of his LAD in December 2006 with a bare-metal stent. The patient has had previous abdominal ultrasound on October 31, 2005, that showed no aneurysm. His most recent Myoview was performed in August 2013. At that time, he was found to have no scar or ischemia. His ejection fraction was 65%. Renal dopplers in August 2013 showed s/p L nephrectomy; normal R renal artery and exophytic R renal cyst. Had PE 12/16; echo revealed ejection fraction 45-50% with diffuse hypokinesis, grade 1 diastolic dysfunction. D-shaped interventricular ventricular septum suggestive of increased RV pressure and volume overload. Right ventricle was severely dilated. Right atrium was moderately dilated. He underwent VQ scan and lower extremity venous Dopplers. V/Q scan revealed intermediate probability for pulmonary embolism. These findings were likely the cause of his troponin elevation. Venous Dopplers was consistent with acute deep DVT in the right gastrocnemus, vein left femoral vein and left popliteal vein, left posterior tibial vein, left peroneal vein, and left gastrocnemus. He is being treated with apixaban. Since I last saw him, patient denies dyspnea, chest pain, palpitations or syncope.  Current Outpatient Medications  Medication Sig Dispense Refill   acetaminophen (TYLENOL) 500 MG tablet Take 500 mg by mouth every 6 (six) hours as needed (pain).     Cholecalciferol (VITAMIN D3) 50 MCG (2000 UT) TABS Take 2,000 Units by mouth daily.     Coenzyme Q10-Fish Oil-Vit E (CO-Q 10 OMEGA-3 FISH OIL PO) Take 2 capsules by mouth daily.     ELIQUIS 5 MG TABS tablet TAKE 1 TABLET BY MOUTH TWICE A DAY 180 tablet 1   fexofenadine (ALLEGRA) 180 MG tablet Take 180 mg by mouth daily.     folic acid (FOLVITE) 1 MG tablet Take 1 mg by mouth daily.     metoprolol succinate (TOPROL-XL) 25 MG 24 hr tablet TAKE 1 TABLET EVERY DAY 30 tablet 0   Multiple Vitamin (MULTIVITAMIN) tablet Joint supplement  1  tab po qd     rosuvastatin (CRESTOR) 10 MG tablet Take 1 tablet (10 mg total) by mouth daily. 90 tablet 2   No current facility-administered medications for this visit.     Past Medical History:  Diagnosis Date   Acute pulmonary embolism ([place]) 03/15/2015   Arthritis    Osteo   BPH (benign prostatic hypertrophy)    s/p TURP   CAD (coronary artery disease)    a. 2006: s/p overlapping BMS-LAD 03/2005.otherwise had normal Cx and nonobstructive disease in RCA (30-40% multiple discrete lesions prox RCa, 50% mRCA). b. Neg nuc 2010.   Cancer [facility])    a. left kidney - s/p left nephrectomy.   CKD (chronic kidney disease), stage III ([place])    Essential hypertension    HLD (hyperlipidemia)    Kidney stone     Past Surgical History:  Procedure Laterality Date   APPENDECTOMY  1938   CARDIAC CATHETERIZATION  2006   2 sents in LAD   CATARACT EXTRACTION  1/01 and '04   CORONARY ANGIOPLASTY     EYE SURGERY     CATARACT   HERNIA REPAIR  12/00   HIP ARTHROPLASTY  2010   Left   JOINT REPLACEMENT     left hip; bilateral knee   KNEE ARTHROPLASTY  2015   Right   left knee total arthroplasty  11/25/05   left nephrectomy  1/05   LUMBAR LAMINECTOMY/DECOMPRESSION MICRODISCECTOMY  12/05/2011   Procedure: LUMBAR LAMINECTOMY/DECOMPRESSION MICRODISCECTOMY 1 LEVEL;  Surgeon: Elaina Hoops,  MD;  Location: [place] NEURO ORS;  Service: Neurosurgery;  Laterality: Bilateral;  lumbar four - five   LUMBAR LAMINECTOMY/DECOMPRESSION MICRODISCECTOMY  04/03/2012   Procedure: LUMBAR LAMINECTOMY/DECOMPRESSION MICRODISCECTOMY 1 LEVEL;  Surgeon: Elaina Hoops, MD;  Location: [place] NEURO ORS;  Service: Neurosurgery;  Laterality: Left;  left lumbar four-five laminectony diskectomy   TRANSURETHRAL RESECTION OF PROSTATE  2004    Social History   Socioeconomic History   Marital status: Widowed    Spouse name: Not on file   Number of children: Not on file   Years of education: Not on file   Highest  education level: Not on file  Occupational History   Not on file  Tobacco Use   Smoking status: Former Smoker    Years: 25.00    Quit date: 11/07/1982    Years since quitting: 37.3   Smokeless tobacco: Never Used   Tobacco comment: 1984  Substance and Sexual Activity   Alcohol use: Yes    Alcohol/week: 3.0 standard drinks    Types: 1 Glasses of wine, 2 Cans of beer per week    Comment: 1-2 beers    Drug use: No   Sexual activity: Not on file  Other Topics Concern   Not on file  Social History Narrative   Widowed (wife died 5 years ago); 2 children - son, ; daughter,  - both alive   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    Social Determinants of Health   Financial Resource Strain:    Difficulty of Paying Living Expenses: Not on file  Food Insecurity:    Worried About Charity fundraiser in the Last Year: Not on file   [employer] of Food in the Last Year: Not on file  Transportation Needs:    Film/video editor (Medical): Not on file   Lack of Transportation (Non-Medical): Not on file  Physical Activity:    Days of Exercise per Week: Not on file   Minutes of Exercise per Session: Not on file  Stress:    Feeling of Stress : Not on file  Social Connections:    Frequency of Communication with Friends and Family: Not on file   Frequency of Social Gatherings with Friends and Family: Not on file   Attends Religious Services: Not on file   Active Member of [employer] or Organizations: Not on file   Attends Archivist Meetings: Not on file   Marital Status: Not on file  Intimate Partner Violence:    Fear of Current or Ex-Partner: Not on file   Emotionally Abused: Not on file   Physically Abused: Not on file   Sexually Abused: Not on file    Family History  Problem Relation Age of Onset   Heart attack Father    Heart disease Other        GP   Heart attack Other        GP     ROS: no fevers or chills, productive cough, hemoptysis,  dysphasia, odynophagia, melena, hematochezia, dysuria, hematuria, rash, seizure activity, orthopnea, PND, pedal edema, claudication. Remaining systems are negative.  Physical Exam: Well-developed well-nourished in no acute distress.  Skin is warm and dry.  HEENT is normal.  Neck is supple.  Chest is clear to auscultation with normal expansion.  Cardiovascular exam is regular rate and rhythm.  Abdominal exam nontender or distended. No masses palpated. Extremities show no edema. neuro grossly intact  ECG-normal sinus rhythm at a rate of 80,  no ST changes.  Personally reviewed  A/P  1 coronary artery disease-patient denies chest pain.  Plan to continue medical therapy.  Continue statin.  He is not on aspirin given need for apixaban.  2 prior pulmonary embolus-continue apixaban.    3 hypertension-patient's blood pressure is controlled.  Continue present medications and follow.  4 hyperlipidemia-continue statin.    Kirk Ruths, MD

## 2020-02-18 ENCOUNTER — Ambulatory Visit: Payer: PPO | Admitting: Cardiology

## 2020-02-18 ENCOUNTER — Encounter: Payer: Self-pay | Admitting: Cardiology

## 2020-02-18 ENCOUNTER — Other Ambulatory Visit: Payer: Self-pay

## 2020-02-18 VITALS — BP 138/60 | HR 80 | Ht 71.0 in | Wt 185.0 lb

## 2020-02-18 DIAGNOSIS — I10 Essential (primary) hypertension: Secondary | ICD-10-CM | POA: Diagnosis not present

## 2020-02-18 DIAGNOSIS — E7800 Pure hypercholesterolemia, unspecified: Secondary | ICD-10-CM | POA: Diagnosis not present

## 2020-02-18 DIAGNOSIS — I2510 Atherosclerotic heart disease of native coronary artery without angina pectoris: Secondary | ICD-10-CM

## 2020-02-18 NOTE — Patient Instructions (Signed)

## 2020-04-13 DIAGNOSIS — H353211 Exudative age-related macular degeneration, right eye, with active choroidal neovascularization: Secondary | ICD-10-CM | POA: Diagnosis not present

## 2020-05-15 DIAGNOSIS — Z6827 Body mass index (BMI) 27.0-27.9, adult: Secondary | ICD-10-CM | POA: Diagnosis not present

## 2020-05-15 DIAGNOSIS — N183 Chronic kidney disease, stage 3 unspecified: Secondary | ICD-10-CM | POA: Diagnosis not present

## 2020-05-15 DIAGNOSIS — I2510 Atherosclerotic heart disease of native coronary artery without angina pectoris: Secondary | ICD-10-CM | POA: Diagnosis not present

## 2020-05-15 DIAGNOSIS — Z1331 Encounter for screening for depression: Secondary | ICD-10-CM | POA: Diagnosis not present

## 2020-05-15 DIAGNOSIS — E038 Other specified hypothyroidism: Secondary | ICD-10-CM | POA: Diagnosis not present

## 2020-05-15 DIAGNOSIS — R2681 Unsteadiness on feet: Secondary | ICD-10-CM | POA: Diagnosis not present

## 2020-05-15 DIAGNOSIS — E785 Hyperlipidemia, unspecified: Secondary | ICD-10-CM | POA: Diagnosis not present

## 2020-05-15 DIAGNOSIS — Z79899 Other long term (current) drug therapy: Secondary | ICD-10-CM | POA: Diagnosis not present

## 2020-05-15 DIAGNOSIS — Z0000 Encounter for general adult medical examination without abnormal findings: Secondary | ICD-10-CM | POA: Diagnosis not present

## 2020-05-15 DIAGNOSIS — E039 Hypothyroidism, unspecified: Secondary | ICD-10-CM | POA: Diagnosis not present

## 2020-05-24 DIAGNOSIS — M6281 Muscle weakness (generalized): Secondary | ICD-10-CM | POA: Diagnosis not present

## 2020-05-24 DIAGNOSIS — R2689 Other abnormalities of gait and mobility: Secondary | ICD-10-CM | POA: Diagnosis not present

## 2020-05-24 DIAGNOSIS — R2681 Unsteadiness on feet: Secondary | ICD-10-CM | POA: Diagnosis not present

## 2020-05-30 DIAGNOSIS — R2689 Other abnormalities of gait and mobility: Secondary | ICD-10-CM | POA: Diagnosis not present

## 2020-05-30 DIAGNOSIS — R2681 Unsteadiness on feet: Secondary | ICD-10-CM | POA: Diagnosis not present

## 2020-05-30 DIAGNOSIS — M6281 Muscle weakness (generalized): Secondary | ICD-10-CM | POA: Diagnosis not present

## 2020-06-01 DIAGNOSIS — R2681 Unsteadiness on feet: Secondary | ICD-10-CM | POA: Diagnosis not present

## 2020-06-01 DIAGNOSIS — M6281 Muscle weakness (generalized): Secondary | ICD-10-CM | POA: Diagnosis not present

## 2020-06-01 DIAGNOSIS — R2689 Other abnormalities of gait and mobility: Secondary | ICD-10-CM | POA: Diagnosis not present

## 2020-06-02 ENCOUNTER — Other Ambulatory Visit: Payer: Self-pay | Admitting: Cardiology

## 2020-06-05 DIAGNOSIS — M6281 Muscle weakness (generalized): Secondary | ICD-10-CM | POA: Diagnosis not present

## 2020-06-05 DIAGNOSIS — R2689 Other abnormalities of gait and mobility: Secondary | ICD-10-CM | POA: Diagnosis not present

## 2020-06-05 DIAGNOSIS — R2681 Unsteadiness on feet: Secondary | ICD-10-CM | POA: Diagnosis not present

## 2020-06-05 NOTE — Telephone Encounter (Signed)
Prescription refill request for Eliquis received. Indication:PE Last office visit:11/21 [place] Scr:1.2 5/21 Age: 87 Weight: 83.9 kg  Prescription refilled

## 2020-06-07 DIAGNOSIS — R2689 Other abnormalities of gait and mobility: Secondary | ICD-10-CM | POA: Diagnosis not present

## 2020-06-07 DIAGNOSIS — M6281 Muscle weakness (generalized): Secondary | ICD-10-CM | POA: Diagnosis not present

## 2020-06-07 DIAGNOSIS — R2681 Unsteadiness on feet: Secondary | ICD-10-CM | POA: Diagnosis not present

## 2020-06-12 DIAGNOSIS — R2689 Other abnormalities of gait and mobility: Secondary | ICD-10-CM | POA: Diagnosis not present

## 2020-06-12 DIAGNOSIS — M6281 Muscle weakness (generalized): Secondary | ICD-10-CM | POA: Diagnosis not present

## 2020-06-12 DIAGNOSIS — R2681 Unsteadiness on feet: Secondary | ICD-10-CM | POA: Diagnosis not present

## 2020-06-14 DIAGNOSIS — M6281 Muscle weakness (generalized): Secondary | ICD-10-CM | POA: Diagnosis not present

## 2020-06-14 DIAGNOSIS — R2681 Unsteadiness on feet: Secondary | ICD-10-CM | POA: Diagnosis not present

## 2020-06-14 DIAGNOSIS — R2689 Other abnormalities of gait and mobility: Secondary | ICD-10-CM | POA: Diagnosis not present

## 2020-06-20 DIAGNOSIS — R2681 Unsteadiness on feet: Secondary | ICD-10-CM | POA: Diagnosis not present

## 2020-06-20 DIAGNOSIS — R2689 Other abnormalities of gait and mobility: Secondary | ICD-10-CM | POA: Diagnosis not present

## 2020-06-20 DIAGNOSIS — M6281 Muscle weakness (generalized): Secondary | ICD-10-CM | POA: Diagnosis not present

## 2020-06-22 DIAGNOSIS — M6281 Muscle weakness (generalized): Secondary | ICD-10-CM | POA: Diagnosis not present

## 2020-06-22 DIAGNOSIS — R2681 Unsteadiness on feet: Secondary | ICD-10-CM | POA: Diagnosis not present

## 2020-06-22 DIAGNOSIS — R2689 Other abnormalities of gait and mobility: Secondary | ICD-10-CM | POA: Diagnosis not present

## 2020-06-27 DIAGNOSIS — M6281 Muscle weakness (generalized): Secondary | ICD-10-CM | POA: Diagnosis not present

## 2020-06-27 DIAGNOSIS — R2681 Unsteadiness on feet: Secondary | ICD-10-CM | POA: Diagnosis not present

## 2020-06-27 DIAGNOSIS — R2689 Other abnormalities of gait and mobility: Secondary | ICD-10-CM | POA: Diagnosis not present

## 2020-06-29 DIAGNOSIS — R2681 Unsteadiness on feet: Secondary | ICD-10-CM | POA: Diagnosis not present

## 2020-06-29 DIAGNOSIS — R2689 Other abnormalities of gait and mobility: Secondary | ICD-10-CM | POA: Diagnosis not present

## 2020-06-29 DIAGNOSIS — M6281 Muscle weakness (generalized): Secondary | ICD-10-CM | POA: Diagnosis not present

## 2020-07-04 DIAGNOSIS — M6281 Muscle weakness (generalized): Secondary | ICD-10-CM | POA: Diagnosis not present

## 2020-07-04 DIAGNOSIS — R2689 Other abnormalities of gait and mobility: Secondary | ICD-10-CM | POA: Diagnosis not present

## 2020-07-04 DIAGNOSIS — R2681 Unsteadiness on feet: Secondary | ICD-10-CM | POA: Diagnosis not present

## 2020-07-06 DIAGNOSIS — H353211 Exudative age-related macular degeneration, right eye, with active choroidal neovascularization: Secondary | ICD-10-CM | POA: Diagnosis not present

## 2020-07-06 DIAGNOSIS — H353122 Nonexudative age-related macular degeneration, left eye, intermediate dry stage: Secondary | ICD-10-CM | POA: Diagnosis not present

## 2020-07-07 DIAGNOSIS — R2689 Other abnormalities of gait and mobility: Secondary | ICD-10-CM | POA: Diagnosis not present

## 2020-07-07 DIAGNOSIS — M6281 Muscle weakness (generalized): Secondary | ICD-10-CM | POA: Diagnosis not present

## 2020-07-07 DIAGNOSIS — R2681 Unsteadiness on feet: Secondary | ICD-10-CM | POA: Diagnosis not present

## 2020-07-11 DIAGNOSIS — M6281 Muscle weakness (generalized): Secondary | ICD-10-CM | POA: Diagnosis not present

## 2020-07-11 DIAGNOSIS — R2681 Unsteadiness on feet: Secondary | ICD-10-CM | POA: Diagnosis not present

## 2020-07-11 DIAGNOSIS — R2689 Other abnormalities of gait and mobility: Secondary | ICD-10-CM | POA: Diagnosis not present

## 2020-07-14 DIAGNOSIS — M6281 Muscle weakness (generalized): Secondary | ICD-10-CM | POA: Diagnosis not present

## 2020-07-14 DIAGNOSIS — R2681 Unsteadiness on feet: Secondary | ICD-10-CM | POA: Diagnosis not present

## 2020-07-14 DIAGNOSIS — R2689 Other abnormalities of gait and mobility: Secondary | ICD-10-CM | POA: Diagnosis not present

## 2020-07-18 DIAGNOSIS — M6281 Muscle weakness (generalized): Secondary | ICD-10-CM | POA: Diagnosis not present

## 2020-07-18 DIAGNOSIS — R2681 Unsteadiness on feet: Secondary | ICD-10-CM | POA: Diagnosis not present

## 2020-07-18 DIAGNOSIS — R2689 Other abnormalities of gait and mobility: Secondary | ICD-10-CM | POA: Diagnosis not present

## 2020-07-20 DIAGNOSIS — R2689 Other abnormalities of gait and mobility: Secondary | ICD-10-CM | POA: Diagnosis not present

## 2020-07-20 DIAGNOSIS — R2681 Unsteadiness on feet: Secondary | ICD-10-CM | POA: Diagnosis not present

## 2020-07-20 DIAGNOSIS — M6281 Muscle weakness (generalized): Secondary | ICD-10-CM | POA: Diagnosis not present

## 2020-07-25 DIAGNOSIS — R2681 Unsteadiness on feet: Secondary | ICD-10-CM | POA: Diagnosis not present

## 2020-07-25 DIAGNOSIS — M6281 Muscle weakness (generalized): Secondary | ICD-10-CM | POA: Diagnosis not present

## 2020-07-25 DIAGNOSIS — R2689 Other abnormalities of gait and mobility: Secondary | ICD-10-CM | POA: Diagnosis not present

## 2020-07-27 DIAGNOSIS — M6281 Muscle weakness (generalized): Secondary | ICD-10-CM | POA: Diagnosis not present

## 2020-07-27 DIAGNOSIS — R2681 Unsteadiness on feet: Secondary | ICD-10-CM | POA: Diagnosis not present

## 2020-07-27 DIAGNOSIS — R2689 Other abnormalities of gait and mobility: Secondary | ICD-10-CM | POA: Diagnosis not present

## 2020-08-01 DIAGNOSIS — R2681 Unsteadiness on feet: Secondary | ICD-10-CM | POA: Diagnosis not present

## 2020-08-01 DIAGNOSIS — M6281 Muscle weakness (generalized): Secondary | ICD-10-CM | POA: Diagnosis not present

## 2020-08-01 DIAGNOSIS — R2689 Other abnormalities of gait and mobility: Secondary | ICD-10-CM | POA: Diagnosis not present

## 2020-08-04 DIAGNOSIS — M6281 Muscle weakness (generalized): Secondary | ICD-10-CM | POA: Diagnosis not present

## 2020-08-04 DIAGNOSIS — R2689 Other abnormalities of gait and mobility: Secondary | ICD-10-CM | POA: Diagnosis not present

## 2020-08-04 DIAGNOSIS — R2681 Unsteadiness on feet: Secondary | ICD-10-CM | POA: Diagnosis not present

## 2020-08-08 DIAGNOSIS — M6281 Muscle weakness (generalized): Secondary | ICD-10-CM | POA: Diagnosis not present

## 2020-08-08 DIAGNOSIS — R2681 Unsteadiness on feet: Secondary | ICD-10-CM | POA: Diagnosis not present

## 2020-08-08 DIAGNOSIS — R2689 Other abnormalities of gait and mobility: Secondary | ICD-10-CM | POA: Diagnosis not present

## 2020-08-11 DIAGNOSIS — R2689 Other abnormalities of gait and mobility: Secondary | ICD-10-CM | POA: Diagnosis not present

## 2020-08-11 DIAGNOSIS — R2681 Unsteadiness on feet: Secondary | ICD-10-CM | POA: Diagnosis not present

## 2020-08-11 DIAGNOSIS — M6281 Muscle weakness (generalized): Secondary | ICD-10-CM | POA: Diagnosis not present

## 2020-08-16 DIAGNOSIS — R2681 Unsteadiness on feet: Secondary | ICD-10-CM | POA: Diagnosis not present

## 2020-08-16 DIAGNOSIS — M6281 Muscle weakness (generalized): Secondary | ICD-10-CM | POA: Diagnosis not present

## 2020-08-16 DIAGNOSIS — R2689 Other abnormalities of gait and mobility: Secondary | ICD-10-CM | POA: Diagnosis not present

## 2020-08-23 DIAGNOSIS — R2681 Unsteadiness on feet: Secondary | ICD-10-CM | POA: Diagnosis not present

## 2020-08-23 DIAGNOSIS — R2689 Other abnormalities of gait and mobility: Secondary | ICD-10-CM | POA: Diagnosis not present

## 2020-08-23 DIAGNOSIS — M6281 Muscle weakness (generalized): Secondary | ICD-10-CM | POA: Diagnosis not present

## 2020-08-30 DIAGNOSIS — R2681 Unsteadiness on feet: Secondary | ICD-10-CM | POA: Diagnosis not present

## 2020-08-30 DIAGNOSIS — M6281 Muscle weakness (generalized): Secondary | ICD-10-CM | POA: Diagnosis not present

## 2020-08-30 DIAGNOSIS — R2689 Other abnormalities of gait and mobility: Secondary | ICD-10-CM | POA: Diagnosis not present

## 2020-09-04 DIAGNOSIS — E785 Hyperlipidemia, unspecified: Secondary | ICD-10-CM | POA: Diagnosis not present

## 2020-09-04 DIAGNOSIS — I10 Essential (primary) hypertension: Secondary | ICD-10-CM | POA: Diagnosis not present

## 2020-09-06 DIAGNOSIS — R2681 Unsteadiness on feet: Secondary | ICD-10-CM | POA: Diagnosis not present

## 2020-09-06 DIAGNOSIS — M6281 Muscle weakness (generalized): Secondary | ICD-10-CM | POA: Diagnosis not present

## 2020-09-06 DIAGNOSIS — R2689 Other abnormalities of gait and mobility: Secondary | ICD-10-CM | POA: Diagnosis not present

## 2020-09-13 DIAGNOSIS — R2689 Other abnormalities of gait and mobility: Secondary | ICD-10-CM | POA: Diagnosis not present

## 2020-09-13 DIAGNOSIS — M6281 Muscle weakness (generalized): Secondary | ICD-10-CM | POA: Diagnosis not present

## 2020-09-13 DIAGNOSIS — R2681 Unsteadiness on feet: Secondary | ICD-10-CM | POA: Diagnosis not present

## 2020-09-20 DIAGNOSIS — R2681 Unsteadiness on feet: Secondary | ICD-10-CM | POA: Diagnosis not present

## 2020-09-20 DIAGNOSIS — R2689 Other abnormalities of gait and mobility: Secondary | ICD-10-CM | POA: Diagnosis not present

## 2020-09-20 DIAGNOSIS — M6281 Muscle weakness (generalized): Secondary | ICD-10-CM | POA: Diagnosis not present

## 2020-09-21 DIAGNOSIS — H353211 Exudative age-related macular degeneration, right eye, with active choroidal neovascularization: Secondary | ICD-10-CM | POA: Diagnosis not present

## 2020-09-27 DIAGNOSIS — R2681 Unsteadiness on feet: Secondary | ICD-10-CM | POA: Diagnosis not present

## 2020-09-27 DIAGNOSIS — M6281 Muscle weakness (generalized): Secondary | ICD-10-CM | POA: Diagnosis not present

## 2020-09-27 DIAGNOSIS — R2689 Other abnormalities of gait and mobility: Secondary | ICD-10-CM | POA: Diagnosis not present

## 2020-10-04 DIAGNOSIS — R2681 Unsteadiness on feet: Secondary | ICD-10-CM | POA: Diagnosis not present

## 2020-10-04 DIAGNOSIS — M6281 Muscle weakness (generalized): Secondary | ICD-10-CM | POA: Diagnosis not present

## 2020-10-04 DIAGNOSIS — R2689 Other abnormalities of gait and mobility: Secondary | ICD-10-CM | POA: Diagnosis not present

## 2020-10-11 DIAGNOSIS — R2689 Other abnormalities of gait and mobility: Secondary | ICD-10-CM | POA: Diagnosis not present

## 2020-10-11 DIAGNOSIS — M6281 Muscle weakness (generalized): Secondary | ICD-10-CM | POA: Diagnosis not present

## 2020-10-11 DIAGNOSIS — R2681 Unsteadiness on feet: Secondary | ICD-10-CM | POA: Diagnosis not present

## 2020-10-18 DIAGNOSIS — R2681 Unsteadiness on feet: Secondary | ICD-10-CM | POA: Diagnosis not present

## 2020-10-18 DIAGNOSIS — R2689 Other abnormalities of gait and mobility: Secondary | ICD-10-CM | POA: Diagnosis not present

## 2020-10-18 DIAGNOSIS — M6281 Muscle weakness (generalized): Secondary | ICD-10-CM | POA: Diagnosis not present

## 2020-10-25 DIAGNOSIS — M6281 Muscle weakness (generalized): Secondary | ICD-10-CM | POA: Diagnosis not present

## 2020-10-25 DIAGNOSIS — R2689 Other abnormalities of gait and mobility: Secondary | ICD-10-CM | POA: Diagnosis not present

## 2020-10-25 DIAGNOSIS — R2681 Unsteadiness on feet: Secondary | ICD-10-CM | POA: Diagnosis not present

## 2020-11-01 DIAGNOSIS — M6281 Muscle weakness (generalized): Secondary | ICD-10-CM | POA: Diagnosis not present

## 2020-11-01 DIAGNOSIS — R2681 Unsteadiness on feet: Secondary | ICD-10-CM | POA: Diagnosis not present

## 2020-11-01 DIAGNOSIS — R2689 Other abnormalities of gait and mobility: Secondary | ICD-10-CM | POA: Diagnosis not present

## 2020-11-08 DIAGNOSIS — R2689 Other abnormalities of gait and mobility: Secondary | ICD-10-CM | POA: Diagnosis not present

## 2020-11-08 DIAGNOSIS — R2681 Unsteadiness on feet: Secondary | ICD-10-CM | POA: Diagnosis not present

## 2020-11-08 DIAGNOSIS — M6281 Muscle weakness (generalized): Secondary | ICD-10-CM | POA: Diagnosis not present

## 2020-12-06 DIAGNOSIS — E785 Hyperlipidemia, unspecified: Secondary | ICD-10-CM | POA: Diagnosis not present

## 2020-12-06 DIAGNOSIS — I10 Essential (primary) hypertension: Secondary | ICD-10-CM | POA: Diagnosis not present

## 2020-12-21 DIAGNOSIS — H353211 Exudative age-related macular degeneration, right eye, with active choroidal neovascularization: Secondary | ICD-10-CM | POA: Diagnosis not present

## 2021-01-30 DIAGNOSIS — Z23 Encounter for immunization: Secondary | ICD-10-CM | POA: Diagnosis not present

## 2021-02-05 DIAGNOSIS — I10 Essential (primary) hypertension: Secondary | ICD-10-CM | POA: Diagnosis not present

## 2021-02-05 DIAGNOSIS — E785 Hyperlipidemia, unspecified: Secondary | ICD-10-CM | POA: Diagnosis not present

## 2021-02-05 DIAGNOSIS — I2510 Atherosclerotic heart disease of native coronary artery without angina pectoris: Secondary | ICD-10-CM | POA: Diagnosis not present

## 2021-02-05 DIAGNOSIS — N183 Chronic kidney disease, stage 3 unspecified: Secondary | ICD-10-CM | POA: Diagnosis not present

## 2021-02-05 NOTE — Progress Notes (Signed)
HPI: FU CAD; history of PCI of his LAD in December 2006 with a bare-metal stent. The patient has had previous abdominal ultrasound on October 31, 2005, that showed no aneurysm. His most recent Myoview was performed in August 2013. At that time, he was found to have no scar or ischemia. His ejection fraction was 65%. Renal dopplers in August 2013 showed s/p L nephrectomy; normal R renal artery and exophytic R renal cyst. Had PE 12/16; echo revealed ejection fraction 45-50% with diffuse hypokinesis, grade 1 diastolic dysfunction. D-shaped interventricular ventricular septum suggestive of increased RV pressure and volume overload. Right ventricle was severely dilated. Right atrium was moderately dilated.  He underwent VQ scan and lower extremity venous Dopplers. V/Q scan revealed intermediate probability for pulmonary embolism.  These findings were likely the cause of his troponin elevation. Venous Dopplers was consistent with acute deep DVT in the right gastrocnemus, vein left femoral vein and left popliteal vein, left posterior tibial vein, left peroneal vein, and left gastrocnemus. He is being treated with apixaban. Since I last saw him, he denies dyspnea, chest pain, palpitations or syncope.  Minimal pedal edema.  No bleeding.  He has not fallen.  Current Outpatient Medications  Medication Sig Dispense Refill   acetaminophen (TYLENOL) 500 MG tablet Take 500 mg by mouth every 6 (six) hours as needed (pain).     Cholecalciferol (VITAMIN D3) 50 MCG (2000 UT) TABS Take 2,000 Units by mouth daily.     Coenzyme Q10-Fish Oil-Vit E (CO-Q 10 OMEGA-3 FISH OIL PO) Take 2 capsules by mouth daily.     ELIQUIS 5 MG TABS tablet TAKE 1 TABLET BY MOUTH TWICE A DAY 180 tablet 1   fexofenadine (ALLEGRA) 180 MG tablet Take 180 mg by mouth daily.     folic acid (FOLVITE) 1 MG tablet Take 1 mg by mouth daily.     metoprolol succinate (TOPROL-XL) 25 MG 24 hr tablet TAKE 1 TABLET EVERY DAY 30 tablet 0   Multiple Vitamin  (MULTIVITAMIN) tablet Joint supplement  1 tab po qd     rosuvastatin (CRESTOR) 10 MG tablet Take 1 tablet (10 mg total) by mouth daily. 90 tablet 2   No current facility-administered medications for this visit.     Past Medical History:  Diagnosis Date   Acute pulmonary embolism ([place]) 03/15/2015   Arthritis    Osteo   BPH (benign prostatic hypertrophy)    s/p TURP   CAD (coronary artery disease)    a. 2006: s/p overlapping BMS-LAD 03/2005.otherwise had normal Cx and nonobstructive disease in RCA (30-40% multiple discrete lesions prox RCa, 50% mRCA). b. Neg nuc 2010.   Cancer [facility])    a. left kidney - s/p left nephrectomy.   CKD (chronic kidney disease), stage III ([place])    Essential hypertension    HLD (hyperlipidemia)    Kidney stone     Past Surgical History:  Procedure Laterality Date   APPENDECTOMY  1938   CARDIAC CATHETERIZATION  2006   2 sents in LAD   CATARACT EXTRACTION  1/01 and '04   CORONARY ANGIOPLASTY     EYE SURGERY     CATARACT   HERNIA REPAIR  12/00   HIP ARTHROPLASTY  2010   Left   JOINT REPLACEMENT     left hip; bilateral knee   KNEE ARTHROPLASTY  2015   Right   left knee total arthroplasty  11/25/05   left nephrectomy  1/05   LUMBAR LAMINECTOMY/DECOMPRESSION MICRODISCECTOMY  12/05/2011   Procedure: LUMBAR LAMINECTOMY/DECOMPRESSION MICRODISCECTOMY 1 LEVEL;  Surgeon: Elaina Hoops, MD;  Location: [place] NEURO ORS;  Service: Neurosurgery;  Laterality: Bilateral;  lumbar four - five   LUMBAR LAMINECTOMY/DECOMPRESSION MICRODISCECTOMY  04/03/2012   Procedure: LUMBAR LAMINECTOMY/DECOMPRESSION MICRODISCECTOMY 1 LEVEL;  Surgeon: Elaina Hoops, MD;  Location: [place] NEURO ORS;  Service: Neurosurgery;  Laterality: Left;  left lumbar four-five laminectony diskectomy   TRANSURETHRAL RESECTION OF PROSTATE  2004    Social History   Socioeconomic History   Marital status: Widowed    Spouse name: Not on file   Number of children: Not on file   Years of education: Not on file    Highest education level: Not on file  Occupational History   Not on file  Tobacco Use   Smoking status: Former    Years: 25.00    Types: Cigarettes    Quit date: 11/07/1982    Years since quitting: 38.2   Smokeless tobacco: Never   Tobacco comments:    1984  Substance and Sexual Activity   Alcohol use: Yes    Alcohol/week: 3.0 standard drinks    Types: 1 Glasses of wine, 2 Cans of beer per week    Comment: 1-2 beers    Drug use: No   Sexual activity: Not on file  Other Topics Concern   Not on file  Social History Narrative   Widowed (wife died 5 years ago); 2 children - son, ; daughter,  - both alive   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    Social Determinants of Radio broadcast assistant Strain: Not on Art therapist Insecurity: Not on file  Transportation Needs: Not on file  Physical Activity: Not on file  Stress: Not on file  Social Connections: Not on file  Intimate Partner Violence: Not on file    Family History  Problem Relation Age of Onset   Heart attack Father    Heart disease Other        GP   Heart attack Other        GP     ROS: Peripheral neuropathy but no fevers or chills, productive cough, hemoptysis, dysphasia, odynophagia, melena, hematochezia, dysuria, hematuria, rash, seizure activity, orthopnea, PND, claudication. Remaining systems are negative.  Physical Exam: Well-developed well-nourished in no acute distress.  Skin is warm and dry.  HEENT is normal.  Neck is supple.  Chest is clear to auscultation with normal expansion.  Cardiovascular exam is regular rate and rhythm.  Abdominal exam nontender or distended. No masses palpated. Extremities show trace edema. neuro grossly intact  ECG-normal sinus rhythm with occasional PAC, no ST changes.  Personally reviewed  A/P  1 coronary artery disease-patient doing well with no symptoms.  Continue statin.  He is not on aspirin given need for apixaban.  2 hypertension-blood pressure  borderline; however he follows this at home and it is controlled.  We will continue to follow and adjust regimen as needed.  3 hyperlipidemia-continue statin.  We will have most recent lipids and liver forwarded to [place] from primary care.  4 history of pulmonary embolus-continue apixaban.  We will have most recent hemoglobin and renal function forwarded to [place] from primary care.  Kirk Ruths, MD

## 2021-02-13 ENCOUNTER — Ambulatory Visit: Payer: PPO | Admitting: Cardiology

## 2021-02-13 ENCOUNTER — Encounter: Payer: Self-pay | Admitting: Cardiology

## 2021-02-13 ENCOUNTER — Other Ambulatory Visit: Payer: Self-pay

## 2021-02-13 VITALS — BP 142/71 | HR 66 | Ht 71.0 in | Wt 192.0 lb

## 2021-02-13 DIAGNOSIS — E7800 Pure hypercholesterolemia, unspecified: Secondary | ICD-10-CM

## 2021-02-13 DIAGNOSIS — I2510 Atherosclerotic heart disease of native coronary artery without angina pectoris: Secondary | ICD-10-CM | POA: Diagnosis not present

## 2021-02-13 DIAGNOSIS — I10 Essential (primary) hypertension: Secondary | ICD-10-CM | POA: Diagnosis not present

## 2021-02-13 NOTE — Patient Instructions (Signed)

## 2021-03-07 DIAGNOSIS — E785 Hyperlipidemia, unspecified: Secondary | ICD-10-CM | POA: Diagnosis not present

## 2021-03-07 DIAGNOSIS — I10 Essential (primary) hypertension: Secondary | ICD-10-CM | POA: Diagnosis not present

## 2021-03-22 DIAGNOSIS — H353211 Exudative age-related macular degeneration, right eye, with active choroidal neovascularization: Secondary | ICD-10-CM | POA: Diagnosis not present

## 2021-03-22 DIAGNOSIS — H353122 Nonexudative age-related macular degeneration, left eye, intermediate dry stage: Secondary | ICD-10-CM | POA: Diagnosis not present

## 2021-04-19 ENCOUNTER — Other Ambulatory Visit: Payer: Self-pay | Admitting: Cardiology

## 2021-04-19 NOTE — Telephone Encounter (Signed)
Prescription refill request for Eliquis received. Indication:PE Last office visit:11/22 Scr:1.5 Age: 88 Weight:87.1 kg  Prescription refilled

## 2021-05-16 DIAGNOSIS — N183 Chronic kidney disease, stage 3 unspecified: Secondary | ICD-10-CM | POA: Diagnosis not present

## 2021-05-16 DIAGNOSIS — Z79899 Other long term (current) drug therapy: Secondary | ICD-10-CM | POA: Diagnosis not present

## 2021-05-16 DIAGNOSIS — Z86711 Personal history of pulmonary embolism: Secondary | ICD-10-CM | POA: Diagnosis not present

## 2021-05-16 DIAGNOSIS — Z6827 Body mass index (BMI) 27.0-27.9, adult: Secondary | ICD-10-CM | POA: Diagnosis not present

## 2021-05-16 DIAGNOSIS — Z0000 Encounter for general adult medical examination without abnormal findings: Secondary | ICD-10-CM | POA: Diagnosis not present

## 2021-05-16 DIAGNOSIS — I25118 Atherosclerotic heart disease of native coronary artery with other forms of angina pectoris: Secondary | ICD-10-CM | POA: Diagnosis not present

## 2021-05-16 DIAGNOSIS — I10 Essential (primary) hypertension: Secondary | ICD-10-CM | POA: Diagnosis not present

## 2021-05-16 DIAGNOSIS — E785 Hyperlipidemia, unspecified: Secondary | ICD-10-CM | POA: Diagnosis not present

## 2021-05-16 DIAGNOSIS — R7989 Other specified abnormal findings of blood chemistry: Secondary | ICD-10-CM | POA: Diagnosis not present

## 2021-06-05 DIAGNOSIS — E785 Hyperlipidemia, unspecified: Secondary | ICD-10-CM | POA: Diagnosis not present

## 2021-06-05 DIAGNOSIS — I10 Essential (primary) hypertension: Secondary | ICD-10-CM | POA: Diagnosis not present

## 2021-06-14 DIAGNOSIS — H353211 Exudative age-related macular degeneration, right eye, with active choroidal neovascularization: Secondary | ICD-10-CM | POA: Diagnosis not present

## 2021-06-15 DIAGNOSIS — Z96642 Presence of left artificial hip joint: Secondary | ICD-10-CM | POA: Diagnosis not present

## 2021-06-15 DIAGNOSIS — Z6827 Body mass index (BMI) 27.0-27.9, adult: Secondary | ICD-10-CM | POA: Diagnosis not present

## 2021-06-15 DIAGNOSIS — T8484XA Pain due to internal orthopedic prosthetic devices, implants and grafts, initial encounter: Secondary | ICD-10-CM | POA: Diagnosis not present

## 2021-07-05 DIAGNOSIS — M438X6 Other specified deforming dorsopathies, lumbar region: Secondary | ICD-10-CM | POA: Diagnosis not present

## 2021-07-05 DIAGNOSIS — M5136 Other intervertebral disc degeneration, lumbar region: Secondary | ICD-10-CM | POA: Diagnosis not present

## 2021-07-05 DIAGNOSIS — M6158 Other ossification of muscle, other site: Secondary | ICD-10-CM | POA: Diagnosis not present

## 2021-07-05 DIAGNOSIS — M48061 Spinal stenosis, lumbar region without neurogenic claudication: Secondary | ICD-10-CM | POA: Diagnosis not present

## 2021-07-05 DIAGNOSIS — Z96642 Presence of left artificial hip joint: Secondary | ICD-10-CM | POA: Diagnosis not present

## 2021-07-05 DIAGNOSIS — M4727 Other spondylosis with radiculopathy, lumbosacral region: Secondary | ICD-10-CM | POA: Diagnosis not present

## 2021-07-05 DIAGNOSIS — M47816 Spondylosis without myelopathy or radiculopathy, lumbar region: Secondary | ICD-10-CM | POA: Diagnosis not present

## 2021-07-05 DIAGNOSIS — M4726 Other spondylosis with radiculopathy, lumbar region: Secondary | ICD-10-CM | POA: Diagnosis not present

## 2021-07-05 DIAGNOSIS — Z96643 Presence of artificial hip joint, bilateral: Secondary | ICD-10-CM | POA: Diagnosis not present

## 2021-07-05 DIAGNOSIS — M5416 Radiculopathy, lumbar region: Secondary | ICD-10-CM | POA: Diagnosis not present

## 2021-07-05 DIAGNOSIS — T8484XA Pain due to internal orthopedic prosthetic devices, implants and grafts, initial encounter: Secondary | ICD-10-CM | POA: Diagnosis not present

## 2021-08-09 DIAGNOSIS — H353211 Exudative age-related macular degeneration, right eye, with active choroidal neovascularization: Secondary | ICD-10-CM | POA: Diagnosis not present

## 2021-08-13 DIAGNOSIS — M48061 Spinal stenosis, lumbar region without neurogenic claudication: Secondary | ICD-10-CM | POA: Diagnosis not present

## 2021-09-10 DIAGNOSIS — G5623 Lesion of ulnar nerve, bilateral upper limbs: Secondary | ICD-10-CM | POA: Diagnosis not present

## 2021-09-10 DIAGNOSIS — G5603 Carpal tunnel syndrome, bilateral upper limbs: Secondary | ICD-10-CM | POA: Diagnosis not present

## 2021-09-10 DIAGNOSIS — M48061 Spinal stenosis, lumbar region without neurogenic claudication: Secondary | ICD-10-CM | POA: Diagnosis not present

## 2021-09-10 DIAGNOSIS — M542 Cervicalgia: Secondary | ICD-10-CM | POA: Diagnosis not present

## 2021-09-18 DIAGNOSIS — G5623 Lesion of ulnar nerve, bilateral upper limbs: Secondary | ICD-10-CM | POA: Diagnosis not present

## 2021-09-18 DIAGNOSIS — G5603 Carpal tunnel syndrome, bilateral upper limbs: Secondary | ICD-10-CM | POA: Diagnosis not present

## 2021-10-05 DIAGNOSIS — G5623 Lesion of ulnar nerve, bilateral upper limbs: Secondary | ICD-10-CM | POA: Diagnosis not present

## 2021-10-05 DIAGNOSIS — G5603 Carpal tunnel syndrome, bilateral upper limbs: Secondary | ICD-10-CM | POA: Diagnosis not present

## 2021-10-15 DIAGNOSIS — M25522 Pain in left elbow: Secondary | ICD-10-CM | POA: Diagnosis not present

## 2021-10-15 DIAGNOSIS — M79641 Pain in right hand: Secondary | ICD-10-CM | POA: Diagnosis not present

## 2021-10-15 DIAGNOSIS — M25521 Pain in right elbow: Secondary | ICD-10-CM | POA: Diagnosis not present

## 2021-10-15 DIAGNOSIS — M79642 Pain in left hand: Secondary | ICD-10-CM | POA: Diagnosis not present

## 2021-10-18 DIAGNOSIS — H353211 Exudative age-related macular degeneration, right eye, with active choroidal neovascularization: Secondary | ICD-10-CM | POA: Diagnosis not present

## 2021-10-24 DIAGNOSIS — M79642 Pain in left hand: Secondary | ICD-10-CM | POA: Diagnosis not present

## 2021-10-24 DIAGNOSIS — M25522 Pain in left elbow: Secondary | ICD-10-CM | POA: Diagnosis not present

## 2021-10-24 DIAGNOSIS — M25521 Pain in right elbow: Secondary | ICD-10-CM | POA: Diagnosis not present

## 2021-10-24 DIAGNOSIS — M79641 Pain in right hand: Secondary | ICD-10-CM | POA: Diagnosis not present

## 2021-10-31 DIAGNOSIS — M25522 Pain in left elbow: Secondary | ICD-10-CM | POA: Diagnosis not present

## 2021-10-31 DIAGNOSIS — M25521 Pain in right elbow: Secondary | ICD-10-CM | POA: Diagnosis not present

## 2021-10-31 DIAGNOSIS — M79642 Pain in left hand: Secondary | ICD-10-CM | POA: Diagnosis not present

## 2021-10-31 DIAGNOSIS — M79641 Pain in right hand: Secondary | ICD-10-CM | POA: Diagnosis not present

## 2021-11-07 DIAGNOSIS — M25522 Pain in left elbow: Secondary | ICD-10-CM | POA: Diagnosis not present

## 2021-11-07 DIAGNOSIS — M25521 Pain in right elbow: Secondary | ICD-10-CM | POA: Diagnosis not present

## 2021-11-07 DIAGNOSIS — M79642 Pain in left hand: Secondary | ICD-10-CM | POA: Diagnosis not present

## 2021-11-07 DIAGNOSIS — M79641 Pain in right hand: Secondary | ICD-10-CM | POA: Diagnosis not present

## 2021-11-09 DIAGNOSIS — Z6827 Body mass index (BMI) 27.0-27.9, adult: Secondary | ICD-10-CM | POA: Diagnosis not present

## 2021-11-09 DIAGNOSIS — M5412 Radiculopathy, cervical region: Secondary | ICD-10-CM | POA: Diagnosis not present

## 2021-11-09 DIAGNOSIS — Z9181 History of falling: Secondary | ICD-10-CM | POA: Diagnosis not present

## 2021-11-09 DIAGNOSIS — R202 Paresthesia of skin: Secondary | ICD-10-CM | POA: Diagnosis not present

## 2021-11-09 DIAGNOSIS — Z1331 Encounter for screening for depression: Secondary | ICD-10-CM | POA: Diagnosis not present

## 2021-11-14 DIAGNOSIS — M79642 Pain in left hand: Secondary | ICD-10-CM | POA: Diagnosis not present

## 2021-11-14 DIAGNOSIS — M79641 Pain in right hand: Secondary | ICD-10-CM | POA: Diagnosis not present

## 2021-11-14 DIAGNOSIS — M25521 Pain in right elbow: Secondary | ICD-10-CM | POA: Diagnosis not present

## 2021-11-14 DIAGNOSIS — M25522 Pain in left elbow: Secondary | ICD-10-CM | POA: Diagnosis not present

## 2021-11-21 DIAGNOSIS — M25521 Pain in right elbow: Secondary | ICD-10-CM | POA: Diagnosis not present

## 2021-11-21 DIAGNOSIS — M79642 Pain in left hand: Secondary | ICD-10-CM | POA: Diagnosis not present

## 2021-11-21 DIAGNOSIS — M79641 Pain in right hand: Secondary | ICD-10-CM | POA: Diagnosis not present

## 2021-11-21 DIAGNOSIS — M25522 Pain in left elbow: Secondary | ICD-10-CM | POA: Diagnosis not present

## 2021-11-28 DIAGNOSIS — M79642 Pain in left hand: Secondary | ICD-10-CM | POA: Diagnosis not present

## 2021-11-28 DIAGNOSIS — M25522 Pain in left elbow: Secondary | ICD-10-CM | POA: Diagnosis not present

## 2021-11-28 DIAGNOSIS — M25521 Pain in right elbow: Secondary | ICD-10-CM | POA: Diagnosis not present

## 2021-11-28 DIAGNOSIS — M79641 Pain in right hand: Secondary | ICD-10-CM | POA: Diagnosis not present

## 2021-12-05 DIAGNOSIS — M79642 Pain in left hand: Secondary | ICD-10-CM | POA: Diagnosis not present

## 2021-12-05 DIAGNOSIS — M79641 Pain in right hand: Secondary | ICD-10-CM | POA: Diagnosis not present

## 2021-12-05 DIAGNOSIS — M25521 Pain in right elbow: Secondary | ICD-10-CM | POA: Diagnosis not present

## 2021-12-05 DIAGNOSIS — M25522 Pain in left elbow: Secondary | ICD-10-CM | POA: Diagnosis not present

## 2021-12-07 DIAGNOSIS — R200 Anesthesia of skin: Secondary | ICD-10-CM | POA: Diagnosis not present

## 2021-12-12 DIAGNOSIS — M79641 Pain in right hand: Secondary | ICD-10-CM | POA: Diagnosis not present

## 2021-12-12 DIAGNOSIS — M79642 Pain in left hand: Secondary | ICD-10-CM | POA: Diagnosis not present

## 2021-12-12 DIAGNOSIS — M25522 Pain in left elbow: Secondary | ICD-10-CM | POA: Diagnosis not present

## 2021-12-12 DIAGNOSIS — M25521 Pain in right elbow: Secondary | ICD-10-CM | POA: Diagnosis not present

## 2021-12-19 DIAGNOSIS — M25521 Pain in right elbow: Secondary | ICD-10-CM | POA: Diagnosis not present

## 2021-12-19 DIAGNOSIS — M25522 Pain in left elbow: Secondary | ICD-10-CM | POA: Diagnosis not present

## 2021-12-19 DIAGNOSIS — M79642 Pain in left hand: Secondary | ICD-10-CM | POA: Diagnosis not present

## 2021-12-19 DIAGNOSIS — M79641 Pain in right hand: Secondary | ICD-10-CM | POA: Diagnosis not present

## 2021-12-24 DIAGNOSIS — I25118 Atherosclerotic heart disease of native coronary artery with other forms of angina pectoris: Secondary | ICD-10-CM | POA: Diagnosis not present

## 2021-12-24 DIAGNOSIS — J40 Bronchitis, not specified as acute or chronic: Secondary | ICD-10-CM | POA: Diagnosis not present

## 2021-12-24 DIAGNOSIS — U071 COVID-19: Secondary | ICD-10-CM | POA: Diagnosis not present

## 2021-12-24 DIAGNOSIS — J329 Chronic sinusitis, unspecified: Secondary | ICD-10-CM | POA: Diagnosis not present

## 2022-01-02 DIAGNOSIS — M25521 Pain in right elbow: Secondary | ICD-10-CM | POA: Diagnosis not present

## 2022-01-02 DIAGNOSIS — M25522 Pain in left elbow: Secondary | ICD-10-CM | POA: Diagnosis not present

## 2022-01-02 DIAGNOSIS — M79641 Pain in right hand: Secondary | ICD-10-CM | POA: Diagnosis not present

## 2022-01-02 DIAGNOSIS — M79642 Pain in left hand: Secondary | ICD-10-CM | POA: Diagnosis not present

## 2022-01-10 DIAGNOSIS — H353211 Exudative age-related macular degeneration, right eye, with active choroidal neovascularization: Secondary | ICD-10-CM | POA: Diagnosis not present

## 2022-01-10 DIAGNOSIS — H353122 Nonexudative age-related macular degeneration, left eye, intermediate dry stage: Secondary | ICD-10-CM | POA: Diagnosis not present

## 2022-01-15 DIAGNOSIS — Z6825 Body mass index (BMI) 25.0-25.9, adult: Secondary | ICD-10-CM | POA: Diagnosis not present

## 2022-01-15 DIAGNOSIS — J849 Interstitial pulmonary disease, unspecified: Secondary | ICD-10-CM | POA: Diagnosis not present

## 2022-01-22 DIAGNOSIS — Z6825 Body mass index (BMI) 25.0-25.9, adult: Secondary | ICD-10-CM | POA: Diagnosis not present

## 2022-01-22 DIAGNOSIS — J849 Interstitial pulmonary disease, unspecified: Secondary | ICD-10-CM | POA: Diagnosis not present

## 2022-01-22 DIAGNOSIS — Z23 Encounter for immunization: Secondary | ICD-10-CM | POA: Diagnosis not present

## 2022-01-23 DIAGNOSIS — M25521 Pain in right elbow: Secondary | ICD-10-CM | POA: Diagnosis not present

## 2022-01-23 DIAGNOSIS — M25522 Pain in left elbow: Secondary | ICD-10-CM | POA: Diagnosis not present

## 2022-01-23 DIAGNOSIS — M79641 Pain in right hand: Secondary | ICD-10-CM | POA: Diagnosis not present

## 2022-01-23 DIAGNOSIS — M79642 Pain in left hand: Secondary | ICD-10-CM | POA: Diagnosis not present

## 2022-01-30 DIAGNOSIS — M79642 Pain in left hand: Secondary | ICD-10-CM | POA: Diagnosis not present

## 2022-01-30 DIAGNOSIS — M79641 Pain in right hand: Secondary | ICD-10-CM | POA: Diagnosis not present

## 2022-01-30 DIAGNOSIS — M25521 Pain in right elbow: Secondary | ICD-10-CM | POA: Diagnosis not present

## 2022-01-30 DIAGNOSIS — M25522 Pain in left elbow: Secondary | ICD-10-CM | POA: Diagnosis not present

## 2022-02-06 DIAGNOSIS — M79642 Pain in left hand: Secondary | ICD-10-CM | POA: Diagnosis not present

## 2022-02-06 DIAGNOSIS — M25522 Pain in left elbow: Secondary | ICD-10-CM | POA: Diagnosis not present

## 2022-02-06 DIAGNOSIS — M79641 Pain in right hand: Secondary | ICD-10-CM | POA: Diagnosis not present

## 2022-02-06 DIAGNOSIS — M25521 Pain in right elbow: Secondary | ICD-10-CM | POA: Diagnosis not present

## 2022-02-13 DIAGNOSIS — M25521 Pain in right elbow: Secondary | ICD-10-CM | POA: Diagnosis not present

## 2022-02-13 DIAGNOSIS — M25522 Pain in left elbow: Secondary | ICD-10-CM | POA: Diagnosis not present

## 2022-02-13 DIAGNOSIS — M79642 Pain in left hand: Secondary | ICD-10-CM | POA: Diagnosis not present

## 2022-02-13 DIAGNOSIS — M79641 Pain in right hand: Secondary | ICD-10-CM | POA: Diagnosis not present

## 2022-02-19 DIAGNOSIS — M25522 Pain in left elbow: Secondary | ICD-10-CM | POA: Diagnosis not present

## 2022-02-19 DIAGNOSIS — M79642 Pain in left hand: Secondary | ICD-10-CM | POA: Diagnosis not present

## 2022-02-19 DIAGNOSIS — M25521 Pain in right elbow: Secondary | ICD-10-CM | POA: Diagnosis not present

## 2022-02-19 DIAGNOSIS — M79641 Pain in right hand: Secondary | ICD-10-CM | POA: Diagnosis not present

## 2022-03-06 ENCOUNTER — Other Ambulatory Visit: Payer: Self-pay

## 2022-03-06 ENCOUNTER — Encounter: Payer: Self-pay | Admitting: Physician Assistant

## 2022-03-06 ENCOUNTER — Ambulatory Visit: Payer: PPO | Attending: Physician Assistant | Admitting: Physician Assistant

## 2022-03-06 VITALS — BP 132/58 | HR 87 | Ht 71.0 in | Wt 188.6 lb

## 2022-03-06 DIAGNOSIS — I2699 Other pulmonary embolism without acute cor pulmonale: Secondary | ICD-10-CM

## 2022-03-06 DIAGNOSIS — I10 Essential (primary) hypertension: Secondary | ICD-10-CM | POA: Diagnosis not present

## 2022-03-06 DIAGNOSIS — I2510 Atherosclerotic heart disease of native coronary artery without angina pectoris: Secondary | ICD-10-CM

## 2022-03-06 DIAGNOSIS — E785 Hyperlipidemia, unspecified: Secondary | ICD-10-CM | POA: Diagnosis not present

## 2022-03-06 MED ORDER — APIXABAN 5 MG PO TABS: 5.0000 mg | ORAL_TABLET | Freq: Two times a day (BID) | ORAL | 3 refills | Status: DC

## 2022-03-06 NOTE — Progress Notes (Signed)
Cardiology Office Note:    Date:  03/08/2022   ID:  Ricardo Soto, DOB 1931-05-24, MRN [id number]  PCP:  Mateo Flow, MD   [place] Providers Cardiologist:  Kirk Ruths, MD     Referring MD: Mateo Flow, MD   Chief Complaint  Patient presents with   Follow-up    Seen for Dr. Stanford Breed    History of Present Illness:    Ricardo Soto is a 88 y.o. male with a hx of CAD, CKD stage III, hypertension, hyperlipidemia and PE.  Patient had a bare-metal stent to his LAD in December 2006.  Abdominal ultrasound in July 2007 showed no aneurysm.  Myoview in August 2013 was normal without scar or ischemia, EF 65%.  Renal artery Doppler in August 2013 showed s/p left nephrectomy, normal right renal artery and exophytic right renal artery cyst.  Patient had PE in December 2016.  Echocardiogram at the time showed EF 45 to 50%, diffuse hypokinesis, grade 1 DD, D-shaped interventricular septum suggestive of RV pressure volume overload, severely dilated RV, moderately dilated right atrium.  He underwent VQ scan and lower extremity venous Doppler.  A VQ scan revealed intermediate probability for PE which explains his troponin elevation.  Venous Doppler consistent with acute DVT in the right gastrocnemius, left femoral vein and the left popliteal vein, left posterior tibial vein and left gastrocnemius.  Patient was last seen by Dr. Stanford Breed in November 2022 at which time he was doing well.  Patient presents today for follow-up.  He has been doing well without exertional chest pain or worsening dyspnea.  He is still functionally independent despite his advanced age.  He did have COVID back in September and later developed pneumonia, right prior to the diagnosis of COVID, he took 5 days of antiviral therapy, his breathing has largely recovered however still feels somewhat fatigued.  Shortly after he recovered from pneumonia, he drove to [place] to stay with his daughter for  Thanksgiving.  It took him 2 days to drive down there and the 2 more days to drive back.  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  Although long distance travel does increase his risk of developing DVT and PE, fortunately he is on Eliquis.  He is actually quite active given his age.  I recommended continue observation, if his fatigue worsens, we can consider echocardiogram.  Otherwise, patient appears to be doing well from the cardiac perspective.  Past Medical History:  Diagnosis Date   Acute pulmonary embolism ([place]) 03/15/2015   Arthritis    Osteo   BPH (benign prostatic hypertrophy)    s/p TURP   CAD (coronary artery disease)    a. 2006: s/p overlapping BMS-LAD 03/2005.otherwise had normal Cx and nonobstructive disease in RCA (30-40% multiple discrete lesions prox RCa, 50% mRCA). b. Neg nuc 2010.   Cancer [facility])    a. left kidney - s/p left nephrectomy.   CKD (chronic kidney disease), stage III ([place])    Essential hypertension    HLD (hyperlipidemia)    Kidney stone     Past Surgical History:  Procedure Laterality Date   APPENDECTOMY  1938   CARDIAC CATHETERIZATION  2006   2 sents in LAD   CATARACT EXTRACTION  1/01 and '04   CORONARY ANGIOPLASTY     EYE SURGERY     CATARACT   HERNIA REPAIR  12/00   HIP  ARTHROPLASTY  2010   Left   JOINT REPLACEMENT     left hip; bilateral knee   KNEE ARTHROPLASTY  2015   Right   left knee total arthroplasty  11/25/05   left nephrectomy  1/05   LUMBAR LAMINECTOMY/DECOMPRESSION MICRODISCECTOMY  12/05/2011   Procedure: LUMBAR LAMINECTOMY/DECOMPRESSION MICRODISCECTOMY 1 LEVEL;  Surgeon: Elaina Hoops, MD;  Location: [place] NEURO ORS;  Service: Neurosurgery;  Laterality: Bilateral;  lumbar four - five   LUMBAR LAMINECTOMY/DECOMPRESSION MICRODISCECTOMY  04/03/2012   Procedure: LUMBAR LAMINECTOMY/DECOMPRESSION MICRODISCECTOMY 1 LEVEL;  Surgeon: Elaina Hoops, MD;  Location: [place] NEURO  ORS;  Service: Neurosurgery;  Laterality: Left;  left lumbar four-five laminectony diskectomy   TRANSURETHRAL RESECTION OF PROSTATE  2004    Current Medications: Current Meds  Medication Sig   acetaminophen (TYLENOL) 500 MG tablet Take 500 mg by mouth every 6 (six) hours as needed (pain).   Cholecalciferol (VITAMIN D3) 50 MCG (2000 UT) TABS Take 2,000 Units by mouth daily.   Coenzyme Q10-Fish Oil-Vit E (CO-Q 10 OMEGA-3 FISH OIL PO) Take 2 capsules by mouth daily.   fexofenadine (ALLEGRA) 180 MG tablet Take 180 mg by mouth daily.   folic acid (FOLVITE) 1 MG tablet Take 1 mg by mouth daily.   Lutein-Zeaxanthin (VITEYES ESSENTIALS VISION SUPP PO) Take by mouth.   metoprolol succinate (TOPROL-XL) 25 MG 24 hr tablet TAKE 1 TABLET EVERY DAY   Multiple Vitamin (MULTIVITAMIN) tablet Joint supplement  1 tab po qd   rosuvastatin (CRESTOR) 10 MG tablet Take 1 tablet (10 mg total) by mouth daily.   [DISCONTINUED] ELIQUIS 5 MG TABS tablet TAKE 1 TABLET BY MOUTH TWICE A DAY     Allergies:   Patient has no known allergies.   Social History   Socioeconomic History   Marital status: Widowed    Spouse name: Not on file   Number of children: Not on file   Years of education: Not on file   Highest education level: Not on file  Occupational History   Not on file  Tobacco Use   Smoking status: Former    Years: 25.00    Types: Cigarettes    Quit date: 11/07/1982    Years since quitting: 39.3   Smokeless tobacco: Never   Tobacco comments:    1984  Substance and Sexual Activity   Alcohol use: Yes    Alcohol/week: 3.0 standard drinks of alcohol    Types: 1 Glasses of wine, 2 Cans of beer per week    Comment: 1-2 beers    Drug use: No   Sexual activity: Not on file  Other Topics Concern   Not on file  Social History Narrative   Widowed (wife died 5 years ago); 2 children - son, ; daughter,  - both alive   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    Social Determinants of Systems developer Strain: Not on file  Food Insecurity: Not on file  Transportation Needs: Not on file  Physical Activity: Not on file  Stress: Not on file  Social Connections: Not on file     Family History: The patient's family history includes Heart attack in his father and another family member; Heart disease in an other family member.  ROS:   Please see the history of present illness.     All other systems reviewed and are negative.  EKGs/Labs/Other Studies Reviewed:    The following studies were reviewed today:  Echo 03/13/2015 LV EF:  45% -   50%   -------------------------------------------------------------------  Indications:     Dyspnea 786.09.   -------------------------------------------------------------------  History:  PMH:   Coronary artery disease.  Risk factors:  Hypertension. Dyslipidemia.   -------------------------------------------------------------------  Study Conclusions   - Left ventricle: The cavity size was below normal. Wall thickness    was normal. Systolic function was mildly reduced. The estimated    ejection fraction was in the range of 45% to 50%. Diffuse    hypokinesis. Doppler parameters are consistent with abnormal left    ventricular relaxation (grade 1 diastolic dysfunction).  - Ventricular septum: D-shaped interventricular septum suggestive    of RV pressure/volume overload.  - Aortic valve: There was no stenosis.  - Mitral valve: There was no significant regurgitation.  - Right ventricle: The cavity size was severely dilated. Systolic    function was moderately reduced.  - Right atrium: The atrium was moderately dilated.  - Atrial septum: The septum bowed from right to left, consistent    with increased right atrial pressure.  - Tricuspid valve: Peak RV-RA gradient (S): 40 mm Hg.  - Pulmonary arteries: PA peak pressure: 55 mm Hg (S).  - Systemic veins: IVC measured 2.5 cm with < 50% respirophasic    variation, suggesting RA pressure 15  mmHg.   Impressions:   - The LV was small, compressed by the RV. EF estimate 45-50% with    diffuse hypokinesis. D-shaped interventricular septum suggesting    RV pressure/volume overload. Severely dilated RV with moderate    systolic dysfunction. Moderate pulmonary hypertension. Would    consider PE.   EKG:  EKG is ordered today.  The ekg ordered today demonstrates normal sinus rhythm, occasional PVCs, no significant ST-T wave changes.  Recent Labs: No results found for requested labs within last 365 days.  Recent Lipid Panel    Component Value Date/Time   CHOL 132 03/13/2015 0517   TRIG 104 03/13/2015 0517   HDL 36 (L) 03/13/2015 0517   CHOLHDL 3.7 03/13/2015 0517   VLDL 21 03/13/2015 0517   LDLCALC 75 03/13/2015 0517     Risk Assessment/Calculations:           Physical Exam:    VS:  BP (!) 132/58 (BP Location: Left Arm, Patient Position: Sitting, Cuff Size: Normal)   Pulse 87   Ht '5\' 11"'$  (1.803 m)   Wt 188 lb 9.6 oz (85.5 kg)   SpO2 96%   BMI 26.30 kg/m        Wt Readings from Last 3 Encounters:  03/06/22 188 lb 9.6 oz (85.5 kg)  02/13/21 192 lb (87.1 kg)  02/18/20 185 lb (83.9 kg)     GEN:  Well nourished, well developed in no acute distress HEENT: Normal NECK: No JVD; No carotid bruits LYMPHATICS: No lymphadenopathy CARDIAC: RRR, no murmurs, rubs, gallops RESPIRATORY:  Clear to auscultation without rales, wheezing or rhonchi  ABDOMEN: Soft, non-tender, non-distended MUSCULOSKELETAL:  No edema; No deformity  SKIN: Warm and dry NEUROLOGIC:  Alert and oriented x 3 PSYCHIATRIC:  Normal affect   ASSESSMENT:    1. Coronary artery disease involving native coronary artery of native heart without angina pectoris   2. Essential hypertension   3. Hyperlipidemia LDL goal <70   4. Pulmonary embolism without acute cor pulmonale, unspecified chronicity, unspecified pulmonary embolism type (HCC)    PLAN:    In order of problems listed above:  CAD: Denies  any recent exertional chest pain.  He does have fatigue, however  I suspect this is the residual effect related to recent COVID infection and subsequent pneumonia.  Overall, he is still doing well and functionally independent.  Hypertension: Blood pressure stable  Hyperlipidemia: On rosuvastatin  History of PE: Continue Eliquis.  No bleeding issues.         Medication Adjustments/Labs and Tests Ordered: Current medicines are reviewed at length with the patient today.  Concerns regarding medicines are outlined above.  Orders Placed This Encounter  Procedures   EKG 12-Lead   No orders of the defined types were placed in this encounter.   Patient Instructions  Medication Instructions:  Your physician recommends that you continue on your current medications as directed. Please refer to the Current Medication list given to you today.  *If you need a refill on your cardiac medications before your next appointment, please call your pharmacy*  Lab Work: NONE ordered at this time of appointment   If you have labs (blood work) drawn today and your tests are completely normal, you will receive your results only by: [place] (if you have MyChart) OR A paper copy in the mail If you have any lab test that is abnormal or we need to change your treatment, we will call you to review the results.  Testing/Procedures: NONE ordered at this time of appointment   Follow-Up: At [facility], you and your health needs are our priority.  As part of our continuing mission to provide you with exceptional heart care, we have created designated Provider Care Teams.  These Care Teams include your primary Cardiologist (physician) and Advanced Practice Providers (APPs -  Physician Assistants and Nurse Practitioners) who all work together to provide you with the care you need, when you need it.  Your next appointment:   1 year(s)  The format for your next appointment:   In  Person  Provider:   Kirk Ruths, MD     Other Instructions   Important Information About Sugar         Hilbert Corrigan, [place]  03/08/2022 9:00 PM    [place]

## 2022-03-06 NOTE — Patient Instructions (Signed)
Medication Instructions:  Your physician recommends that you continue on your current medications as directed. Please refer to the Current Medication list given to you today.  *If you need a refill on your cardiac medications before your next appointment, please call your pharmacy*  Lab Work: NONE ordered at this time of appointment   If you have labs (blood work) drawn today and your tests are completely normal, you will receive your results only by: [place] (if you have MyChart) OR A paper copy in the mail If you have any lab test that is abnormal or we need to change your treatment, we will call you to review the results.  Testing/Procedures: NONE ordered at this time of appointment   Follow-Up: At [facility], you and your health needs are our priority.  As part of our continuing mission to provide you with exceptional heart care, we have created designated Provider Care Teams.  These Care Teams include your primary Cardiologist (physician) and Advanced Practice Providers (APPs -  Physician Assistants and Nurse Practitioners) who all work together to provide you with the care you need, when you need it.  Your next appointment:   1 year(s)  The format for your next appointment:   In Person  Provider:   Kirk Ruths, MD     Other Instructions   Important Information About Sugar

## 2022-03-08 ENCOUNTER — Encounter: Payer: Self-pay | Admitting: Physician Assistant

## 2022-03-15 DIAGNOSIS — Z6826 Body mass index (BMI) 26.0-26.9, adult: Secondary | ICD-10-CM | POA: Diagnosis not present

## 2022-03-15 DIAGNOSIS — R29818 Other symptoms and signs involving the nervous system: Secondary | ICD-10-CM | POA: Diagnosis not present

## 2022-03-21 DIAGNOSIS — H353211 Exudative age-related macular degeneration, right eye, with active choroidal neovascularization: Secondary | ICD-10-CM | POA: Diagnosis not present

## 2022-04-02 DIAGNOSIS — M47816 Spondylosis without myelopathy or radiculopathy, lumbar region: Secondary | ICD-10-CM | POA: Diagnosis not present

## 2022-04-02 DIAGNOSIS — M5126 Other intervertebral disc displacement, lumbar region: Secondary | ICD-10-CM | POA: Diagnosis not present

## 2022-04-02 DIAGNOSIS — M48062 Spinal stenosis, lumbar region with neurogenic claudication: Secondary | ICD-10-CM | POA: Diagnosis not present

## 2022-04-04 ENCOUNTER — Telehealth: Payer: Self-pay

## 2022-04-04 NOTE — Patient Outreach (Signed)
  Care Coordination   04/04/2022 Name: Ricardo Soto MRN: [id number] DOB: 12/11/1931   Care Coordination Outreach Attempts:  An unsuccessful telephone outreach was attempted today to offer the patient information about available care coordination services as a benefit of their health plan.   Follow Up Plan:  Additional outreach attempts will be made to offer the patient care coordination information and services.   Encounter Outcome:  No Answer   Care Coordination Interventions:  No, not indicated    Tomasa Rand, RN, BSN, [facility] [employer] [phone]

## 2022-04-11 ENCOUNTER — Telehealth: Payer: Self-pay

## 2022-04-11 NOTE — Patient Outreach (Signed)
  Care Coordination   04/11/2022 Name: Ricardo Soto MRN: [id number] DOB: 09/17/1931   Care Coordination Outreach Attempts:  A second unsuccessful outreach was attempted today to offer the patient with information about available care coordination services as a benefit of their health plan.     Follow Up Plan:  Additional outreach attempts will be made to offer the patient care coordination information and services.   Encounter Outcome:  No Answer   Care Coordination Interventions:  No, not indicated    Tomasa Rand, RN, BSN, [facility] [employer] [phone]

## 2022-04-15 ENCOUNTER — Telehealth: Payer: Self-pay

## 2022-04-15 NOTE — Patient Outreach (Signed)
  Care Coordination   04/15/2022 Name: Ricardo Soto MRN: [id number] DOB: [birth year]   Care Coordination Outreach Attempts:  A third unsuccessful outreach was attempted today to offer the patient with information about available care coordination services as a benefit of their health plan.   Follow Up Plan:  No further outreach attempts will be made at this time. We have been unable to contact the patient to offer or enroll patient in care coordination services  Encounter Outcome:  No Answer   Care Coordination Interventions:  No, not indicated    Tomasa Rand, RN, BSN, [facility] [place] Coordinator [phone]

## 2022-04-16 DIAGNOSIS — M48061 Spinal stenosis, lumbar region without neurogenic claudication: Secondary | ICD-10-CM | POA: Diagnosis not present

## 2022-04-17 ENCOUNTER — Telehealth: Payer: Self-pay

## 2022-04-17 DIAGNOSIS — Z01818 Encounter for other preprocedural examination: Secondary | ICD-10-CM

## 2022-04-17 NOTE — Telephone Encounter (Signed)
   Pre-operative Risk Assessment    Patient Name: Ricardo Soto  DOB: 11-06-1931 MRN: [id number]{     Request for Surgical Clearance    Procedure:   Lumbar Spine-Bilateral-L4-L5- interlaminar  Date of Surgery:  Clearance TBD                                 Surgeon:  Lenord Carbo Surgeon's Group or Practice Name:  [facility] Phone number:  [phone] Fax number:  [phone]   Type of Clearance Requested:   - Pharmacy:  Hold Apixaban (Eliquis) 3 days   Type of Anesthesia:  Not Indicated   Additional requests/questions:  Please advise surgeon/provider what medications should be held.  Signed, [place] Handler   0/35/0093, 11:01 AM

## 2022-04-18 NOTE — Telephone Encounter (Signed)
Preop callback team, please ask patient to come in for CBC and BMET unless he has recent lab work from another provider that can be faxed to [place].

## 2022-04-18 NOTE — Telephone Encounter (Signed)
Can we see if he has newer labs?  I have both SCr and Platelets from 05/2021, but with his age, I would rather have a SCr more recent than 11 months ago

## 2022-04-19 ENCOUNTER — Other Ambulatory Visit: Payer: Self-pay | Admitting: *Deleted

## 2022-04-19 DIAGNOSIS — Z01818 Encounter for other preprocedural examination: Secondary | ICD-10-CM

## 2022-04-19 NOTE — Telephone Encounter (Signed)
Spoke with pt.  He will go to the [place] office on Monday 04/22/22, for BMET & CBC.

## 2022-04-22 DIAGNOSIS — Z01818 Encounter for other preprocedural examination: Secondary | ICD-10-CM | POA: Diagnosis not present

## 2022-04-22 LAB — BASIC METABOLIC PANEL
BUN/Creatinine Ratio: 23 (ref 10–24)
BUN: 29 mg/dL — ABNORMAL HIGH (ref 8–27)
CO2: 24 mmol/L (ref 20–29)
Calcium: 9.8 mg/dL (ref 8.6–10.2)
Chloride: 105 mmol/L (ref 96–106)
Creatinine, Ser: 1.25 mg/dL (ref 0.76–1.27)
Glucose: 93 mg/dL (ref 70–99)
Potassium: 4.7 mmol/L (ref 3.5–5.2)
Sodium: 143 mmol/L (ref 134–144)
eGFR: 55 mL/min/{1.73_m2} — ABNORMAL LOW (ref >59)

## 2022-04-23 LAB — CBC
Hematocrit: 39.3 % (ref 37.5–51.0)
Hemoglobin: 13 g/dL (ref 13.0–17.7)
MCH: 31.9 pg (ref 26.6–33.0)
MCHC: 33.1 g/dL (ref 31.5–35.7)
MCV: 97 fL (ref 79–97)
Platelets: 256 10*3/uL (ref 150–450)
RBC: 4.07 x10E6/uL — ABNORMAL LOW (ref 4.14–5.80)
RDW: 12.1 % (ref 11.6–15.4)
WBC: 7.9 10*3/uL (ref 3.4–10.8)

## 2022-04-23 NOTE — Telephone Encounter (Signed)
Patient with diagnosis of PE and DVT in December 2016 on Eliquis for anticoagulation. Has continued on long term anticoagulation since he travels frequently which could place him at higher risk of recurrence.    Procedure: Lumbar Spine-Bilateral-L4-L5- interlaminar  Date of procedure: TBD  CrCl 88m/min Platelet count 256K  Per office protocol, patient can hold Eliquis for 3 days prior to procedure.    **This guidance is not considered finalized until pre-operative APP has relayed final recommendations.**

## 2022-04-23 NOTE — Telephone Encounter (Signed)
   Name: Ricardo Soto  DOB: Jan 11, 1932  MRN: [id number]   Primary Cardiologist: Kirk Ruths, MD  Chart reviewed as part of pre-operative protocol coverage. Patient was contacted 04/23/2022 in reference to pre-operative risk assessment for pending surgery as outlined below.  Ricardo Soto was last seen on 03/06/2022 by Almyra Deforest, PA.  Since that day, Ricardo Soto has done well with no new cardiac complaints.  We reviewed his RCRI risk score of 11%.  He is also able to complete 4 METS of activity at this time.  He is limited by back pain and does use a cane to carry out ADLs.  He was informed regarding guidance for holding Eliquis as noted below.  Therefore, based on ACC/AHA guidelines, the patient would be at acceptable risk for the planned procedure without further cardiovascular testing.   The patient was advised that if he develops new symptoms prior to surgery to contact our office to arrange for a follow-up visit, and he verbalized understanding.  Procedure: Lumbar Spine-Bilateral-L4-L5- interlaminar  Date of procedure: TBD   CrCl 28m/min Platelet count 256K   Per office protocol, patient can hold Eliquis for 3 days prior to procedure.      I will route this recommendation to the requesting party via Epic fax function and remove from pre-op pool. Please call with questions.  DMable Fill EMarissa Nestle NP 04/23/2022, 10:16 AM

## 2022-04-23 NOTE — Telephone Encounter (Signed)
   Patient Name: Ricardo Soto  DOB: [birth year] MRN: [id number]  Primary Cardiologist: Kirk Ruths, MD  Chart reviewed as part of pre-operative protocol coverage.  Call attempted to discuss patient's upcoming surgical procedure and state of health.  Patient unfortunately not available at this time and detailed message left on voicemail to return call at his earliest convenience.     Mable Fill, Marissa Nestle, NP 04/23/2022, 9:35 AM

## 2022-04-30 DIAGNOSIS — M48062 Spinal stenosis, lumbar region with neurogenic claudication: Secondary | ICD-10-CM | POA: Diagnosis not present

## 2022-05-17 DIAGNOSIS — N183 Chronic kidney disease, stage 3 unspecified: Secondary | ICD-10-CM | POA: Diagnosis not present

## 2022-05-17 DIAGNOSIS — Z0000 Encounter for general adult medical examination without abnormal findings: Secondary | ICD-10-CM | POA: Diagnosis not present

## 2022-05-17 DIAGNOSIS — E785 Hyperlipidemia, unspecified: Secondary | ICD-10-CM | POA: Diagnosis not present

## 2022-05-17 DIAGNOSIS — Z6825 Body mass index (BMI) 25.0-25.9, adult: Secondary | ICD-10-CM | POA: Diagnosis not present

## 2022-05-17 DIAGNOSIS — M48061 Spinal stenosis, lumbar region without neurogenic claudication: Secondary | ICD-10-CM | POA: Diagnosis not present

## 2022-05-17 DIAGNOSIS — R7989 Other specified abnormal findings of blood chemistry: Secondary | ICD-10-CM | POA: Diagnosis not present

## 2022-05-17 DIAGNOSIS — M5416 Radiculopathy, lumbar region: Secondary | ICD-10-CM | POA: Diagnosis not present

## 2022-05-17 DIAGNOSIS — Z23 Encounter for immunization: Secondary | ICD-10-CM | POA: Diagnosis not present

## 2022-05-17 DIAGNOSIS — Z79899 Other long term (current) drug therapy: Secondary | ICD-10-CM | POA: Diagnosis not present

## 2022-05-17 DIAGNOSIS — Z9181 History of falling: Secondary | ICD-10-CM | POA: Diagnosis not present

## 2022-05-17 DIAGNOSIS — I25118 Atherosclerotic heart disease of native coronary artery with other forms of angina pectoris: Secondary | ICD-10-CM | POA: Diagnosis not present

## 2022-05-30 DIAGNOSIS — H353211 Exudative age-related macular degeneration, right eye, with active choroidal neovascularization: Secondary | ICD-10-CM | POA: Diagnosis not present

## 2022-08-08 DIAGNOSIS — H353211 Exudative age-related macular degeneration, right eye, with active choroidal neovascularization: Secondary | ICD-10-CM | POA: Diagnosis not present

## 2022-09-05 DIAGNOSIS — M48062 Spinal stenosis, lumbar region with neurogenic claudication: Secondary | ICD-10-CM | POA: Diagnosis not present

## 2022-09-05 DIAGNOSIS — Z6826 Body mass index (BMI) 26.0-26.9, adult: Secondary | ICD-10-CM | POA: Diagnosis not present

## 2022-09-06 ENCOUNTER — Telehealth: Payer: Self-pay | Admitting: *Deleted

## 2022-09-06 NOTE — Telephone Encounter (Signed)
   Pre-operative Risk Assessment    Patient Name: Ricardo Soto  DOB: Aug 25, 1931 MRN: [id number]      Request for Surgical Clearance    Procedure:   L3-4 LUMBAR LAMINECTOMY  Date of Surgery:  Clearance TBD                                 Surgeon:  DR. Donalee Citrin Surgeon's Group or Practice Name:  [facility] Phone number:  [phone] Fax number:  [phone] ATTN: VANESSA x [phone]   Type of Clearance Requested:   - Medical  - Pharmacy:  Hold Apixaban (Eliquis)     Type of Anesthesia:  General    Additional requests/questions:    [name]   09/06/2022, 5:33 PM

## 2022-09-09 NOTE — Telephone Encounter (Signed)
Patient with diagnosis of PE/DVT on Eliquis for anticoagulation.    Procedure:   L3-4 LUMBAR LAMINECTOMY  Date of procedure: TBD   CrCl 49 mL/min Platelet count 256K   Per office protocol, patient can hold Eliquis for 3 days prior to procedure.     **This guidance is not considered finalized until pre-operative APP has relayed final recommendations.**

## 2022-09-09 NOTE — Telephone Encounter (Signed)
   Name: Ricardo Soto  DOB: May 06, 1931  MRN: [id number]  Primary Cardiologist: Olga Millers, MD   Preoperative team, please contact this patient and set up a phone call appointment for further preoperative risk assessment. Please obtain consent and complete medication review. Thank you for your help.  I confirm that guidance regarding antiplatelet and oral anticoagulation therapy has been completed and, if necessary, noted below.  Per pharm D, may hold Eliquis 3 days prior to procedure.    Carlos Levering, NP 09/09/2022, 1:49 PM [facility]

## 2022-09-09 NOTE — Telephone Encounter (Signed)
1st attempt to reach pt to schedule tele visit for clearance. No answer lvm

## 2022-09-11 ENCOUNTER — Telehealth: Payer: Self-pay | Admitting: *Deleted

## 2022-09-11 NOTE — Telephone Encounter (Signed)
  Patient Consent for Virtual Visit        NEVO KLAUSEN has provided verbal consent on 09/11/2022 for a virtual visit (video or telephone).   CONSENT FOR VIRTUAL VISIT FOR:  Marianna Fuss Loomer  By participating in this virtual visit I agree to the following:  I hereby voluntarily request, consent and authorize [facility] and its employed or contracted physicians, physician assistants, nurse practitioners or other licensed health care professionals (the Practitioner), to provide me with telemedicine health care services (the "Services") as deemed necessary by the treating Practitioner. I acknowledge and consent to receive the Services by the Practitioner via telemedicine. I understand that the telemedicine visit will involve communicating with the Practitioner through live audiovisual communication technology and the disclosure of certain medical information by electronic transmission. I acknowledge that I have been given the opportunity to request an in-person assessment or other available alternative prior to the telemedicine visit and am voluntarily participating in the telemedicine visit.  I understand that I have the right to withhold or withdraw my consent to the use of telemedicine in the course of my care at any time, without affecting my right to future care or treatment, and that the Practitioner or I may terminate the telemedicine visit at any time. I understand that I have the right to inspect all information obtained and/or recorded in the course of the telemedicine visit and may receive copies of available information for a reasonable fee.  I understand that some of the potential risks of receiving the Services via telemedicine include:  Delay or interruption in medical evaluation due to technological equipment failure or disruption; Information transmitted may not be sufficient (e.g. poor resolution of images) to allow for appropriate medical decision making by the  Practitioner; and/or  In rare instances, security protocols could fail, causing a breach of personal health information.  Furthermore, I acknowledge that it is my responsibility to provide information about my medical history, conditions and care that is complete and accurate to the best of my ability. I acknowledge that Practitioner's advice, recommendations, and/or decision may be based on factors not within their control, such as incomplete or inaccurate data provided by me or distortions of diagnostic images or specimens that may result from electronic transmissions. I understand that the practice of medicine is not an exact science and that Practitioner makes no warranties or guarantees regarding treatment outcomes. I acknowledge that a copy of this consent can be made available to me via my patient portal [facility] MyChart), or I can request a printed copy by calling the office of [facility].    I understand that my insurance will be billed for this visit.   I have read or had this consent read to me. I understand the contents of this consent, which adequately explains the benefits and risks of the Services being provided via telemedicine.  I have been provided ample opportunity to ask questions regarding this consent and the Services and have had my questions answered to my satisfaction. I give my informed consent for the services to be provided through the use of telemedicine in my medical care

## 2022-09-11 NOTE — Telephone Encounter (Signed)
Spoke with patient and scheduled him for a telehealth pre-op clearance appointment on 09/16/2022 at 10:00 AM. Meds reviewed and consent in. Will route back to requesting surgeons office to make them aware.

## 2022-09-16 ENCOUNTER — Ambulatory Visit: Payer: PPO | Attending: Internal Medicine

## 2022-09-16 DIAGNOSIS — Z01810 Encounter for preprocedural cardiovascular examination: Secondary | ICD-10-CM | POA: Diagnosis not present

## 2022-09-16 NOTE — Progress Notes (Signed)
Virtual Visit via Telephone Note   Because of Ricardo Soto's co-morbid illnesses, he is at least at moderate risk for complications without adequate follow up.  This format is felt to be most appropriate for this patient at this time.  The patient did not have access to video technology/had technical difficulties with video requiring transitioning to audio format only (telephone).  All issues noted in this document were discussed and addressed.  No physical exam could be performed with this format.  Please refer to the patient's chart for his consent to telehealth for [facility].  Evaluation Performed:  Preoperative cardiovascular risk assessment _____________   Date:  09/16/2022   Patient ID:  Ricardo Soto, DOB [birth year], MRN [id number] Patient Location:  Home Provider location:   Office  Primary Care Provider:  Lise Auer, MD Primary Cardiologist:  Olga Millers, MD  Chief Complaint / Patient Profile   89 y.o. y/o male with a h/o CAD status post bare-metal stent to his LAD December 2006, CKD stage III, hypertension, hyperlipidemia, and PE who is pending L3-4 lumbar laminectomy and presents today for telephonic preoperative cardiovascular risk assessment.  History of Present Illness    Ricardo Soto is a 89 y.o. male who presents via audio/video conferencing for a telehealth visit today.  Pt was last seen in cardiology clinic on 03/06/2022 by Azalee Course, PA.  At that time Ricardo Soto was doing well.  The patient is now pending procedure as outlined above. Since his last visit, he tells me he has had this surgery before about 10 years ago and the incision is only an inch long.  He checks his blood pressure, oxygen, and pulse rate a couple of times a week.  Most recently blood pressure was 125/74, pulse was 80 and oxygen saturation was 98%.  He was going to physical therapy for a while for his hands but lately going just for the exercise  benefits.  He usually goes Monday Wednesday Friday and spends about an hour on the treadmill, Cybex machines.  Likes to do 3 sets of 10 at a moderate rate of strengthening exercises.  Also does the seated bike.  Sometimes with prolonged standing his feels like his legs are going to give out.  In his community there are chair exercises available as well.  He is scored above the 4 METS on the DASI.  Per office protocol, patient can hold Eliquis for 3 days prior to procedure. He can restart when medically safe to do so.    Past Medical History    Past Medical History:  Diagnosis Date   Acute pulmonary embolism (HCC) 03/15/2015   Arthritis    Osteo   BPH (benign prostatic hypertrophy)    s/p TURP   CAD (coronary artery disease)    a. 2006: s/p overlapping BMS-LAD 03/2005.otherwise had normal Cx and nonobstructive disease in RCA (30-40% multiple discrete lesions prox RCa, 50% mRCA). b. Neg nuc 2010.   Cancer [facility])    a. left kidney - s/p left nephrectomy.   CKD (chronic kidney disease), stage III (HCC)    Essential hypertension    HLD (hyperlipidemia)    Kidney stone    Past Surgical History:  Procedure Laterality Date   APPENDECTOMY  1938   CARDIAC CATHETERIZATION  2006   2 sents in LAD   CATARACT EXTRACTION  1/01 and '04   CORONARY ANGIOPLASTY     EYE SURGERY     CATARACT   HERNIA  REPAIR  12/00   HIP ARTHROPLASTY  2010   Left   JOINT REPLACEMENT     left hip; bilateral knee   KNEE ARTHROPLASTY  2015   Right   left knee total arthroplasty  11/25/05   left nephrectomy  1/05   LUMBAR LAMINECTOMY/DECOMPRESSION MICRODISCECTOMY  12/05/2011   Procedure: LUMBAR LAMINECTOMY/DECOMPRESSION MICRODISCECTOMY 1 LEVEL;  Surgeon: Mariam Dollar, MD;  Location: [facility];  Service: Neurosurgery;  Laterality: Bilateral;  lumbar four - five   LUMBAR LAMINECTOMY/DECOMPRESSION MICRODISCECTOMY  04/03/2012   Procedure: LUMBAR LAMINECTOMY/DECOMPRESSION MICRODISCECTOMY 1 LEVEL;  Surgeon: Mariam Dollar,  MD;  Location: [facility];  Service: Neurosurgery;  Laterality: Left;  left lumbar four-five laminectony diskectomy   TRANSURETHRAL RESECTION OF PROSTATE  2004    Allergies  No Known Allergies  Home Medications    Prior to Admission medications   Medication Sig Start Date End Date Taking? Authorizing Provider  acetaminophen (TYLENOL) 500 MG tablet Take 500 mg by mouth every 6 (six) hours as needed (pain).    [provider]  apixaban (ELIQUIS) 5 MG TABS tablet Take 1 tablet (5 mg total) by mouth 2 (two) times daily. 03/06/22   Lewayne Bunting, MD  Cholecalciferol (VITAMIN D3) 50 MCG (2000 UT) TABS Take 2,000 Units by mouth daily.    [provider]  Coenzyme Q10-Fish Oil-Vit E (CO-Q 10 OMEGA-3 FISH OIL PO) Take 2 capsules by mouth daily.    [provider]  fexofenadine (ALLEGRA) 180 MG tablet Take 180 mg by mouth daily.    [provider]  folic acid (FOLVITE) 1 MG tablet Take 1 mg by mouth daily.    [provider]  Lutein-Zeaxanthin (VITEYES ESSENTIALS VISION SUPP PO) Take by mouth.    [provider]  metoprolol succinate (TOPROL-XL) 25 MG 24 hr tablet TAKE 1 TABLET EVERY DAY 11/03/12   Lewayne Bunting, MD  Multiple Vitamin (MULTIVITAMIN) tablet Joint supplement  1 tab po qd    [provider]  rosuvastatin (CRESTOR) 10 MG tablet Take 1 tablet (10 mg total) by mouth daily. 04/12/16   Lewayne Bunting, MD    Physical Exam    Vital Signs:  Ricardo Soto does not have vital signs available for review today.  Given telephonic nature of communication, physical exam is limited. AAOx3. NAD. Normal affect.  Speech and respirations are unlabored.  Accessory Clinical Findings    None  Assessment & Plan    1.  Preoperative Cardiovascular Risk Assessment:  [name]'s perioperative risk of a major cardiac event is 6.6% according to the Revised Cardiac Risk Index (RCRI).  Therefore, he is at high risk for  perioperative complications.   His functional capacity is good at 5.07 METs according to the [facility] Activity Status Index (DASI). Recommendations: According to ACC/AHA guidelines, no further cardiovascular testing needed.  The patient may proceed to surgery at acceptable risk.   Antiplatelet and/or Anticoagulation Recommendations:  Eliquis (Apixaban) can be held for 3 days prior to surgery.  Please resume post op when felt to be safe.     The patient was advised that if he develops new symptoms prior to surgery to contact our office to arrange for a follow-up visit, and he verbalized understanding.  A copy of this note will be routed to requesting surgeon.  Time:   Today, I have spent 15 minutes with the patient with telehealth technology discussing medical history, symptoms, and management plan.     Julian Hy  Bretta Bang, PA-C  09/16/2022, 10:46 AM

## 2022-09-19 ENCOUNTER — Other Ambulatory Visit: Payer: Self-pay | Admitting: Neurosurgery

## 2022-09-24 NOTE — Pre-Procedure Instructions (Signed)
Surgical Instructions    Your procedure is scheduled on October 04, 2022.  Report to [facility] Main Entrance "A" at 5:30 A.M., then check in with the Admitting office.  Call this number if you have problems the morning of surgery:  [phone]  If you have any questions prior to your surgery date call [phone]: Open Monday-Friday 8am-4pm If you experience any cold or flu symptoms such as cough, fever, chills, shortness of breath, etc. between now and your scheduled surgery, please notify us at the above number.     Remember:  Do not eat after midnight the night before your surgery  You may drink clear liquids until 4:30 AM the morning of your surgery.   Clear liquids allowed are: Water, Non-Citrus Juices (without pulp), Carbonated Beverages, Clear Tea, Black Coffee Only (NO MILK, CREAM OR POWDERED CREAMER of any kind), and Gatorade.     Take these medicines the morning of surgery with A SIP OF WATER:  acetaminophen (TYLENOL) - may take if needed  Polyethyl Glycol-Propyl Glycol (SYSTANE OP) - may take if needed   Follow your surgeon's instructions on when to stop apixaban (ELIQUIS).  If no instructions were given by your surgeon then you will need to call the office to get those instructions.     As of today, STOP taking any Aspirin (unless otherwise instructed by your surgeon) Aleve, Naproxen, Ibuprofen, Motrin, Advil, Goody's, BC's, all herbal medications, fish oil, and all vitamins.                     Do NOT Smoke (Tobacco/Vaping) for 24 hours prior to your procedure.  If you use a CPAP at night, you may bring your mask/headgear for your overnight stay.   Contacts, glasses, piercing's, hearing aid's, dentures or partials may not be worn into surgery, please bring cases for these belongings.    For patients admitted to the hospital, discharge time will be determined by your treatment team.   Patients discharged the day of surgery will not be allowed to drive home, and  someone needs to stay with them for 24 hours.  SURGICAL WAITING ROOM VISITATION Patients having surgery or a procedure may have no more than 2 support people in the waiting area - these visitors may rotate.   Children under the age of 59 must have an adult with them who is not the patient. If the patient needs to stay at the hospital during part of their recovery, the visitor guidelines for inpatient rooms apply. Pre-op nurse will coordinate an appropriate time for 1 support person to accompany patient in pre-op.  This support person may not rotate.   Please refer to the [facility] website for the visitor guidelines for Inpatients (after your surgery is over and you are in a regular room).   If you received a COVID test during your pre-op visit  it is requested that you wear a mask when out in public, stay away from anyone that may not be feeling well and notify your surgeon if you develop symptoms. If you have been in contact with anyone that has tested positive in the last 10 days please notify you surgeon.     Pre-operative 5 CHG Bath Instructions   You can play a key role in reducing the risk of infection after surgery. Your skin needs to be as free of germs as possible. You can reduce the number of germs on your skin by washing with CHG (chlorhexidine gluconate) soap before surgery.  CHG is an antiseptic soap that kills germs and continues to kill germs even after washing.   DO NOT use if you have an allergy to chlorhexidine/CHG or antibacterial soaps. If your skin becomes reddened or irritated, stop using the CHG and notify one of our RNs at [phone].   Please shower with the CHG soap starting 4 days before surgery using the following schedule:     Please keep in mind the following:  DO NOT shave, including legs and underarms, starting the day of your first shower.   You may shave your face at any point before/day of surgery.  Place clean sheets on your bed the day you start  using CHG soap. Use a clean washcloth (not used since being washed) for each shower. DO NOT sleep with pets once you start using the CHG.   CHG Shower Instructions:  If you choose to wash your hair and private area, wash first with your normal shampoo/soap.  After you use shampoo/soap, rinse your hair and body thoroughly to remove shampoo/soap residue.  Turn the water OFF and apply about 3 tablespoons (45 ml) of CHG soap to a CLEAN washcloth.  Apply CHG soap ONLY FROM YOUR NECK DOWN TO YOUR TOES (washing for 3-5 minutes)  DO NOT use CHG soap on face, private areas, open wounds, or sores.  Pay special attention to the area where your surgery is being performed.  If you are having back surgery, having someone wash your back for you may be helpful. Wait 2 minutes after CHG soap is applied, then you may rinse off the CHG soap.  Pat dry with a clean towel  Put on clean clothes/pajamas   If you choose to wear lotion, please use ONLY the CHG-compatible lotions on the back of this paper.     Additional instructions for the day of surgery: DO NOT APPLY any lotions, deodorants, cologne, or perfumes.   Do not wear jewelry or makeup Do not wear nail polish, gel polish, artificial nails, or any other type of covering on natural nails (fingers and toes) Do not bring valuables to the hospital. [facility] is not responsible for any belongings or valuables. Put on clean/comfortable clothes.  Brush your teeth.  Ask your nurse before applying any prescription medications to the skin.      CHG Compatible Lotions   Aveeno Moisturizing lotion  Cetaphil Moisturizing Cream  Cetaphil Moisturizing Lotion  Clairol Herbal Essence Moisturizing Lotion, Dry Skin  Clairol Herbal Essence Moisturizing Lotion, Extra Dry Skin  Clairol Herbal Essence Moisturizing Lotion, Normal Skin  Curel Age Defying Therapeutic Moisturizing Lotion with Alpha Hydroxy  Curel Extreme Care Body Lotion  Curel Soothing Hands  Moisturizing Hand Lotion  Curel Therapeutic Moisturizing Cream, Fragrance-Free  Curel Therapeutic Moisturizing Lotion, Fragrance-Free  Curel Therapeutic Moisturizing Lotion, Original Formula  Eucerin Daily Replenishing Lotion  Eucerin Dry Skin Therapy Plus Alpha Hydroxy Crme  Eucerin Dry Skin Therapy Plus Alpha Hydroxy Lotion  Eucerin Original Crme  Eucerin Original Lotion  Eucerin Plus Crme Eucerin Plus Lotion  Eucerin TriLipid Replenishing Lotion  Keri Anti-Bacterial Hand Lotion  Keri Deep Conditioning Original Lotion Dry Skin Formula Softly Scented  Keri Deep Conditioning Original Lotion, Fragrance Free Sensitive Skin Formula  Keri Lotion Fast Absorbing Fragrance Free Sensitive Skin Formula  Keri Lotion Fast Absorbing Softly Scented Dry Skin Formula  Keri Original Lotion  Keri Skin Renewal Lotion Keri Silky Smooth Lotion  Keri Silky Smooth Sensitive Skin Lotion  Nivea Body Creamy Conditioning Oil  Nivea  Body Extra Enriched Lotion  Capital One Body Original Lotion  Nivea Body Sheer Moisturizing Lotion Nivea Crme  Nivea Skin Firming Lotion  NutraDerm 30 Skin Lotion  NutraDerm Skin Lotion  NutraDerm Therapeutic Skin Cream  NutraDerm Therapeutic Skin Lotion  ProShield Protective Hand Cream  Provon moisturizing lotion   Please read over the following fact sheets that you were given.

## 2022-09-24 NOTE — Progress Notes (Signed)
Dr. Dola Argyle office called and spoke with Erie Noe, OR scheduler, to place surgical orders.

## 2022-09-25 ENCOUNTER — Encounter (HOSPITAL_COMMUNITY): Payer: Self-pay

## 2022-09-25 ENCOUNTER — Other Ambulatory Visit: Payer: Self-pay

## 2022-09-25 ENCOUNTER — Encounter (HOSPITAL_COMMUNITY)
Admission: RE | Admit: 2022-09-25 | Discharge: 2022-09-25 | Disposition: A | Discharge status: Home / Self Care | Payer: PPO | Source: Ambulatory Visit | Attending: Neurosurgery | Admitting: Neurosurgery

## 2022-09-25 VITALS — BP 119/83 | HR 79 | Temp 98.3°F | Resp 18 | Ht 71.0 in | Wt 192.4 lb

## 2022-09-25 DIAGNOSIS — E785 Hyperlipidemia, unspecified: Secondary | ICD-10-CM | POA: Diagnosis not present

## 2022-09-25 DIAGNOSIS — Z87891 Personal history of nicotine dependence: Secondary | ICD-10-CM | POA: Diagnosis not present

## 2022-09-25 DIAGNOSIS — N183 Chronic kidney disease, stage 3 unspecified: Secondary | ICD-10-CM | POA: Diagnosis not present

## 2022-09-25 DIAGNOSIS — Z905 Acquired absence of kidney: Secondary | ICD-10-CM | POA: Insufficient documentation

## 2022-09-25 DIAGNOSIS — Z86718 Personal history of other venous thrombosis and embolism: Secondary | ICD-10-CM | POA: Diagnosis not present

## 2022-09-25 DIAGNOSIS — I2510 Atherosclerotic heart disease of native coronary artery without angina pectoris: Secondary | ICD-10-CM | POA: Diagnosis not present

## 2022-09-25 DIAGNOSIS — Z7901 Long term (current) use of anticoagulants: Secondary | ICD-10-CM | POA: Diagnosis not present

## 2022-09-25 DIAGNOSIS — Z86711 Personal history of pulmonary embolism: Secondary | ICD-10-CM | POA: Diagnosis not present

## 2022-09-25 DIAGNOSIS — I129 Hypertensive chronic kidney disease with stage 1 through stage 4 chronic kidney disease, or unspecified chronic kidney disease: Secondary | ICD-10-CM | POA: Diagnosis not present

## 2022-09-25 DIAGNOSIS — Z96643 Presence of artificial hip joint, bilateral: Secondary | ICD-10-CM | POA: Insufficient documentation

## 2022-09-25 DIAGNOSIS — Z85828 Personal history of other malignant neoplasm of skin: Secondary | ICD-10-CM | POA: Diagnosis not present

## 2022-09-25 DIAGNOSIS — Z01818 Encounter for other preprocedural examination: Secondary | ICD-10-CM | POA: Insufficient documentation

## 2022-09-25 HISTORY — DX: Personal history of urinary calculi: Z87.442

## 2022-09-25 HISTORY — DX: Myoneural disorder, unspecified: G70.9

## 2022-09-25 LAB — BASIC METABOLIC PANEL
Anion gap: 10 (ref 5–15)
BUN: 29 mg/dL — ABNORMAL HIGH (ref 8–23)
CO2: 26 mmol/L (ref 22–32)
Calcium: 10 mg/dL (ref 8.9–10.3)
Chloride: 102 mmol/L (ref 98–111)
Creatinine, Ser: 1.44 mg/dL — ABNORMAL HIGH (ref 0.61–1.24)
GFR, Estimated: 47 mL/min — ABNORMAL LOW (ref >60)
Glucose, Bld: 92 mg/dL (ref 70–99)
Potassium: 4.6 mmol/L (ref 3.5–5.1)
Sodium: 138 mmol/L (ref 135–145)

## 2022-09-25 LAB — CBC
HCT: 44.3 % (ref 39.0–52.0)
Hemoglobin: 14.2 g/dL (ref 13.0–17.0)
MCH: 32.4 pg (ref 26.0–34.0)
MCHC: 32.1 g/dL (ref 30.0–36.0)
MCV: 101.1 fL — ABNORMAL HIGH (ref 80.0–100.0)
Platelets: 220 10*3/uL (ref 150–400)
RBC: 4.38 MIL/uL (ref 4.22–5.81)
RDW: 12.8 % (ref 11.5–15.5)
WBC: 10.1 10*3/uL (ref 4.0–10.5)
nRBC: 0 % (ref 0.0–0.2)

## 2022-09-25 LAB — SURGICAL PCR SCREEN
MRSA, PCR: NEGATIVE
Staphylococcus aureus: NEGATIVE

## 2022-09-25 NOTE — Progress Notes (Addendum)
PCP - Dr. Luna Kitchens Cardiologist - Dr. Olga Millers   PPM/ICD - Denies Device Orders - n/a Rep Notified - n/a  Chest x-ray - Denies EKG - 03/06/2022 Stress Test - 11/11/2011 ECHO - 03/13/2015 Cardiac Cath - 2006 - 2 stents placed  Sleep Study - Denies CPAP - n/a  No DM  Last dose of GLP1 agonist- n/a GLP1 instructions: n/a  Blood Thinner Instructions: Pt instructed to stop taking his Eliquis three days prior to surgery. His last dose will be June 24th Aspirin Instructions: n/a  ERAS Protcol - Clear liquids until 0430 morning of surgery PRE-SURGERY Ensure or G2- n/a  COVID TEST- n/a   Anesthesia review: Yes. Cardiac Clearance obtained. Pt has CKD as well as one kidney. He had a nephrectomy at [facility] due to a tumor that was found when he had a kidney stone. Tumor benign.  Patient denies shortness of breath, fever, cough and chest pain at PAT appointment. Pt denies any respiratory illness/infection in the last two months.   All instructions explained to the patient, with a verbal understanding of the material. Patient agrees to go over the instructions while at home for a better understanding. Patient also instructed to self quarantine after being tested for COVID-19. The opportunity to ask questions was provided.

## 2022-09-26 ENCOUNTER — Encounter (HOSPITAL_COMMUNITY): Payer: Self-pay

## 2022-09-26 NOTE — Anesthesia Preprocedure Evaluation (Addendum)
Anesthesia Evaluation  Patient identified by MRN, date of birth, ID band Patient awake    Reviewed: Allergy & Precautions, NPO status , Patient's Chart, lab work & pertinent test results  Airway Mallampati: I  TM Distance: >3 FB Neck ROM: Full    Dental  (+) Partial Lower   Pulmonary former smoker   breath sounds clear to auscultation       Cardiovascular hypertension, + CAD, + Past MI and + Cardiac Stents   Rhythm:Regular Rate:Normal     Neuro/Psych negative neurological ROS  negative psych ROS   GI/Hepatic negative GI ROS, Neg liver ROS,,,  Endo/Other  negative endocrine ROS    Renal/GU Renal disease     Musculoskeletal  (+) Arthritis ,    Abdominal   Peds  Hematology negative hematology ROS (+)   Anesthesia Other Findings   Reproductive/Obstetrics                             Anesthesia Physical Anesthesia Plan  ASA: 3  Anesthesia Plan: General   Post-op Pain Management: Tylenol PO (pre-op)*   Induction: Intravenous  PONV Risk Score and Plan: 3 and Ondansetron, Treatment may vary due to age or medical condition, TIVA and Propofol infusion  Airway Management Planned: Oral ETT  Additional Equipment: None  Intra-op Plan:   Post-operative Plan: Extubation in OR  Informed Consent: I have reviewed the patients History and Physical, chart, labs and discussed the procedure including the risks, benefits and alternatives for the proposed anesthesia with the patient or authorized representative who has indicated his/her understanding and acceptance.     Dental advisory given  Plan Discussed with: CRNA  Anesthesia Plan Comments: (PAT note written 09/26/2022 by Shonna Chock, PA-C.  )       Anesthesia Quick Evaluation

## 2022-09-26 NOTE — Progress Notes (Signed)
Anesthesia Chart Review:  Case: [id number] Date/Time: 10/04/22 0715   Procedure: Bilateral - L3-L4- laminectomy and foraminotomy (Bilateral: Back) - 3C   Anesthesia type: General   Pre-op diagnosis: Stenosis   [facility] / [facility]   Surgeons: Donalee Citrin, MD       DISCUSSION: Patient is an 89 year old male scheduled for the above procedure.  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's perioperative risk of a major cardiac event is 6.6% according to the Revised Cardiac Risk Index (RCRI).  Therefore, he is at high risk for perioperative complications.   His functional capacity is good at 5.07 METs according to the Duke Activity Status Index (DASI). Recommendations: According to ACC/AHA guidelines, no further cardiovascular testing needed.  The patient may proceed to surgery at acceptable risk.   Antiplatelet and/or Anticoagulation Recommendations:   Eliquis (Apixaban) can be held for 3 days prior to surgery.  Please resume post op when felt to be safe..."  Last Eliquis is scheduled for 09/30/2022.  Creatinine 1.44, BUN 29, eGFR 47 with comparison labs from 04/22/22 showing Creatinine 1.25, BUN 29, eGFR 55.   Anesthesia team to evaluate on the day of surgery.    VS: BP 119/83   Pulse 79   Temp 36.8 C   Resp 18   Ht 5\' 11"  (1.803 m)   Wt 87.3 kg   SpO2 97%   BMI 26.83 kg/m    PROVIDERS: Lise Auer, MD is PCP  Olga Millers, MD is cardiologist   LABS: Preoperative labs noted. See DISCUSSION.  (all labs  ordered are listed, but only abnormal results are displayed)  Labs Reviewed  BASIC METABOLIC PANEL - Abnormal; Notable for the following components:      Result Value   BUN 29 (*)    Creatinine, Ser 1.44 (*)    GFR, Estimated 47 (*)    All other components within normal limits  CBC - Abnormal; Notable for the following components:   MCV 101.1 (*)    All other components within normal limits  SURGICAL PCR SCREEN     IMAGES: MRI L-spine 04/02/22 ([facility]; [facility]): IMPRESSION: 1. Multilevel degenerative change throughout the lumbar spine. 2. Severe central canal stenosis L3-4 with severe subarticular stenosis bilaterally. Progressive stenosis since 2013. 3. Left laminectomy L4-5. Moderate subarticular and foraminal stenosis bilaterally. 4. Moderate subarticular and foraminal stenosis bilaterally L5-S1. 5. 26 x 18 mm fluid collection left iliacus muscle. This could represent a hematoma or abscess. Correlate with symptoms of infection.   EKG: 03/06/2022 ([facility]): Sinus rhythm with frequent premature ventricular complexes   CV: LE Venous Duplex 03/14/15: Summary:  Findings consistent with acute deep vein thrombosis involving the  right gastrocnemius vein, left femoral vein, left popliteal vein,  left posterial tibial vein, left peroneal vein, and left  gastrocnemius vein.    Echo 03/13/15 (in setting of PE/DVT): Study Conclusions  - Left ventricle: The cavity size was below normal. Wall thickness    was normal. Systolic function was mildly reduced. The estimated    ejection fraction was in the range of 45% to 50%. Diffuse    hypokinesis.  Doppler parameters are consistent with abnormal left    ventricular relaxation (grade 1 diastolic dysfunction).  - Ventricular septum: D-shaped interventricular septum suggestive    of RV pressure/volume overload.  - Aortic valve: There was no stenosis.  - Mitral valve: There was no significant regurgitation.  - Right ventricle:  The cavity size was severely dilated. Systolic    function was moderately reduced.  - Right atrium: The atrium was moderately dilated.  - Atrial septum: The septum bowed from right to left, consistent    with increased right atrial pressure.  - Tricuspid valve: Peak RV-RA gradient (S): 40 mm Hg.  - Pulmonary arteries: PA peak pressure: 55 mm Hg (S).  - Systemic veins: IVC measured 2.5 cm with < 50% respirophasic    variation, suggesting RA pressure 15 mmHg.   Impressions:  - The LV was small, compressed by the RV. EF estimate 45-50% with    diffuse hypokinesis. D-shaped interventricular septum suggesting    RV pressure/volume overload. Severely dilated RV with moderate    systolic dysfunction. Moderate pulmonary hypertension. Would    consider PE.    Past Medical History:  Diagnosis Date   Acute pulmonary embolism (HCC) 03/15/2015   Arthritis    Osteo   BPH (benign prostatic hypertrophy)    s/p TURP   CAD (coronary artery disease)    a. 2006: s/p overlapping BMS-LAD 03/2005.otherwise had normal Cx and nonobstructive disease in RCA (30-40% multiple discrete lesions prox RCa, 50% mRCA). b. Neg nuc 2010.   Cancer (HCC)    skin cancer removed on face   CKD (chronic kidney disease), stage III (HCC)    Essential hypertension    History of kidney stones    HLD (hyperlipidemia)    Kidney stone    Neuromuscular disorder (HCC)    Left leg numbness with foot drop   Pneumonia     Past Surgical History:  Procedure Laterality Date   APPENDECTOMY  04/08/1936   CARDIAC CATHETERIZATION  04/08/2004   2 sents in LAD   CATARACT EXTRACTION  1/01 and '04   CORONARY ANGIOPLASTY     EYE SURGERY     CATARACT   HERNIA REPAIR  03/09/1999   HIP ARTHROPLASTY  04/08/2008   Left   JOINT REPLACEMENT     left hip; bilateral knee   KNEE ARTHROPLASTY  04/08/2013   Right   left knee total arthroplasty  11/25/2005   left nephrectomy  04/09/2003   at [facility] removed for begnin tumor   LUMBAR  LAMINECTOMY/DECOMPRESSION MICRODISCECTOMY  12/05/2011   Procedure: LUMBAR LAMINECTOMY/DECOMPRESSION MICRODISCECTOMY 1 LEVEL;  Surgeon: Mariam Dollar, MD;  Location: [facility];  Service: Neurosurgery;  Laterality: Bilateral;  lumbar four - five   LUMBAR LAMINECTOMY/DECOMPRESSION MICRODISCECTOMY  04/03/2012   Procedure: LUMBAR LAMINECTOMY/DECOMPRESSION MICRODISCECTOMY 1 LEVEL;  Surgeon: Mariam Dollar, MD;  Location: [facility];  Service: Neurosurgery;  Laterality: Left;  left lumbar four-five laminectony diskectomy   TRANSURETHRAL RESECTION OF PROSTATE  04/08/2002    MEDICATIONS:  acetaminophen (TYLENOL) 500 MG tablet   apixaban (ELIQUIS) 5 MG TABS tablet   Cholecalciferol (VITAMIN D3) 50 MCG (2000 UT) TABS   Coenzyme Q10-Fish Oil-Vit E (CO-Q 10 OMEGA-3 FISH OIL PO)   folic acid (FOLVITE) 1 MG tablet   metoprolol succinate (TOPROL-XL) 25 MG 24 hr tablet   Multiple Vitamin (MULTIVITAMIN) tablet   Multiple Vitamins-Minerals (PRESERVISION AREDS 2) CAPS   Polyethyl Glycol-Propyl Glycol (SYSTANE OP)   rosuvastatin (CRESTOR) 10 MG tablet  No current facility-administered medications for this encounter.     Shonna Chock, PA-C Surgical Short Stay/Anesthesiology [facility] Phone [phone] [facility] Phone [phone] 09/26/2022 1:30 PM

## 2022-10-04 ENCOUNTER — Ambulatory Visit (HOSPITAL_COMMUNITY): Payer: PPO | Admitting: Vascular Surgery

## 2022-10-04 ENCOUNTER — Ambulatory Visit (HOSPITAL_COMMUNITY)
Admission: RE | Admit: 2022-10-04 | Discharge: 2022-10-05 | Disposition: A | Discharge status: Home / Self Care | Payer: PPO | Attending: Neurosurgery | Admitting: Neurosurgery

## 2022-10-04 ENCOUNTER — Encounter (HOSPITAL_COMMUNITY)
Admission: RE | Disposition: A | Discharge status: Home / Self Care | Payer: Self-pay | Source: Home / Self Care | Attending: Neurosurgery

## 2022-10-04 ENCOUNTER — Ambulatory Visit (HOSPITAL_COMMUNITY): Payer: PPO

## 2022-10-04 ENCOUNTER — Encounter (HOSPITAL_COMMUNITY): Payer: Self-pay | Admitting: Neurosurgery

## 2022-10-04 ENCOUNTER — Other Ambulatory Visit: Payer: Self-pay

## 2022-10-04 ENCOUNTER — Ambulatory Visit (HOSPITAL_BASED_OUTPATIENT_CLINIC_OR_DEPARTMENT_OTHER): Payer: PPO

## 2022-10-04 DIAGNOSIS — I10 Essential (primary) hypertension: Secondary | ICD-10-CM | POA: Insufficient documentation

## 2022-10-04 DIAGNOSIS — M47816 Spondylosis without myelopathy or radiculopathy, lumbar region: Secondary | ICD-10-CM | POA: Diagnosis not present

## 2022-10-04 DIAGNOSIS — Z85828 Personal history of other malignant neoplasm of skin: Secondary | ICD-10-CM | POA: Diagnosis not present

## 2022-10-04 DIAGNOSIS — Z87891 Personal history of nicotine dependence: Secondary | ICD-10-CM | POA: Insufficient documentation

## 2022-10-04 DIAGNOSIS — N183 Chronic kidney disease, stage 3 unspecified: Secondary | ICD-10-CM | POA: Diagnosis not present

## 2022-10-04 DIAGNOSIS — Z7901 Long term (current) use of anticoagulants: Secondary | ICD-10-CM | POA: Insufficient documentation

## 2022-10-04 DIAGNOSIS — Z86711 Personal history of pulmonary embolism: Secondary | ICD-10-CM | POA: Diagnosis not present

## 2022-10-04 DIAGNOSIS — I129 Hypertensive chronic kidney disease with stage 1 through stage 4 chronic kidney disease, or unspecified chronic kidney disease: Secondary | ICD-10-CM | POA: Diagnosis not present

## 2022-10-04 DIAGNOSIS — M48062 Spinal stenosis, lumbar region with neurogenic claudication: Secondary | ICD-10-CM

## 2022-10-04 DIAGNOSIS — Z86718 Personal history of other venous thrombosis and embolism: Secondary | ICD-10-CM | POA: Insufficient documentation

## 2022-10-04 DIAGNOSIS — Z955 Presence of coronary angioplasty implant and graft: Secondary | ICD-10-CM | POA: Diagnosis not present

## 2022-10-04 DIAGNOSIS — M48061 Spinal stenosis, lumbar region without neurogenic claudication: Secondary | ICD-10-CM | POA: Diagnosis present

## 2022-10-04 DIAGNOSIS — M5416 Radiculopathy, lumbar region: Secondary | ICD-10-CM | POA: Diagnosis not present

## 2022-10-04 DIAGNOSIS — M5136 Other intervertebral disc degeneration, lumbar region: Secondary | ICD-10-CM | POA: Diagnosis not present

## 2022-10-04 DIAGNOSIS — I2510 Atherosclerotic heart disease of native coronary artery without angina pectoris: Secondary | ICD-10-CM

## 2022-10-04 HISTORY — PX: LUMBAR LAMINECTOMY/DECOMPRESSION MICRODISCECTOMY: SHX5026

## 2022-10-04 SURGERY — LUMBAR LAMINECTOMY/DECOMPRESSION MICRODISCECTOMY 1 LEVEL
Anesthesia: General | Site: Back | Laterality: Bilateral

## 2022-10-04 MED ORDER — OCUVITE-LUTEIN PO CAPS: 1.0000 | ORAL_CAPSULE | Freq: Every day | ORAL | Status: DC

## 2022-10-04 MED ORDER — LIDOCAINE 2% (20 MG/ML) 5 ML SYRINGE
INTRAMUSCULAR | Status: DC | PRN
  Administered 2022-10-04: 80 mg via INTRAVENOUS

## 2022-10-04 MED ORDER — ACETAMINOPHEN 325 MG PO TABS
ORAL_TABLET | ORAL | Status: AC
  Filled 2022-10-04: qty 2

## 2022-10-04 MED ORDER — ACETAMINOPHEN 325 MG PO TABS
650.0000 mg | ORAL_TABLET | ORAL | Status: DC | PRN
  Administered 2022-10-04: 650 mg via ORAL

## 2022-10-04 MED ORDER — SODIUM CHLORIDE 0.9% FLUSH: 3.0000 mL | Freq: Two times a day (BID) | INTRAVENOUS | Status: DC

## 2022-10-04 MED ORDER — CEFAZOLIN SODIUM-DEXTROSE 2-4 GM/100ML-% IV SOLN
2.0000 g | Freq: Three times a day (TID) | INTRAVENOUS | Status: AC
  Administered 2022-10-04 (×2): 2 g via INTRAVENOUS
  Filled 2022-10-04 (×2): qty 100

## 2022-10-04 MED ORDER — FENTANYL CITRATE (PF) 100 MCG/2ML IJ SOLN
25.0000 ug | INTRAMUSCULAR | Status: DC | PRN
  Administered 2022-10-04: 25 ug via INTRAVENOUS

## 2022-10-04 MED ORDER — ROCURONIUM BROMIDE 10 MG/ML (PF) SYRINGE
PREFILLED_SYRINGE | INTRAVENOUS | Status: DC | PRN
  Administered 2022-10-04: 50 mg via INTRAVENOUS

## 2022-10-04 MED ORDER — FENTANYL CITRATE (PF) 250 MCG/5ML IJ SOLN
INTRAMUSCULAR | Status: AC
  Filled 2022-10-04: qty 5

## 2022-10-04 MED ORDER — PROSIGHT PO TABS
1.0000 | ORAL_TABLET | Freq: Every day | ORAL | Status: DC
  Administered 2022-10-04 – 2022-10-05 (×2): 1 via ORAL
  Filled 2022-10-04 (×2): qty 1

## 2022-10-04 MED ORDER — 0.9 % SODIUM CHLORIDE (POUR BTL) OPTIME
TOPICAL | Status: DC | PRN
  Administered 2022-10-04: 1000 mL

## 2022-10-04 MED ORDER — PHENYLEPHRINE HCL-NACL 20-0.9 MG/250ML-% IV SOLN
INTRAVENOUS | Status: DC | PRN
  Administered 2022-10-04: 30 ug/min via INTRAVENOUS

## 2022-10-04 MED ORDER — PHENOL 1.4 % MT LIQD: 1.0000 | OROMUCOSAL | Status: DC | PRN

## 2022-10-04 MED ORDER — DEXAMETHASONE SODIUM PHOSPHATE 10 MG/ML IJ SOLN
INTRAMUSCULAR | Status: AC
  Filled 2022-10-04: qty 1

## 2022-10-04 MED ORDER — HYDROMORPHONE HCL 1 MG/ML IJ SOLN: 0.5000 mg | INTRAMUSCULAR | Status: DC | PRN

## 2022-10-04 MED ORDER — THROMBIN (RECOMBINANT) 5000 UNITS EX SOLR
CUTANEOUS | Status: DC | PRN
  Administered 2022-10-04: 1 via TOPICAL

## 2022-10-04 MED ORDER — BUPIVACAINE HCL (PF) 0.25 % IJ SOLN
INTRAMUSCULAR | Status: DC | PRN
  Administered 2022-10-04: 10 mL

## 2022-10-04 MED ORDER — HYDROCODONE-ACETAMINOPHEN 5-325 MG PO TABS: 2.0000 | ORAL_TABLET | ORAL | Status: DC | PRN

## 2022-10-04 MED ORDER — DEXAMETHASONE SODIUM PHOSPHATE 10 MG/ML IJ SOLN
INTRAMUSCULAR | Status: DC | PRN
  Administered 2022-10-04: 10 mg via INTRAVENOUS

## 2022-10-04 MED ORDER — ROSUVASTATIN CALCIUM 5 MG PO TABS
10.0000 mg | ORAL_TABLET | Freq: Every evening | ORAL | Status: DC
  Administered 2022-10-04: 10 mg via ORAL
  Filled 2022-10-04: qty 2

## 2022-10-04 MED ORDER — ONDANSETRON HCL 4 MG/2ML IJ SOLN
INTRAMUSCULAR | Status: AC
  Filled 2022-10-04: qty 2

## 2022-10-04 MED ORDER — ALUM & MAG HYDROXIDE-SIMETH 200-200-20 MG/5ML PO SUSP: 30.0000 mL | Freq: Four times a day (QID) | ORAL | Status: DC | PRN

## 2022-10-04 MED ORDER — CEFAZOLIN SODIUM-DEXTROSE 2-4 GM/100ML-% IV SOLN
2.0000 g | INTRAVENOUS | Status: AC
  Administered 2022-10-04: 2 g via INTRAVENOUS
  Filled 2022-10-04: qty 100

## 2022-10-04 MED ORDER — FENTANYL CITRATE (PF) 100 MCG/2ML IJ SOLN
INTRAMUSCULAR | Status: AC
  Filled 2022-10-04: qty 2

## 2022-10-04 MED ORDER — ACETAMINOPHEN 650 MG RE SUPP: 650.0000 mg | RECTAL | Status: DC | PRN

## 2022-10-04 MED ORDER — ONDANSETRON HCL 4 MG/2ML IJ SOLN: 4.0000 mg | Freq: Four times a day (QID) | INTRAMUSCULAR | Status: DC | PRN

## 2022-10-04 MED ORDER — THROMBIN 5000 UNITS EX SOLR
CUTANEOUS | Status: AC
  Filled 2022-10-04: qty 10000

## 2022-10-04 MED ORDER — LACTATED RINGERS IV SOLN: INTRAVENOUS | Status: DC

## 2022-10-04 MED ORDER — VITAMIN D 25 MCG (1000 UNIT) PO TABS
2000.0000 [IU] | ORAL_TABLET | Freq: Every day | ORAL | Status: DC
  Administered 2022-10-04 – 2022-10-05 (×2): 2000 [IU] via ORAL
  Filled 2022-10-04 (×2): qty 2

## 2022-10-04 MED ORDER — ADULT MULTIVITAMIN W/MINERALS CH
1.0000 | ORAL_TABLET | Freq: Every day | ORAL | Status: DC
  Administered 2022-10-04 – 2022-10-05 (×2): 1 via ORAL
  Filled 2022-10-04 (×2): qty 1

## 2022-10-04 MED ORDER — PANTOPRAZOLE SODIUM 40 MG IV SOLR: 40.0000 mg | Freq: Every day | INTRAVENOUS | Status: DC

## 2022-10-04 MED ORDER — PHENYLEPHRINE 80 MCG/ML (10ML) SYRINGE FOR IV PUSH (FOR BLOOD PRESSURE SUPPORT)
PREFILLED_SYRINGE | INTRAVENOUS | Status: AC
  Filled 2022-10-04: qty 10

## 2022-10-04 MED ORDER — EPHEDRINE 5 MG/ML INJ
INTRAVENOUS | Status: AC
  Filled 2022-10-04: qty 5

## 2022-10-04 MED ORDER — CHLORHEXIDINE GLUCONATE 0.12 % MT SOLN
15.0000 mL | Freq: Once | OROMUCOSAL | Status: AC
  Administered 2022-10-04: 15 mL via OROMUCOSAL
  Filled 2022-10-04: qty 15

## 2022-10-04 MED ORDER — ONDANSETRON HCL 4 MG/2ML IJ SOLN
INTRAMUSCULAR | Status: DC | PRN
  Administered 2022-10-04: 4 mg via INTRAVENOUS

## 2022-10-04 MED ORDER — BUPIVACAINE HCL (PF) 0.25 % IJ SOLN
INTRAMUSCULAR | Status: AC
  Filled 2022-10-04: qty 30

## 2022-10-04 MED ORDER — EPHEDRINE SULFATE-NACL 50-0.9 MG/10ML-% IV SOSY
PREFILLED_SYRINGE | INTRAVENOUS | Status: DC | PRN
  Administered 2022-10-04: 5 mg via INTRAVENOUS

## 2022-10-04 MED ORDER — LIDOCAINE-EPINEPHRINE 1 %-1:100000 IJ SOLN
INTRAMUSCULAR | Status: DC | PRN
  Administered 2022-10-04: 10 mL

## 2022-10-04 MED ORDER — ACETAMINOPHEN 500 MG PO TABS
1000.0000 mg | ORAL_TABLET | Freq: Four times a day (QID) | ORAL | Status: DC
  Administered 2022-10-04 – 2022-10-05 (×4): 1000 mg via ORAL
  Filled 2022-10-04 (×3): qty 2

## 2022-10-04 MED ORDER — PROPOFOL 10 MG/ML IV BOLUS
INTRAVENOUS | Status: DC | PRN
  Administered 2022-10-04: 20 mg via INTRAVENOUS
  Administered 2022-10-04: 100 mg via INTRAVENOUS

## 2022-10-04 MED ORDER — ROCURONIUM BROMIDE 10 MG/ML (PF) SYRINGE
PREFILLED_SYRINGE | INTRAVENOUS | Status: AC
  Filled 2022-10-04: qty 10

## 2022-10-04 MED ORDER — CYCLOBENZAPRINE HCL 10 MG PO TABS
10.0000 mg | ORAL_TABLET | Freq: Three times a day (TID) | ORAL | Status: DC | PRN
  Administered 2022-10-04: 10 mg via ORAL
  Filled 2022-10-04: qty 1

## 2022-10-04 MED ORDER — PROPOFOL 10 MG/ML IV BOLUS
INTRAVENOUS | Status: AC
  Filled 2022-10-04: qty 20

## 2022-10-04 MED ORDER — SODIUM CHLORIDE 0.9 % IV SOLN
250.0000 mL | INTRAVENOUS | Status: DC
  Administered 2022-10-04: 250 mL via INTRAVENOUS

## 2022-10-04 MED ORDER — MENTHOL 3 MG MT LOZG: 1.0000 | LOZENGE | OROMUCOSAL | Status: DC | PRN

## 2022-10-04 MED ORDER — ORAL CARE MOUTH RINSE: 15.0000 mL | Freq: Once | OROMUCOSAL | Status: AC

## 2022-10-04 MED ORDER — FOLIC ACID 1 MG PO TABS
1.0000 mg | ORAL_TABLET | Freq: Every evening | ORAL | Status: DC
  Administered 2022-10-04: 1 mg via ORAL
  Filled 2022-10-04: qty 1

## 2022-10-04 MED ORDER — SUGAMMADEX SODIUM 200 MG/2ML IV SOLN
INTRAVENOUS | Status: DC | PRN
  Administered 2022-10-04: 200 mg via INTRAVENOUS

## 2022-10-04 MED ORDER — CHLORHEXIDINE GLUCONATE CLOTH 2 % EX PADS: 6.0000 | MEDICATED_PAD | Freq: Once | CUTANEOUS | Status: DC

## 2022-10-04 MED ORDER — PRESERVISION AREDS 2 PO CAPS: 1.0000 | ORAL_CAPSULE | Freq: Every day | ORAL | Status: DC

## 2022-10-04 MED ORDER — SODIUM CHLORIDE 0.9% FLUSH: 3.0000 mL | INTRAVENOUS | Status: DC | PRN

## 2022-10-04 MED ORDER — POLYVINYL ALCOHOL 1.4 % OP SOLN: 1.0000 [drp] | OPHTHALMIC | Status: DC | PRN

## 2022-10-04 MED ORDER — ACETAMINOPHEN 500 MG PO TABS: 1000.0000 mg | ORAL_TABLET | Freq: Once | ORAL | Status: DC

## 2022-10-04 MED ORDER — FENTANYL CITRATE (PF) 250 MCG/5ML IJ SOLN
INTRAMUSCULAR | Status: DC | PRN
  Administered 2022-10-04 (×2): 50 ug via INTRAVENOUS
  Administered 2022-10-04: 25 ug via INTRAVENOUS

## 2022-10-04 MED ORDER — PROPOFOL 500 MG/50ML IV EMUL
INTRAVENOUS | Status: DC | PRN
  Administered 2022-10-04: 125 ug/kg/min via INTRAVENOUS

## 2022-10-04 MED ORDER — CO-Q 10 OMEGA-3 FISH OIL PO CAPS: ORAL_CAPSULE | Freq: Every day | ORAL | Status: DC

## 2022-10-04 MED ORDER — ONDANSETRON HCL 4 MG PO TABS: 4.0000 mg | ORAL_TABLET | Freq: Four times a day (QID) | ORAL | Status: DC | PRN

## 2022-10-04 MED ORDER — LIDOCAINE-EPINEPHRINE 1 %-1:100000 IJ SOLN
INTRAMUSCULAR | Status: AC
  Filled 2022-10-04: qty 1

## 2022-10-04 MED ORDER — LIDOCAINE 2% (20 MG/ML) 5 ML SYRINGE
INTRAMUSCULAR | Status: AC
  Filled 2022-10-04: qty 5

## 2022-10-04 MED ORDER — POLYETHYL GLYCOL-PROPYL GLYCOL 0.4-0.3 % OP GEL: Freq: Every day | OPHTHALMIC | Status: DC | PRN

## 2022-10-04 MED ORDER — ACETAMINOPHEN 500 MG PO TABS: 500.0000 mg | ORAL_TABLET | Freq: Four times a day (QID) | ORAL | Status: DC | PRN

## 2022-10-04 MED ORDER — METOPROLOL SUCCINATE ER 25 MG PO TB24
25.0000 mg | ORAL_TABLET | Freq: Every evening | ORAL | Status: DC
  Administered 2022-10-04: 25 mg via ORAL
  Filled 2022-10-04: qty 1

## 2022-10-04 MED ORDER — PHENYLEPHRINE 80 MCG/ML (10ML) SYRINGE FOR IV PUSH (FOR BLOOD PRESSURE SUPPORT)
PREFILLED_SYRINGE | INTRAVENOUS | Status: DC | PRN
  Administered 2022-10-04: 160 ug via INTRAVENOUS

## 2022-10-04 MED ORDER — PANTOPRAZOLE SODIUM 40 MG PO TBEC
40.0000 mg | DELAYED_RELEASE_TABLET | Freq: Every day | ORAL | Status: DC
  Administered 2022-10-05: 40 mg via ORAL
  Filled 2022-10-04 (×2): qty 1

## 2022-10-04 SURGICAL SUPPLY — 51 items
ADH SKN CLS APL DERMABOND .7 (GAUZE/BANDAGES/DRESSINGS) ×1
APL SKNCLS STERI-STRIP NONHPOA (GAUZE/BANDAGES/DRESSINGS) ×1
BAG COUNTER SPONGE SURGICOUNT (BAG) ×2 IMPLANT
BAG SPNG CNTER NS LX DISP (BAG) ×1
BENZOIN TINCTURE PRP APPL 2/3 (GAUZE/BANDAGES/DRESSINGS) ×2 IMPLANT
BLADE CLIPPER SURG (BLADE) IMPLANT
BLADE SURG 11 STRL SS (BLADE) ×2 IMPLANT
BUR CUTTER 7.0 ROUND (BURR) ×2 IMPLANT
BUR MATCHSTICK NEURO 3.0 LAGG (BURR) ×2 IMPLANT
CANISTER SUCT 3000ML PPV (MISCELLANEOUS) ×2 IMPLANT
DERMABOND ADVANCED .7 DNX12 (GAUZE/BANDAGES/DRESSINGS) ×2 IMPLANT
DRAPE HALF SHEET 40X57 (DRAPES) IMPLANT
DRAPE LAPAROTOMY 100X72X124 (DRAPES) ×2 IMPLANT
DRAPE MICROSCOPE SLANT 54X150 (MISCELLANEOUS) ×2 IMPLANT
DRAPE SURG 17X23 STRL (DRAPES) ×2 IMPLANT
DRSG OPSITE POSTOP 4X6 (GAUZE/BANDAGES/DRESSINGS) IMPLANT
DURAPREP 26ML APPLICATOR (WOUND CARE) ×2 IMPLANT
ELECT REM PT RETURN 9FT ADLT (ELECTROSURGICAL) ×1
ELECTRODE REM PT RTRN 9FT ADLT (ELECTROSURGICAL) ×2 IMPLANT
GAUZE 4X4 16PLY ~~LOC~~+RFID DBL (SPONGE) IMPLANT
GAUZE SPONGE 4X4 12PLY STRL (GAUZE/BANDAGES/DRESSINGS) ×2 IMPLANT
GLOVE BIO SURGEON STRL SZ7 (GLOVE) IMPLANT
GLOVE BIO SURGEON STRL SZ8 (GLOVE) ×2 IMPLANT
GLOVE BIOGEL PI IND STRL 7.0 (GLOVE) IMPLANT
GLOVE EXAM NITRILE XL STR (GLOVE) IMPLANT
GLOVE INDICATOR 8.5 STRL (GLOVE) ×4 IMPLANT
GOWN STRL REUS W/ TWL LRG LVL3 (GOWN DISPOSABLE) ×2 IMPLANT
GOWN STRL REUS W/ TWL XL LVL3 (GOWN DISPOSABLE) ×4 IMPLANT
GOWN STRL REUS W/TWL 2XL LVL3 (GOWN DISPOSABLE) IMPLANT
GOWN STRL REUS W/TWL LRG LVL3 (GOWN DISPOSABLE) ×1
GOWN STRL REUS W/TWL XL LVL3 (GOWN DISPOSABLE) ×2
KIT BASIN OR (CUSTOM PROCEDURE TRAY) ×2 IMPLANT
KIT TURNOVER KIT B (KITS) ×2 IMPLANT
NDL HYPO 22X1.5 SAFETY MO (MISCELLANEOUS) ×2 IMPLANT
NDL SPNL 22GX3.5 QUINCKE BK (NEEDLE) ×2 IMPLANT
NEEDLE HYPO 22X1.5 SAFETY MO (MISCELLANEOUS) ×1 IMPLANT
NEEDLE SPNL 22GX3.5 QUINCKE BK (NEEDLE) ×1 IMPLANT
NS IRRIG 1000ML POUR BTL (IV SOLUTION) ×2 IMPLANT
PACK LAMINECTOMY NEURO (CUSTOM PROCEDURE TRAY) ×2 IMPLANT
SOL ELECTROSURG ANTI STICK (MISCELLANEOUS) ×1
SOLUTION ELECTROSURG ANTI STCK (MISCELLANEOUS) ×2 IMPLANT
SPIKE FLUID TRANSFER (MISCELLANEOUS) ×2 IMPLANT
SPONGE SURGIFOAM ABS GEL SZ50 (HEMOSTASIS) ×2 IMPLANT
STRIP CLOSURE SKIN 1/2X4 (GAUZE/BANDAGES/DRESSINGS) ×2 IMPLANT
SUT VIC AB 0 CT1 18XCR BRD8 (SUTURE) ×2 IMPLANT
SUT VIC AB 0 CT1 8-18 (SUTURE) ×1
SUT VIC AB 2-0 CT1 18 (SUTURE) ×2 IMPLANT
SUT VICRYL 4-0 PS2 18IN ABS (SUTURE) ×2 IMPLANT
TOWEL GREEN STERILE (TOWEL DISPOSABLE) ×2 IMPLANT
TOWEL GREEN STERILE FF (TOWEL DISPOSABLE) ×2 IMPLANT
WATER STERILE IRR 1000ML POUR (IV SOLUTION) ×2 IMPLANT

## 2022-10-04 NOTE — Anesthesia Procedure Notes (Signed)
Procedure Name: Intubation Date/Time: 10/04/2022 7:47 AM  Performed by: Kayleen Memos, CRNAPre-anesthesia Checklist: Patient identified, Emergency Drugs available, Suction available, Patient being monitored and Timeout performed Patient Re-evaluated:Patient Re-evaluated prior to induction Oxygen Delivery Method: Circle system utilized Preoxygenation: Pre-oxygenation with 100% oxygen Induction Type: IV induction Ventilation: Mask ventilation without difficulty Laryngoscope Size: Mac and 4 Grade View: Grade I Tube type: Oral Number of attempts: 1 Airway Equipment and Method: Stylet Placement Confirmation: ETT inserted through vocal cords under direct vision, CO2 detector, positive ETCO2 and breath sounds checked- equal and bilateral Secured at: 22 cm Tube secured with: Tape Dental Injury: Teeth and Oropharynx as per pre-operative assessment  Comments: Patient agrees preop to paramedic student intubation; patient successfully intubated by student Earna Coder

## 2022-10-04 NOTE — Plan of Care (Signed)

## 2022-10-04 NOTE — Anesthesia Postprocedure Evaluation (Signed)
Anesthesia Post Note  Patient: Ricardo Soto  Procedure(s) Performed: Bilateral - Lumbar three-Lumbar four- laminectomy and foraminotomy (Bilateral: Back)     Patient location during evaluation: PACU Anesthesia Type: General Level of consciousness: awake and alert Pain management: pain level controlled Vital Signs Assessment: post-procedure vital signs reviewed and stable Respiratory status: spontaneous breathing, nonlabored ventilation, respiratory function stable and patient connected to nasal cannula oxygen Cardiovascular status: blood pressure returned to baseline and stable Postop Assessment: no apparent nausea or vomiting Anesthetic complications: no  No notable events documented.  Last Vitals:  Vitals:   10/04/22 1135 10/04/22 1546  BP: (!) 148/69 117/66  Pulse: (!) 59 76  Resp: 20 19  Temp: 36.5 C 36.4 C  SpO2: 97% 97%    Last Pain:  Vitals:   10/04/22 1546  TempSrc: Oral  PainSc:                  Shelton Silvas

## 2022-10-04 NOTE — Op Note (Signed)
Preoperative diagnosis: Lumbar spinal stenosis L3-4 bilateral L3-L4 radiculopathies with neurogenic claudication.  Postoperative diagnosis: Same.  Procedure: Decompressive lumbar laminectomy L3-4 with partial medial facetectomies and foraminotomies of the L3 and L4 nerve roots bilaterally.  Surgeon: Donalee Citrin.  Assistant: Julien Girt.  Anesthesia: General.  EBL: Minimal.  HPI: 89 year old gentleman with severe back and bilateral hip and leg pain and difficulty walking.  Claudicate over very short distances workup revealed severe spinal stenosis at L3-4.  Due to patient's progression of clinical syndrome imaging findings of a conservative treatment I recommended decompressive laminectomy at that level.  I extensively reviewed the risks and benefits of the operation with the patient as well as perioperative course expectations of outcome and alternatives of surgery and he understood and agreed to proceed forward.  Operative procedure: Patient was brought into the OR was induced under general anesthesia positioned prone Wilson frame.  His back was prepped and draped in routine sterile fashion his old incision was then I was identified and extended slightly cephalad after infiltration of 10 cc lidocaine with epi midline incision was made and Bovie electrocautery was used take down subcutaneous tissue and subperiosteal dissection was carried lamina of L3 and L4 confirmed by interoperative x-ray.  Then the spinous process at L3 was removed lamina was drilled down central decompression was begun.  I marched superiorly until I saw epidural fat and was at the top part of the L3 lamina then marching inferiorly there was extensive mount of facet arthropathy and hourglass compression of the thecal sac this was teased off of the dura with a 4 Penfield and removed in piecemeal fashion with a 2 and 3 Miller Kerrison punch.  Foraminotomies and partial medial facetectomy was then performed bilaterally opening up  the neural foramina at L3 and L4 bilaterally moving an extensive mount of osteophyte bone spur until at the end of the decompression there is no further stenosis either centrally or foraminally.  The wound was then copiously irrigated meticulous hemostasis was maintained Gelfoam was overlaid top of the dura and the muscle and fascia approximate layers with interrupted Vicryl and skin was closed running 4 subcuticular.  Dermabond benzoin Steri-Strips and a sterile dressing was applied patient recovery in stable condition.  At the end the case all needle counts and sponge counts were correct.

## 2022-10-04 NOTE — Transfer of Care (Signed)
Immediate Anesthesia Transfer of Care Note  Patient: Ricardo Soto  Procedure(s) Performed: Bilateral - Lumbar three-Lumbar four- laminectomy and foraminotomy (Bilateral: Back)  Patient Location: PACU  Anesthesia Type:General  Level of Consciousness: awake, oriented, and drowsy  Airway & Oxygen Therapy: Patient Spontanous Breathing  Post-op Assessment: Report given to RN, Post -op Vital signs reviewed and stable, and Patient moving all extremities  Post vital signs: stable  Last Vitals:  Vitals Value Taken Time  BP    Temp    Pulse    Resp    SpO2      Last Pain:  Vitals:   10/04/22 0612  TempSrc:   PainSc: 0-No pain         Complications: No notable events documented.

## 2022-10-04 NOTE — H&P (Signed)
Ricardo Soto is an 89 y.o. male.   Chief Complaint: Back and bilateral hip and leg pain neurogenic claudication HPI: 89 year old gentleman with progressive worsening back bilateral hip and leg pain neurogenic claudication workup revealed severe spinal stenosis at L3-4.  Due to patient's progression of clinical syndrome imaging findings and failed conservative treatment I recommended decompressive laminectomy bilaterally at L3-4 I extensively reviewed the risks and benefits of the operation with him as well as perioperative course expectations of outcome and alternatives of surgery and he understood and agreed to proceed forward.  Past Medical History:  Diagnosis Date   Acute pulmonary embolism (HCC) 03/14/2015   Arthritis    Osteo   BPH (benign prostatic hypertrophy)    s/p TURP   CAD (coronary artery disease)    a. 2006: s/p overlapping BMS-LAD 03/2005.otherwise had normal Cx and nonobstructive disease in RCA (30-40% multiple discrete lesions prox RCa, 50% mRCA). b. Neg nuc 2010.   Cancer (HCC)    skin cancer removed on face   CKD (chronic kidney disease), stage III (HCC)    DVT (deep venous thrombosis) (HCC) 03/14/2015   BLE DVT   Essential hypertension    History of kidney stones    HLD (hyperlipidemia)    Kidney stone    Neuromuscular disorder (HCC)    Left leg numbness with foot drop   Pneumonia     Past Surgical History:  Procedure Laterality Date   APPENDECTOMY  04/08/1936   CARDIAC CATHETERIZATION  04/08/2004   2 sents in LAD   CATARACT EXTRACTION  1/01 and '04   CORONARY ANGIOPLASTY     EYE SURGERY     CATARACT   HERNIA REPAIR  03/09/1999   HIP ARTHROPLASTY  04/08/2008   Left   JOINT REPLACEMENT     left hip; bilateral knee   KNEE ARTHROPLASTY  04/08/2013   Right   left knee total arthroplasty  11/25/2005   left nephrectomy  04/09/2003   at [facility] removed for begnin tumor   LUMBAR LAMINECTOMY/DECOMPRESSION MICRODISCECTOMY  12/05/2011   Procedure:  LUMBAR LAMINECTOMY/DECOMPRESSION MICRODISCECTOMY 1 LEVEL;  Surgeon: Mariam Dollar, MD;  Location: [facility];  Service: Neurosurgery;  Laterality: Bilateral;  lumbar four - five   LUMBAR LAMINECTOMY/DECOMPRESSION MICRODISCECTOMY  04/03/2012   Procedure: LUMBAR LAMINECTOMY/DECOMPRESSION MICRODISCECTOMY 1 LEVEL;  Surgeon: Mariam Dollar, MD;  Location: [facility];  Service: Neurosurgery;  Laterality: Left;  left lumbar four-five laminectony diskectomy   TRANSURETHRAL RESECTION OF PROSTATE  04/08/2002    Family History  Problem Relation Age of Onset   Heart attack Father    Heart disease Other        GP   Heart attack Other        GP    Social History:  reports that he quit smoking about 39 years ago. His smoking use included cigarettes. He has never used smokeless tobacco. He reports current alcohol use of about 3.0 standard drinks of alcohol per week. He reports that he does not use drugs.  Allergies: No Known Allergies  Medications Prior to Admission  Medication Sig Dispense Refill   acetaminophen (TYLENOL) 500 MG tablet Take 500 mg by mouth every 6 (six) hours as needed (pain).     apixaban (ELIQUIS) 5 MG TABS tablet Take 1 tablet (5 mg total) by mouth 2 (two) times daily. (Patient taking differently: Take 2.5 mg by mouth 2 (two) times daily.) 180 tablet 3   Cholecalciferol (VITAMIN D3) 50 MCG (2000 UT)  TABS Take 2,000 Units by mouth daily.     Coenzyme Q10-Fish Oil-Vit E (CO-Q 10 OMEGA-3 FISH OIL PO) Take 2 capsules by mouth daily.     folic acid (FOLVITE) 1 MG tablet Take 1 mg by mouth every evening.     metoprolol succinate (TOPROL-XL) 25 MG 24 hr tablet TAKE 1 TABLET EVERY DAY (Patient taking differently: Take 25 mg by mouth every evening.) 30 tablet 0   Multiple Vitamin (MULTIVITAMIN) tablet Take 1 tablet by mouth daily.     Multiple Vitamins-Minerals (PRESERVISION AREDS 2) CAPS Take 1 capsule by mouth daily.     rosuvastatin (CRESTOR) 10 MG tablet Take 1 tablet (10 mg total) by  mouth daily. (Patient taking differently: Take 10 mg by mouth every evening.) 90 tablet 2   Polyethyl Glycol-Propyl Glycol (SYSTANE OP) Place 1 drop into both eyes daily as needed (dry eyes).      No results found for this or any previous visit (from the past 48 hour(s)). No results found.  Review of Systems  Musculoskeletal:  Positive for gait problem.  Neurological:  Positive for numbness.    Blood pressure (!) 146/83, pulse 64, temperature (!) 97.5 F (36.4 C), temperature source Oral, resp. rate 18, height 5\' 11"  (1.803 m), weight 84.4 kg, SpO2 98 %. Physical Exam HENT:     Head: Normocephalic.     Right Ear: Tympanic membrane normal.     Nose: Nose normal.     Mouth/Throat:     Mouth: Mucous membranes are moist.  Eyes:     Pupils: Pupils are equal, round, and reactive to light.  Cardiovascular:     Rate and Rhythm: Normal rate.  Pulmonary:     Effort: Pulmonary effort is normal.  Abdominal:     General: Abdomen is flat.  Musculoskeletal:        General: Normal range of motion.     Cervical back: Normal range of motion.  Skin:    General: Skin is warm.  Neurological:     Mental Status: He is alert.     Comments: Strength is 5 of 5 iliopsoas, quads, hamstrings, gastroc, tibialis, and EHL.      Assessment/Plan 89 year old presents for decompressive laminectomy L3-4  Mariam Dollar, MD 10/04/2022, 7:29 AM

## 2022-10-05 ENCOUNTER — Encounter (HOSPITAL_COMMUNITY): Payer: Self-pay | Admitting: Neurosurgery

## 2022-10-05 DIAGNOSIS — M48062 Spinal stenosis, lumbar region with neurogenic claudication: Secondary | ICD-10-CM | POA: Diagnosis not present

## 2022-10-05 MED ORDER — CYCLOBENZAPRINE HCL 10 MG PO TABS: 10.0000 mg | ORAL_TABLET | Freq: Three times a day (TID) | ORAL | 0 refills | Status: AC | PRN

## 2022-10-05 NOTE — Evaluation (Signed)
Physical Therapy Evaluation Patient Details Name: Ricardo Soto MRN: [id number] DOB: 1931/09/20 Today's Date: 10/05/2022  History of Present Illness  89 year old male with progressive worsening back bilateral hip and leg pain. Workup revealed severe spinal stenosis at L3-4. L3-4 laminectomy and foraminotomy performed 10/04/22. PMHx: OA, CAD, BPH, HLD, CKD.  Clinical Impression  Pt presents to PT with deficits in strength, endurance, gait, balance, power. Pt is able to ambulate with household distances with support of RW. PT adjusts walker height to aide in improving posture. PT encourages frequent mobilization upon return home in an effort to improve strength and endurance. PT reiterates back precautions, previously provided by OT. PT recommends HHPT at the time of discharge.       Recommendations for follow up therapy are one component of a multi-disciplinary discharge planning process, led by the attending physician.  Recommendations may be updated based on patient status, additional functional criteria and insurance authorization.  Follow Up Recommendations       Assistance Recommended at Discharge PRN  Patient can return home with the following  A little help with bathing/dressing/bathroom;Assistance with cooking/housework;Assist for transportation    Equipment Recommendations None recommended by PT  Recommendations for Other Services       Functional Status Assessment Patient has had a recent decline in their functional status and demonstrates the ability to make significant improvements in function in a reasonable and predictable amount of time.     Precautions / Restrictions Precautions Precautions: Back;Fall Precaution Booklet Issued: Yes (comment) Precaution Comments: back precaution handout provided Required Braces or Orthoses: Other Brace (none) Other Brace: no brace required Restrictions Weight Bearing Restrictions: No      Mobility  Bed Mobility                General bed mobility comments: verbally review technique, pt declines bed mobility training at this time    Transfers Overall transfer level: Needs assistance Equipment used: Rolling walker (2 wheels) Transfers: Sit to/from Stand Sit to Stand: Supervision                Ambulation/Gait Ambulation/Gait assistance: Supervision Gait Distance (Feet): 200 Feet Assistive device: Rolling walker (2 wheels) Gait Pattern/deviations: Step-through pattern, Trunk flexed Gait velocity: reduced Gait velocity interpretation: <1.8 ft/sec, indicate of risk for recurrent falls   General Gait Details: PT initially adjusts walker height to improve posture, step-through gait, 2 brief standing rest breaks  Stairs            Wheelchair Mobility    Modified Rankin (Stroke Patients Only)       Balance Overall balance assessment: Needs assistance Sitting-balance support: No upper extremity supported, Feet supported Sitting balance-Leahy Scale: Good     Standing balance support: Single extremity supported, Reliant on assistive device for balance Standing balance-Leahy Scale: Poor                               Pertinent Vitals/Pain Pain Assessment Pain Assessment: Faces Faces Pain Scale: Hurts little more Pain Location: back Pain Descriptors / Indicators: Sore Pain Intervention(s): Monitored during session    Home Living Family/patient expects to be discharged to:: Assisted living                 Home Equipment: Agricultural consultant (2 wheels);Rollator (4 wheels);Cane - single point;Grab bars - toilet;Grab bars - tub/shower;Shower seat - built in Additional Comments: Level entry, no stairs    Prior Function Prior Level  of Function : Independent/Modified Independent;Driving;History of Falls (last six months)             Mobility Comments: mod I with use of SPC inside, uses rollator outside. Reports 1 fall while seated on rollator with brakes  unlocked ADLs Comments: Mod I with ADL/IADLs and driving. Wears L AFO at baseline for footdrop and requires increased time for LB dressing     Hand Dominance   Dominant Hand: Right    Extremity/Trunk Assessment   Upper Extremity Assessment Upper Extremity Assessment: Overall WFL for tasks assessed    Lower Extremity Assessment Lower Extremity Assessment: Generalized weakness    Cervical / Trunk Assessment Cervical / Trunk Assessment: Kyphotic  Communication   Communication: No difficulties  Cognition Arousal/Alertness: Awake/alert Behavior During Therapy: WFL for tasks assessed/performed Overall Cognitive Status: Within Functional Limits for tasks assessed                                          General Comments General comments (skin integrity, edema, etc.): VSS on RA    Exercises     Assessment/Plan    PT Assessment Patient needs continued PT services  PT Problem List Decreased strength;Decreased activity tolerance;Decreased balance;Decreased mobility;Decreased knowledge of use of DME;Decreased knowledge of precautions       PT Treatment Interventions DME instruction;Gait training;Functional mobility training;Therapeutic activities;Balance training;Therapeutic exercise;Neuromuscular re-education;Patient/family education    PT Goals (Current goals can be found in the Care Plan section)  Acute Rehab PT Goals Patient Stated Goal: to return home, improve activity tolerance PT Goal Formulation: With patient Time For Goal Achievement: 10/10/22 Potential to Achieve Goals: Good    Frequency Min 5X/week     Co-evaluation               AM-PAC PT "6 Clicks" Mobility  Outcome Measure Help needed turning from your back to your side while in a flat bed without using bedrails?: A Little Help needed moving from lying on your back to sitting on the side of a flat bed without using bedrails?: A Little Help needed moving to and from a bed to a chair  (including a wheelchair)?: A Little Help needed standing up from a chair using your arms (e.g., wheelchair or bedside chair)?: A Little Help needed to walk in hospital room?: A Little Help needed climbing 3-5 steps with a railing? : A Little 6 Click Score: 18    End of Session   Activity Tolerance: Patient tolerated treatment well Patient left: in bed;with call bell/phone within reach Nurse Communication: Mobility status PT Visit Diagnosis: Other abnormalities of gait and mobility (R26.89);Muscle weakness (generalized) (M62.81)    Time: 4098-1191 PT Time Calculation (min) (ACUTE ONLY): 24 min   Charges:   PT Evaluation $PT Eval Low Complexity: 1 Low          Arlyss Gandy, PT, DPT Acute Rehabilitation Office [phone]   Arlyss Gandy 10/05/2022, 9:56 AM

## 2022-10-05 NOTE — Discharge Summary (Signed)
Physician Discharge Summary  Patient ID: Ricardo Soto MRN: [id number] DOB/AGE: 1931-09-10 89 y.o.  Admit date: 10/04/2022 Discharge date: 10/05/2022  Admission Diagnoses: Lumbar spinal stenosis L3-4 bilateral L3-L4 radiculopathies with neurogenic claudication.     Discharge Diagnoses: same   Discharged Condition: good  Hospital Course: The patient was admitted on 10/04/2022 and taken to the operating room where the patient underwent lumbar L3-4 laminectomy. The patient tolerated the procedure well and was taken to the recovery room and then to the floor in stable condition. The hospital course was routine. There were no complications. The wound remained clean dry and intact. Pt had appropriate back soreness. No complaints of leg pain or new N/T/W. The patient remained afebrile with stable vital signs, and tolerated a regular diet. The patient continued to increase activities, and pain was well controlled with oral pain medications.   Consults: None  Significant Diagnostic Studies:  Results for orders placed or performed during the hospital encounter of 09/25/22  Surgical pcr screen   Specimen: Nasal Mucosa; Nasal Swab  Result Value Ref Range   MRSA, PCR NEGATIVE NEGATIVE   Staphylococcus aureus NEGATIVE NEGATIVE  Basic metabolic panel per protocol  Result Value Ref Range   Sodium 138 135 - 145 mmol/L   Potassium 4.6 3.5 - 5.1 mmol/L   Chloride 102 98 - 111 mmol/L   CO2 26 22 - 32 mmol/L   Glucose, Bld 92 70 - 99 mg/dL   BUN 29 (H) 8 - 23 mg/dL   Creatinine, Ser 4.09 (H) 0.61 - 1.24 mg/dL   Calcium 81.1 8.9 - 91.4 mg/dL   GFR, Estimated 47 (L) >60 mL/min   Anion gap 10 5 - 15  CBC per protocol  Result Value Ref Range   WBC 10.1 4.0 - 10.5 K/uL   RBC 4.38 4.22 - 5.81 MIL/uL   Hemoglobin 14.2 13.0 - 17.0 g/dL   HCT 78.2 95.6 - 21.3 %   MCV 101.1 (H) 80.0 - 100.0 fL   MCH 32.4 26.0 - 34.0 pg   MCHC 32.1 30.0 - 36.0 g/dL   RDW 08.6 57.8 - 46.9 %   Platelets 220 150 -  400 K/uL   nRBC 0.0 0.0 - 0.2 %    DG Lumbar Spine 1 View  Result Date: 10/04/2022 CLINICAL DATA:  Intraoperative imaging for localization. EXAM: LUMBAR SPINE - 1 VIEW COMPARISON:  03/15/2022 FINDINGS: Single lateral intraoperative view of the lumbar spine demonstrates surgical instrument from posterior approach with tip over the L4 posterior elements. Vertebral body heights and alignment are normal. Moderate spondylosis throughout the lumbar spine. Disc space narrowing at the L4-5 level. Remainder of the exam is unchanged. IMPRESSION: Surgical instrument from posterior approach with tip over the L4 posterior elements. Electronically Signed   By: Elberta Fortis M.D.   On: 10/04/2022 10:46    Antibiotics:  Anti-infectives (From admission, onward)    Start     Dose/Rate Route Frequency Ordered Stop   10/04/22 1600  ceFAZolin (ANCEF) IVPB 2g/100 mL premix        2 g 200 mL/hr over 30 Minutes Intravenous Every 8 hours 10/04/22 1118 10/04/22 2200   10/04/22 0600  ceFAZolin (ANCEF) IVPB 2g/100 mL premix        2 g 200 mL/hr over 30 Minutes Intravenous On call to O.R. 10/04/22 0555 10/04/22 0752       Discharge Exam: Blood pressure (!) 118/59, pulse 77, temperature 99.9 F (37.7 C), temperature source Oral, resp. rate 18, height  5\' 11"  (1.803 m), weight 84.4 kg, SpO2 97 %. Neurologic: Grossly normal Ambulating and voiding well incision cdi   Discharge Medications:   Allergies as of 10/05/2022   No Known Allergies      Medication List     STOP taking these medications    apixaban 5 MG Tabs tablet Commonly known as: Eliquis       TAKE these medications    acetaminophen 500 MG tablet Commonly known as: TYLENOL Take 500 mg by mouth every 6 (six) hours as needed (pain).   CO-Q 10 OMEGA-3 FISH OIL PO Take 2 capsules by mouth daily.   cyclobenzaprine 10 MG tablet Commonly known as: FLEXERIL Take 1 tablet (10 mg total) by mouth 3 (three) times daily as needed for muscle  spasms.   folic acid 1 MG tablet Commonly known as: FOLVITE Take 1 mg by mouth every evening.   metoprolol succinate 25 MG 24 hr tablet Commonly known as: TOPROL-XL TAKE 1 TABLET EVERY DAY What changed: when to take this   multivitamin tablet Take 1 tablet by mouth daily.   PreserVision AREDS 2 Caps Take 1 capsule by mouth daily.   rosuvastatin 10 MG tablet Commonly known as: CRESTOR Take 1 tablet (10 mg total) by mouth daily. What changed: when to take this   SYSTANE OP Place 1 drop into both eyes daily as needed (dry eyes).   Vitamin D3 50 MCG (2000 UT) Tabs Take 2,000 Units by mouth daily.        Disposition: home   Final Dx: lumbar L3-4 laminectomy  Discharge Instructions      Remove dressing in 72 hours   Complete by: As directed    Call MD for:  difficulty breathing, headache or visual disturbances   Complete by: As directed    Call MD for:  hives   Complete by: As directed    Call MD for:  persistant nausea and vomiting   Complete by: As directed    Call MD for:  redness, tenderness, or signs of infection (pain, swelling, redness, odor or green/yellow discharge around incision site)   Complete by: As directed    Call MD for:  severe uncontrolled pain   Complete by: As directed    Call MD for:  temperature >100.4   Complete by: As directed    Diet - low sodium heart healthy   Complete by: As directed    Driving Restrictions   Complete by: As directed    No driving for 2 weeks, no riding in the car for 1 week   Increase activity slowly   Complete by: As directed    Lifting restrictions   Complete by: As directed    No lifting more than 8 lbs        Follow-up Information     Donalee Citrin, MD. Call.   Specialty: Neurosurgery Why: As needed, If symptoms worsen Contact information: [address] [place] [address] [phone]                  Signed: Tiana Loft [facility] 10/05/2022, 9:02 AM

## 2022-10-05 NOTE — Care Management (Addendum)
Patient with order to DC to home today. Unit staff to provide DME needed for home.   Patient assessed by NP, no orders for [facility] therapy, will follow up in office in 1 week.   Patient will have family/ friends provide transportation home. No other TOC needs identified for DC

## 2022-10-05 NOTE — Progress Notes (Signed)
Patient alert and oriented, mae's well, voiding adequate amount of urine, swallowing without difficulty, no c/o pain at time of discharge. Patient discharged home with family. Script and discharged instructions given to patient. Patient and family stated understanding of instructions given. Patient has an appointment with Dr. Dawley °

## 2022-10-05 NOTE — TOC Transition Note (Signed)
Transition of Care [facility]) - CM/SW Discharge Note   Patient Details  Name: Ricardo Soto MRN: [id number] Date of Birth: 10/15/1931  Transition of Care [facility]) CM/SW Contact:  Ronny Bacon, RN Phone Number: 10/05/2022, 10:16 AM   Clinical Narrative:   Patient is being discharged home today, son will transport patient home. HH PT/OT options given to patient, patient did not have a preference. [facility] able to accept patient for [facility] PT/OT services and is aware patient is being discharged home today.     Final next level of care: Home w Home Health Services Barriers to Discharge: No Barriers Identified   Patient Goals and CMS Choice      Discharge Placement                         Discharge Plan and Services Additional resources added to the After Visit Summary for                            HH Arranged: PT, OT HH Agency: [facility] Date [facility] Agency Contacted: 10/05/22 Time HH Agency Contacted: 1009 Representative spoke with at [facility] Agency: Kandee Keen  Social Determinants of Health (SDOH) Interventions SDOH Screenings   Tobacco Use: Medium Risk (10/05/2022)     Readmission Risk Interventions     No data to display

## 2022-10-05 NOTE — Evaluation (Signed)
Occupational Therapy Evaluation Patient Details Name: Ricardo Soto MRN: [id number] DOB: February 22, 1932 Today's Date: 10/05/2022   History of Present Illness 89 year old male with progressive worsening back bilateral hip and leg pain. Workup revealed severe spinal stenosis at L3-4. L3-4 laminectomy and foraminotomy performed 10/04/22. PMHx: OA, CAD, BPH, HLD, CKD.   Clinical Impression   Pt evaluated s/p above admission list. Pt resides at [facility] and reports modified independence with ADL/IADLs, driving and functional mobility with use of SPC inside and rollator for outside at baseline. Pt presents this session with decreased balance, generalized weakness, and baseline left foot drop with L AFO at baseline. Pt educated and provided with back precaution handout with pt verbalizing understanding. However, pt required increased cues to maintain back precautions during LB dressing. Pt educated on and demonstrated use of reacher and sock aid to complete LB dressing. Pt currently requires setup A for seated UB ADLs and mod-max A for LB ADLs. Pt would benefit from continued acute OT services to maximize functional independence and facilitate transition to pt's natural home environment with [facility] OT services upon discharge.       Recommendations for follow up therapy are one component of a multi-disciplinary discharge planning process, led by the attending physician.  Recommendations may be updated based on patient status, additional functional criteria and insurance authorization.   Assistance Recommended at Discharge Intermittent Supervision/Assistance  Patient can return home with the following A little help with walking and/or transfers;A little help with bathing/dressing/bathroom;Assistance with cooking/housework;Assist for transportation;Help with stairs or ramp for entrance    Functional Status Assessment  Patient has had a recent decline in their functional status and demonstrates the ability to make  significant improvements in function in a reasonable and predictable amount of time.  Equipment Recommendations  None recommended by OT    Recommendations for Other Services PT consult     Precautions / Restrictions Precautions Precautions: Back;Fall Precaution Booklet Issued: Yes (comment) Precaution Comments: back precaution handout provided Required Braces or Orthoses: Other Brace (none) Other Brace: no brace required Restrictions Weight Bearing Restrictions: No      Mobility Bed Mobility               General bed mobility comments: Not assessed, pt EOB upon arrival.    Transfers Overall transfer level: Needs assistance Equipment used: Rolling walker (2 wheels) Transfers: Sit to/from Stand Sit to Stand: Min guard           General transfer comment: STS transfers x2 trials for LB dressing using RW with min guard A.      Balance Overall balance assessment: Needs assistance Sitting-balance support: No upper extremity supported, Feet supported Sitting balance-Leahy Scale: Fair Sitting balance - Comments: sitting EOB   Standing balance support: Bilateral upper extremity supported, Reliant on assistive device for balance Standing balance-Leahy Scale: Poor Standing balance comment: BUE support on RW for stability, able to offload single UE for LB dressing with min guard A.                           ADL either performed or assessed with clinical judgement   ADL Overall ADL's : Needs assistance/impaired Eating/Feeding: Modified independent;Sitting   Grooming: Set up;Sitting   Upper Body Bathing: Set up;Sitting   Lower Body Bathing: Moderate assistance;Sit to/from stand   Upper Body Dressing : Set up;Sitting Upper Body Dressing Details (indicate cue type and reason): Pt donned shirt at EOB with setup A. Lower Body  Dressing: Maximal assistance;Moderate assistance;Sit to/from stand Lower Body Dressing Details (indicate cue type and reason): Pt  unable to acheive figure four position; pt doffed bilat socks at EOB using reacher with supervision and mod verbal cues for technique. Pt donned bilat socks at EOB using sock aid with mod A and mod cues for technique. Pt donned underwear/pants while alternating sitting/standing EOB using reacher with min A and min cues for technique. Pt required max A to don bilat tennis shoes and L AFO at EOB. Toilet Transfer: Min guard;Comfort height toilet;Ambulation;Rolling walker (2 wheels) Toilet Transfer Details (indicate cue type and reason): simulated Toileting- Clothing Manipulation and Hygiene: Min guard;Sit to/from stand       Functional mobility during ADLs: Min guard;Rolling walker (2 wheels) General ADL Comments: Required cues throughout to maintain back precautions with dressing; min guard A for stability during standing ADLs     Vision Baseline Vision/History: 1 Wears glasses Ability to See in Adequate Light: 0 Adequate Vision Assessment?: No apparent visual deficits     Perception Perception Perception Tested?: No   Praxis Praxis Praxis tested?: Not tested    Pertinent Vitals/Pain Pain Assessment Pain Assessment: Faces Faces Pain Scale: Hurts little more Pain Location: back Pain Descriptors / Indicators: Discomfort, Guarding Pain Intervention(s): Monitored during session, Limited activity within patient's tolerance     Hand Dominance Right   Extremity/Trunk Assessment Upper Extremity Assessment Upper Extremity Assessment: Overall WFL for tasks assessed (history of neuropathy)   Lower Extremity Assessment Lower Extremity Assessment: Defer to PT evaluation   Cervical / Trunk Assessment Cervical / Trunk Assessment: Kyphotic   Communication Communication Communication: No difficulties   Cognition Arousal/Alertness: Awake/alert Behavior During Therapy: WFL for tasks assessed/performed Overall Cognitive Status: Within Functional Limits for tasks assessed                                  General Comments: Pleasant and cooperative, self-distracting. Required frequent cues for maintaining back precautions with LB dressing.     General Comments  VSS on RA    Exercises     Shoulder Instructions      Home Living Family/patient expects to be discharged to:: Assisted living                             Home Equipment: Rolling Walker (2 wheels);Rollator (4 wheels);Cane - single point;Grab bars - toilet;Grab bars - tub/shower;Shower seat - built in   Additional Comments: Level entry, no stairs      Prior Functioning/Environment Prior Level of Function : Independent/Modified Independent;Driving;History of Falls (last six months)             Mobility Comments: mod I with use of SPC inside, uses rollator outside. Reports 1 fall while seated on rollator with brakes unlocked ADLs Comments: Mod I with ADL/IADLs and driving. Wears L AFO at baseline for footdrop and requires increased time for LB dressing        OT Problem List: Decreased strength;Decreased range of motion;Impaired balance (sitting and/or standing);Decreased knowledge of precautions;Pain      OT Treatment/Interventions: Self-care/ADL training;Energy conservation;DME and/or AE instruction;Therapeutic activities;Patient/family education;Balance training    OT Goals(Current goals can be found in the care plan section) Acute Rehab OT Goals Patient Stated Goal: to go home OT Goal Formulation: With patient Time For Goal Achievement: 10/19/22 Potential to Achieve Goals: Good ADL Goals Pt Will Perform Grooming:  with modified independence;standing Pt Will Perform Lower Body Dressing: with modified independence;sit to/from stand;with adaptive equipment Pt Will Transfer to Toilet: with modified independence;regular height toilet;ambulating Pt Will Perform Toileting - Clothing Manipulation and hygiene: with modified independence;sit to/from stand Additional ADL Goal #1: Pt  will independently recall 3/3 BLT back precautions to increase safety during ADLs  OT Frequency: Min 2X/week    Co-evaluation              AM-PAC OT "6 Clicks" Daily Activity     Outcome Measure Help from another person eating meals?: None Help from another person taking care of personal grooming?: A Little Help from another person toileting, which includes using toliet, bedpan, or urinal?: A Little Help from another person bathing (including washing, rinsing, drying)?: A Lot Help from another person to put on and taking off regular upper body clothing?: A Little Help from another person to put on and taking off regular lower body clothing?: A Lot 6 Click Score: 17   End of Session Equipment Utilized During Treatment: Rolling walker (2 wheels) Nurse Communication: Mobility status  Activity Tolerance: Patient tolerated treatment well Patient left: in bed;with call bell/phone within reach  OT Visit Diagnosis: Unsteadiness on feet (R26.81);History of falling (Z91.81);Muscle weakness (generalized) (M62.81);Pain                Time: 1610-9604 OT Time Calculation (min): 27 min Charges:  OT General Charges $OT Visit: 1 Visit OT Evaluation $OT Eval Moderate Complexity: 1 Mod OT Treatments $Self Care/Home Management : 8-22 mins  Sherley Bounds, OTS Acute Rehabilitation Services Office [phone] Secure Chat Communication Preferred   Sherley Bounds 10/05/2022, 9:54 AM

## 2022-10-09 DIAGNOSIS — Z4789 Encounter for other orthopedic aftercare: Secondary | ICD-10-CM | POA: Diagnosis not present

## 2022-10-09 DIAGNOSIS — M48062 Spinal stenosis, lumbar region with neurogenic claudication: Secondary | ICD-10-CM | POA: Diagnosis not present

## 2022-10-09 DIAGNOSIS — Z9181 History of falling: Secondary | ICD-10-CM | POA: Diagnosis not present

## 2022-10-09 DIAGNOSIS — M21372 Foot drop, left foot: Secondary | ICD-10-CM | POA: Diagnosis not present

## 2022-10-09 DIAGNOSIS — Z7901 Long term (current) use of anticoagulants: Secondary | ICD-10-CM | POA: Diagnosis not present

## 2022-10-09 DIAGNOSIS — M5416 Radiculopathy, lumbar region: Secondary | ICD-10-CM | POA: Diagnosis not present

## 2022-10-24 DIAGNOSIS — H353211 Exudative age-related macular degeneration, right eye, with active choroidal neovascularization: Secondary | ICD-10-CM | POA: Diagnosis not present

## 2022-12-05 DIAGNOSIS — R2689 Other abnormalities of gait and mobility: Secondary | ICD-10-CM | POA: Diagnosis not present

## 2022-12-05 DIAGNOSIS — M48062 Spinal stenosis, lumbar region with neurogenic claudication: Secondary | ICD-10-CM | POA: Diagnosis not present

## 2022-12-05 DIAGNOSIS — M6281 Muscle weakness (generalized): Secondary | ICD-10-CM | POA: Diagnosis not present

## 2022-12-11 DIAGNOSIS — M48062 Spinal stenosis, lumbar region with neurogenic claudication: Secondary | ICD-10-CM | POA: Diagnosis not present

## 2022-12-11 DIAGNOSIS — M6281 Muscle weakness (generalized): Secondary | ICD-10-CM | POA: Diagnosis not present

## 2022-12-11 DIAGNOSIS — R2689 Other abnormalities of gait and mobility: Secondary | ICD-10-CM | POA: Diagnosis not present

## 2022-12-13 DIAGNOSIS — M6281 Muscle weakness (generalized): Secondary | ICD-10-CM | POA: Diagnosis not present

## 2022-12-13 DIAGNOSIS — R2689 Other abnormalities of gait and mobility: Secondary | ICD-10-CM | POA: Diagnosis not present

## 2022-12-13 DIAGNOSIS — M48062 Spinal stenosis, lumbar region with neurogenic claudication: Secondary | ICD-10-CM | POA: Diagnosis not present

## 2022-12-17 DIAGNOSIS — R2689 Other abnormalities of gait and mobility: Secondary | ICD-10-CM | POA: Diagnosis not present

## 2022-12-17 DIAGNOSIS — M6281 Muscle weakness (generalized): Secondary | ICD-10-CM | POA: Diagnosis not present

## 2022-12-17 DIAGNOSIS — M48062 Spinal stenosis, lumbar region with neurogenic claudication: Secondary | ICD-10-CM | POA: Diagnosis not present

## 2022-12-19 DIAGNOSIS — H353211 Exudative age-related macular degeneration, right eye, with active choroidal neovascularization: Secondary | ICD-10-CM | POA: Diagnosis not present

## 2022-12-20 DIAGNOSIS — M6281 Muscle weakness (generalized): Secondary | ICD-10-CM | POA: Diagnosis not present

## 2022-12-20 DIAGNOSIS — R2689 Other abnormalities of gait and mobility: Secondary | ICD-10-CM | POA: Diagnosis not present

## 2022-12-20 DIAGNOSIS — M48062 Spinal stenosis, lumbar region with neurogenic claudication: Secondary | ICD-10-CM | POA: Diagnosis not present

## 2022-12-24 DIAGNOSIS — M6281 Muscle weakness (generalized): Secondary | ICD-10-CM | POA: Diagnosis not present

## 2022-12-24 DIAGNOSIS — R2689 Other abnormalities of gait and mobility: Secondary | ICD-10-CM | POA: Diagnosis not present

## 2022-12-24 DIAGNOSIS — M48062 Spinal stenosis, lumbar region with neurogenic claudication: Secondary | ICD-10-CM | POA: Diagnosis not present

## 2022-12-27 DIAGNOSIS — R2689 Other abnormalities of gait and mobility: Secondary | ICD-10-CM | POA: Diagnosis not present

## 2022-12-27 DIAGNOSIS — M6281 Muscle weakness (generalized): Secondary | ICD-10-CM | POA: Diagnosis not present

## 2022-12-27 DIAGNOSIS — M48062 Spinal stenosis, lumbar region with neurogenic claudication: Secondary | ICD-10-CM | POA: Diagnosis not present

## 2022-12-31 DIAGNOSIS — R2689 Other abnormalities of gait and mobility: Secondary | ICD-10-CM | POA: Diagnosis not present

## 2022-12-31 DIAGNOSIS — M48062 Spinal stenosis, lumbar region with neurogenic claudication: Secondary | ICD-10-CM | POA: Diagnosis not present

## 2022-12-31 DIAGNOSIS — M6281 Muscle weakness (generalized): Secondary | ICD-10-CM | POA: Diagnosis not present

## 2023-01-02 DIAGNOSIS — M48062 Spinal stenosis, lumbar region with neurogenic claudication: Secondary | ICD-10-CM | POA: Diagnosis not present

## 2023-01-02 DIAGNOSIS — R2689 Other abnormalities of gait and mobility: Secondary | ICD-10-CM | POA: Diagnosis not present

## 2023-01-02 DIAGNOSIS — M6281 Muscle weakness (generalized): Secondary | ICD-10-CM | POA: Diagnosis not present

## 2023-01-07 DIAGNOSIS — R2689 Other abnormalities of gait and mobility: Secondary | ICD-10-CM | POA: Diagnosis not present

## 2023-01-07 DIAGNOSIS — M48062 Spinal stenosis, lumbar region with neurogenic claudication: Secondary | ICD-10-CM | POA: Diagnosis not present

## 2023-01-07 DIAGNOSIS — M6281 Muscle weakness (generalized): Secondary | ICD-10-CM | POA: Diagnosis not present

## 2023-01-10 DIAGNOSIS — M48062 Spinal stenosis, lumbar region with neurogenic claudication: Secondary | ICD-10-CM | POA: Diagnosis not present

## 2023-01-10 DIAGNOSIS — R2689 Other abnormalities of gait and mobility: Secondary | ICD-10-CM | POA: Diagnosis not present

## 2023-01-10 DIAGNOSIS — M6281 Muscle weakness (generalized): Secondary | ICD-10-CM | POA: Diagnosis not present

## 2023-01-14 DIAGNOSIS — M6281 Muscle weakness (generalized): Secondary | ICD-10-CM | POA: Diagnosis not present

## 2023-01-14 DIAGNOSIS — R2689 Other abnormalities of gait and mobility: Secondary | ICD-10-CM | POA: Diagnosis not present

## 2023-01-14 DIAGNOSIS — M48062 Spinal stenosis, lumbar region with neurogenic claudication: Secondary | ICD-10-CM | POA: Diagnosis not present

## 2023-01-15 DIAGNOSIS — Z23 Encounter for immunization: Secondary | ICD-10-CM | POA: Diagnosis not present

## 2023-01-17 DIAGNOSIS — M6281 Muscle weakness (generalized): Secondary | ICD-10-CM | POA: Diagnosis not present

## 2023-01-17 DIAGNOSIS — M48062 Spinal stenosis, lumbar region with neurogenic claudication: Secondary | ICD-10-CM | POA: Diagnosis not present

## 2023-01-17 DIAGNOSIS — R2689 Other abnormalities of gait and mobility: Secondary | ICD-10-CM | POA: Diagnosis not present

## 2023-01-22 DIAGNOSIS — M48062 Spinal stenosis, lumbar region with neurogenic claudication: Secondary | ICD-10-CM | POA: Diagnosis not present

## 2023-01-22 DIAGNOSIS — M6281 Muscle weakness (generalized): Secondary | ICD-10-CM | POA: Diagnosis not present

## 2023-01-22 DIAGNOSIS — R2689 Other abnormalities of gait and mobility: Secondary | ICD-10-CM | POA: Diagnosis not present

## 2023-01-24 DIAGNOSIS — M48062 Spinal stenosis, lumbar region with neurogenic claudication: Secondary | ICD-10-CM | POA: Diagnosis not present

## 2023-01-24 DIAGNOSIS — M6281 Muscle weakness (generalized): Secondary | ICD-10-CM | POA: Diagnosis not present

## 2023-01-24 DIAGNOSIS — R2689 Other abnormalities of gait and mobility: Secondary | ICD-10-CM | POA: Diagnosis not present

## 2023-01-28 DIAGNOSIS — M48062 Spinal stenosis, lumbar region with neurogenic claudication: Secondary | ICD-10-CM | POA: Diagnosis not present

## 2023-01-28 DIAGNOSIS — R2689 Other abnormalities of gait and mobility: Secondary | ICD-10-CM | POA: Diagnosis not present

## 2023-01-28 DIAGNOSIS — M6281 Muscle weakness (generalized): Secondary | ICD-10-CM | POA: Diagnosis not present

## 2023-01-31 DIAGNOSIS — R2689 Other abnormalities of gait and mobility: Secondary | ICD-10-CM | POA: Diagnosis not present

## 2023-01-31 DIAGNOSIS — M6281 Muscle weakness (generalized): Secondary | ICD-10-CM | POA: Diagnosis not present

## 2023-01-31 DIAGNOSIS — M48062 Spinal stenosis, lumbar region with neurogenic claudication: Secondary | ICD-10-CM | POA: Diagnosis not present

## 2023-02-04 DIAGNOSIS — M48062 Spinal stenosis, lumbar region with neurogenic claudication: Secondary | ICD-10-CM | POA: Diagnosis not present

## 2023-02-04 DIAGNOSIS — M6281 Muscle weakness (generalized): Secondary | ICD-10-CM | POA: Diagnosis not present

## 2023-02-04 DIAGNOSIS — R2689 Other abnormalities of gait and mobility: Secondary | ICD-10-CM | POA: Diagnosis not present

## 2023-02-07 DIAGNOSIS — R2689 Other abnormalities of gait and mobility: Secondary | ICD-10-CM | POA: Diagnosis not present

## 2023-02-07 DIAGNOSIS — M48062 Spinal stenosis, lumbar region with neurogenic claudication: Secondary | ICD-10-CM | POA: Diagnosis not present

## 2023-02-07 DIAGNOSIS — M6281 Muscle weakness (generalized): Secondary | ICD-10-CM | POA: Diagnosis not present

## 2023-02-11 DIAGNOSIS — M6281 Muscle weakness (generalized): Secondary | ICD-10-CM | POA: Diagnosis not present

## 2023-02-11 DIAGNOSIS — M48062 Spinal stenosis, lumbar region with neurogenic claudication: Secondary | ICD-10-CM | POA: Diagnosis not present

## 2023-02-11 DIAGNOSIS — R2689 Other abnormalities of gait and mobility: Secondary | ICD-10-CM | POA: Diagnosis not present

## 2023-02-14 DIAGNOSIS — M48062 Spinal stenosis, lumbar region with neurogenic claudication: Secondary | ICD-10-CM | POA: Diagnosis not present

## 2023-02-14 DIAGNOSIS — R2689 Other abnormalities of gait and mobility: Secondary | ICD-10-CM | POA: Diagnosis not present

## 2023-02-14 DIAGNOSIS — M6281 Muscle weakness (generalized): Secondary | ICD-10-CM | POA: Diagnosis not present

## 2023-02-17 DIAGNOSIS — M48062 Spinal stenosis, lumbar region with neurogenic claudication: Secondary | ICD-10-CM | POA: Diagnosis not present

## 2023-02-17 DIAGNOSIS — R2689 Other abnormalities of gait and mobility: Secondary | ICD-10-CM | POA: Diagnosis not present

## 2023-02-17 DIAGNOSIS — M6281 Muscle weakness (generalized): Secondary | ICD-10-CM | POA: Diagnosis not present

## 2023-02-20 DIAGNOSIS — H353211 Exudative age-related macular degeneration, right eye, with active choroidal neovascularization: Secondary | ICD-10-CM | POA: Diagnosis not present

## 2023-02-21 DIAGNOSIS — R2689 Other abnormalities of gait and mobility: Secondary | ICD-10-CM | POA: Diagnosis not present

## 2023-02-21 DIAGNOSIS — M6281 Muscle weakness (generalized): Secondary | ICD-10-CM | POA: Diagnosis not present

## 2023-02-21 DIAGNOSIS — M48062 Spinal stenosis, lumbar region with neurogenic claudication: Secondary | ICD-10-CM | POA: Diagnosis not present

## 2023-02-25 DIAGNOSIS — M48062 Spinal stenosis, lumbar region with neurogenic claudication: Secondary | ICD-10-CM | POA: Diagnosis not present

## 2023-02-25 DIAGNOSIS — M6281 Muscle weakness (generalized): Secondary | ICD-10-CM | POA: Diagnosis not present

## 2023-02-25 DIAGNOSIS — R2689 Other abnormalities of gait and mobility: Secondary | ICD-10-CM | POA: Diagnosis not present

## 2023-02-28 DIAGNOSIS — R2689 Other abnormalities of gait and mobility: Secondary | ICD-10-CM | POA: Diagnosis not present

## 2023-02-28 DIAGNOSIS — M6281 Muscle weakness (generalized): Secondary | ICD-10-CM | POA: Diagnosis not present

## 2023-02-28 DIAGNOSIS — M48062 Spinal stenosis, lumbar region with neurogenic claudication: Secondary | ICD-10-CM | POA: Diagnosis not present

## 2023-03-04 DIAGNOSIS — M6281 Muscle weakness (generalized): Secondary | ICD-10-CM | POA: Diagnosis not present

## 2023-03-04 DIAGNOSIS — M48062 Spinal stenosis, lumbar region with neurogenic claudication: Secondary | ICD-10-CM | POA: Diagnosis not present

## 2023-03-04 DIAGNOSIS — R2689 Other abnormalities of gait and mobility: Secondary | ICD-10-CM | POA: Diagnosis not present

## 2023-03-11 DIAGNOSIS — R2689 Other abnormalities of gait and mobility: Secondary | ICD-10-CM | POA: Diagnosis not present

## 2023-03-11 DIAGNOSIS — M6281 Muscle weakness (generalized): Secondary | ICD-10-CM | POA: Diagnosis not present

## 2023-03-11 DIAGNOSIS — M48062 Spinal stenosis, lumbar region with neurogenic claudication: Secondary | ICD-10-CM | POA: Diagnosis not present

## 2023-03-13 DIAGNOSIS — M6281 Muscle weakness (generalized): Secondary | ICD-10-CM | POA: Diagnosis not present

## 2023-03-13 DIAGNOSIS — M48062 Spinal stenosis, lumbar region with neurogenic claudication: Secondary | ICD-10-CM | POA: Diagnosis not present

## 2023-03-13 DIAGNOSIS — R2689 Other abnormalities of gait and mobility: Secondary | ICD-10-CM | POA: Diagnosis not present

## 2023-03-18 DIAGNOSIS — R2689 Other abnormalities of gait and mobility: Secondary | ICD-10-CM | POA: Diagnosis not present

## 2023-03-18 DIAGNOSIS — M6281 Muscle weakness (generalized): Secondary | ICD-10-CM | POA: Diagnosis not present

## 2023-03-18 DIAGNOSIS — M48062 Spinal stenosis, lumbar region with neurogenic claudication: Secondary | ICD-10-CM | POA: Diagnosis not present

## 2023-03-21 DIAGNOSIS — R2689 Other abnormalities of gait and mobility: Secondary | ICD-10-CM | POA: Diagnosis not present

## 2023-03-21 DIAGNOSIS — M48062 Spinal stenosis, lumbar region with neurogenic claudication: Secondary | ICD-10-CM | POA: Diagnosis not present

## 2023-03-21 DIAGNOSIS — M6281 Muscle weakness (generalized): Secondary | ICD-10-CM | POA: Diagnosis not present

## 2023-03-25 DIAGNOSIS — R2689 Other abnormalities of gait and mobility: Secondary | ICD-10-CM | POA: Diagnosis not present

## 2023-03-25 DIAGNOSIS — M6281 Muscle weakness (generalized): Secondary | ICD-10-CM | POA: Diagnosis not present

## 2023-03-25 DIAGNOSIS — M48062 Spinal stenosis, lumbar region with neurogenic claudication: Secondary | ICD-10-CM | POA: Diagnosis not present

## 2023-03-27 DIAGNOSIS — M48062 Spinal stenosis, lumbar region with neurogenic claudication: Secondary | ICD-10-CM | POA: Diagnosis not present

## 2023-03-27 DIAGNOSIS — M6281 Muscle weakness (generalized): Secondary | ICD-10-CM | POA: Diagnosis not present

## 2023-03-27 DIAGNOSIS — R2689 Other abnormalities of gait and mobility: Secondary | ICD-10-CM | POA: Diagnosis not present

## 2023-04-08 DIAGNOSIS — M6281 Muscle weakness (generalized): Secondary | ICD-10-CM | POA: Diagnosis not present

## 2023-04-08 DIAGNOSIS — M48062 Spinal stenosis, lumbar region with neurogenic claudication: Secondary | ICD-10-CM | POA: Diagnosis not present

## 2023-04-08 DIAGNOSIS — R2689 Other abnormalities of gait and mobility: Secondary | ICD-10-CM | POA: Diagnosis not present

## 2023-04-11 DIAGNOSIS — M48062 Spinal stenosis, lumbar region with neurogenic claudication: Secondary | ICD-10-CM | POA: Diagnosis not present

## 2023-04-11 DIAGNOSIS — R2689 Other abnormalities of gait and mobility: Secondary | ICD-10-CM | POA: Diagnosis not present

## 2023-04-11 DIAGNOSIS — M6281 Muscle weakness (generalized): Secondary | ICD-10-CM | POA: Diagnosis not present

## 2023-04-15 DIAGNOSIS — M6281 Muscle weakness (generalized): Secondary | ICD-10-CM | POA: Diagnosis not present

## 2023-04-15 DIAGNOSIS — R2689 Other abnormalities of gait and mobility: Secondary | ICD-10-CM | POA: Diagnosis not present

## 2023-04-15 DIAGNOSIS — M48062 Spinal stenosis, lumbar region with neurogenic claudication: Secondary | ICD-10-CM | POA: Diagnosis not present

## 2023-04-17 DIAGNOSIS — H353211 Exudative age-related macular degeneration, right eye, with active choroidal neovascularization: Secondary | ICD-10-CM | POA: Diagnosis not present

## 2023-04-23 DIAGNOSIS — R2689 Other abnormalities of gait and mobility: Secondary | ICD-10-CM | POA: Diagnosis not present

## 2023-04-23 DIAGNOSIS — M6281 Muscle weakness (generalized): Secondary | ICD-10-CM | POA: Diagnosis not present

## 2023-04-23 DIAGNOSIS — M48062 Spinal stenosis, lumbar region with neurogenic claudication: Secondary | ICD-10-CM | POA: Diagnosis not present

## 2023-04-29 DIAGNOSIS — R2689 Other abnormalities of gait and mobility: Secondary | ICD-10-CM | POA: Diagnosis not present

## 2023-04-29 DIAGNOSIS — M6281 Muscle weakness (generalized): Secondary | ICD-10-CM | POA: Diagnosis not present

## 2023-04-29 DIAGNOSIS — M48062 Spinal stenosis, lumbar region with neurogenic claudication: Secondary | ICD-10-CM | POA: Diagnosis not present

## 2023-05-02 DIAGNOSIS — M48062 Spinal stenosis, lumbar region with neurogenic claudication: Secondary | ICD-10-CM | POA: Diagnosis not present

## 2023-05-02 DIAGNOSIS — R2689 Other abnormalities of gait and mobility: Secondary | ICD-10-CM | POA: Diagnosis not present

## 2023-05-02 DIAGNOSIS — M6281 Muscle weakness (generalized): Secondary | ICD-10-CM | POA: Diagnosis not present

## 2023-05-06 DIAGNOSIS — M48062 Spinal stenosis, lumbar region with neurogenic claudication: Secondary | ICD-10-CM | POA: Diagnosis not present

## 2023-05-06 DIAGNOSIS — R2689 Other abnormalities of gait and mobility: Secondary | ICD-10-CM | POA: Diagnosis not present

## 2023-05-06 DIAGNOSIS — M6281 Muscle weakness (generalized): Secondary | ICD-10-CM | POA: Diagnosis not present

## 2023-05-09 DIAGNOSIS — M6281 Muscle weakness (generalized): Secondary | ICD-10-CM | POA: Diagnosis not present

## 2023-05-09 DIAGNOSIS — M48062 Spinal stenosis, lumbar region with neurogenic claudication: Secondary | ICD-10-CM | POA: Diagnosis not present

## 2023-05-09 DIAGNOSIS — R2689 Other abnormalities of gait and mobility: Secondary | ICD-10-CM | POA: Diagnosis not present

## 2023-05-23 DIAGNOSIS — Z6826 Body mass index (BMI) 26.0-26.9, adult: Secondary | ICD-10-CM | POA: Diagnosis not present

## 2023-05-23 DIAGNOSIS — I10 Essential (primary) hypertension: Secondary | ICD-10-CM | POA: Diagnosis not present

## 2023-05-23 DIAGNOSIS — Z1339 Encounter for screening examination for other mental health and behavioral disorders: Secondary | ICD-10-CM | POA: Diagnosis not present

## 2023-05-23 DIAGNOSIS — Z79899 Other long term (current) drug therapy: Secondary | ICD-10-CM | POA: Diagnosis not present

## 2023-05-23 DIAGNOSIS — E785 Hyperlipidemia, unspecified: Secondary | ICD-10-CM | POA: Diagnosis not present

## 2023-05-23 DIAGNOSIS — Z9181 History of falling: Secondary | ICD-10-CM | POA: Diagnosis not present

## 2023-05-23 DIAGNOSIS — Z1331 Encounter for screening for depression: Secondary | ICD-10-CM | POA: Diagnosis not present

## 2023-05-23 DIAGNOSIS — R7989 Other specified abnormal findings of blood chemistry: Secondary | ICD-10-CM | POA: Diagnosis not present

## 2023-05-23 DIAGNOSIS — D510 Vitamin B12 deficiency anemia due to intrinsic factor deficiency: Secondary | ICD-10-CM | POA: Diagnosis not present

## 2023-05-23 DIAGNOSIS — Z0000 Encounter for general adult medical examination without abnormal findings: Secondary | ICD-10-CM | POA: Diagnosis not present

## 2023-05-23 DIAGNOSIS — I25118 Atherosclerotic heart disease of native coronary artery with other forms of angina pectoris: Secondary | ICD-10-CM | POA: Diagnosis not present

## 2023-05-23 DIAGNOSIS — G5603 Carpal tunnel syndrome, bilateral upper limbs: Secondary | ICD-10-CM | POA: Diagnosis not present

## 2023-05-23 LAB — LAB REPORT - SCANNED: EGFR: 51

## 2023-06-12 DIAGNOSIS — H353211 Exudative age-related macular degeneration, right eye, with active choroidal neovascularization: Secondary | ICD-10-CM | POA: Diagnosis not present

## 2023-08-07 DIAGNOSIS — H353211 Exudative age-related macular degeneration, right eye, with active choroidal neovascularization: Secondary | ICD-10-CM | POA: Diagnosis not present

## 2023-09-23 ENCOUNTER — Other Ambulatory Visit: Payer: Self-pay | Admitting: Cardiology

## 2023-09-23 NOTE — Telephone Encounter (Signed)
 Prescription refill request for Eliquis  received. Indication:pe Last office visit:needs appt Scr:1.44  6/24 Age: 89 Weight:84.4  kg  Prescription refilled

## 2023-10-02 DIAGNOSIS — H353211 Exudative age-related macular degeneration, right eye, with active choroidal neovascularization: Secondary | ICD-10-CM | POA: Diagnosis not present

## 2023-10-13 NOTE — Progress Notes (Unsigned)
 Cardiology Office Note:  .   Date:  10/14/2023  ID:  Ricardo Soto, DOB 1931/11/15, MRN [id number] PCP: Ricardo Soto LABOR, MD  Ricardo Soto Providers Cardiologist:  Ricardo Shallow, MD  History of Present Illness: .   Ricardo Soto is a 89 y.o. male  with PMHx of CAD (s/p bare-metal stent to his LAD in 03/2005, Normal myoview  in 11/2011), CKD stage III s/p Nephrectomy in 2005, hypertension, hyperlipidemia, PE (03/2015), DVT (03/2015) who reports to [facility]ardo Soto office for follow up.   Last seen in [facility] via virtual visit 09/16/2022 for L3-4 lumbar laminectomy preop CV clearance.  Reported stable home BP, pulse and O2.  Also noted going to physical therapy for excellent breast benefit on MWF X 1 hour and attending chair exercises events in his community.  Noted 6.6% RCRI and 5.07 METS, therefore considered high risk for perioperative complications.  No further CV testing required and patient cleared for surgery.  Today, reports worsening chronic LE edema x 2-3 months previous related to MSK but now not relieved by compression or leg elevation. Denies CP, SOB, orthopnea, PND, palpitations, syncope. Reports compliance with medications. Reports low sodium diet  < 2g daily but occasionally eat chips and pretzel.  Able to walk with walker approx 100 yards without any exertional symptoms. Denies tobacco use/Binging ETOH/drug use. Denies any hospitalizations or visits to the emergency department.   ROS: 10 point review of system has been reviewed and considered negative except ones been listed in the HPI.   Studies Reviewed: SABRA   EKG Interpretation Date/Time:  Tuesday October 14 2023 14:27:20 EDT Ventricular Rate:  70 PR Interval:  142 QRS Duration:  70 QT Interval:  354 QTC Calculation: 382 R Axis:   26  Text Interpretation: Sinus rhythm with marked sinus arrhythmia When compared with ECG of 02-Dec-2011 10:50, No significant change was found Confirmed by Ricardo Soto ([id number]) on  10/14/2023 2:36:29 PM   ECHO 03/2015 Study Conclusions  - Left ventricle: The cavity size was below normal. Wall thickness    was normal. Systolic function was mildly reduced. The estimated    ejection fraction was in the range of 45% to 50%. Diffuse    hypokinesis. Doppler parameters are consistent with abnormal left    ventricular relaxation (grade 1 diastolic dysfunction).  - Ventricular septum: D-shaped interventricular septum suggestive    of RV pressure/volume overload.  - Aortic valve: There was no stenosis.  - Mitral valve: There was no significant regurgitation.  - Right ventricle: The cavity size was severely dilated. Systolic    function was moderately reduced.  - Right atrium: The atrium was moderately dilated.  - Atrial septum: The septum bowed from right to left, consistent    with increased right atrial pressure.  - Tricuspid valve: Peak RV-RA gradient (S): 40 mm Hg.  - Pulmonary arteries: PA peak pressure: 55 mm Hg (S).  - Systemic veins: IVC measured 2.5 cm with < 50% respirophasic    variation, suggesting RA pressure 15 mmHg.   Physical Exam:   VS:  BP 122/78 (BP Location: Left Arm, Patient Position: Sitting)   Pulse 70   Wt 194 lb 12.8 oz (88.4 kg)   SpO2 97%   BMI 27.17 kg/m    Wt Readings from Last 3 Encounters:  10/14/23 194 lb 12.8 oz (88.4 kg)  10/04/22 186 lb (84.4 kg)  09/25/22 192 lb 6.4 oz (87.3 kg)    GEN: Well nourished, well developed in no acute distress while  sitting in chair.  NECK: No JVD; No carotid bruits CARDIAC: RRR, no murmurs, rubs, gallops RESPIRATORY:  Clear to auscultation without rales, wheezing or rhonchi  ABDOMEN: Soft, non-tender, non-distended EXTREMITIES:  +1 pitting edema bilaterally; No deformity   ASSESSMENT AND PLAN: .   LE edema  ECHO 2016 in setting of PE: EF 45-50%, diffuse hypokinesis, G1DD, RV overload, severely dilated RV, RA moderately dilated, moderate pulm HTN Reports worsening chronic LE edema x 2-3  months  previous related to MSK but now not relieved by compression or leg elevation. Continue with compression socks and leg elevation.  Order CMP for today and follow up CMP in 1 week.  Order Lasix  20 mg x 3 days.   Order ECHO.   Discussed daily adequate hydration. Recommended < 2 g salt daily.  Patient reports only having 1 kidney. Per care everywhere, [facility] surgical history includes Nephrectomy in 2005. Would avoid daily nephrogenic medications. He does not follow with nephrology. Recommended nephrology referral but patient declines unless Cr remains elevated on CMP labs.   CAD HLD s/p bare-metal stent to his LAD in 03/2005, Normal myoview  in 11/2011 Denies any exertional chest pain.  No need for further ischemic evaluation at this time.  Last LDL 75 in 2016 and AST/ALT WNL in 2024. Request labs from PCP  for FLP.  Continue on Crestor  10 mg daily, Toprol -XL 25 mg daily  HTN BP well-controlled in OV: 122/78 Continue home Toprol -XL 25 mg daily  History of  PE/DVT Patient had PE in December 2016. Echocardiogram at the time showed EF 45 to 50%, diffuse hypokinesis, grade 1 DD, D-shaped interventricular septum suggestive of RV pressure volume overload, severely dilated RV, moderately dilated right atrium.  A VQ scan revealed intermediate probability for PE which explains his troponin elevation. Venous Doppler consistent with acute DVT in the right gastrocnemius, left femoral vein and the left popliteal vein, left posterior tibial vein and left gastrocnemius.  Currently on Eliquis  2.5 mg BID. This is not the appropriate dose with improved Cr 1.44 in 09/2023. Explained the age, weight and creatine parameters. Discussed increasing to appropriate dose Eliquis  to 5 mg BID, however patient would prefer to wait until labs results since Cr is usually elevated.    CKD stage III s/p Nephrectomy in 2005  Dispo: Follow up with me 12/04/2023.  Signed, Ricardo CINDERELLA Kapur, PA-C

## 2023-10-14 ENCOUNTER — Ambulatory Visit: Attending: General Practice | Admitting: Physician Assistant

## 2023-10-14 ENCOUNTER — Encounter: Payer: Self-pay | Admitting: General Practice

## 2023-10-14 VITALS — BP 122/78 | HR 70 | Wt 194.8 lb

## 2023-10-14 DIAGNOSIS — E785 Hyperlipidemia, unspecified: Secondary | ICD-10-CM

## 2023-10-14 DIAGNOSIS — I10 Essential (primary) hypertension: Secondary | ICD-10-CM

## 2023-10-14 DIAGNOSIS — I2699 Other pulmonary embolism without acute cor pulmonale: Secondary | ICD-10-CM

## 2023-10-14 DIAGNOSIS — I2510 Atherosclerotic heart disease of native coronary artery without angina pectoris: Secondary | ICD-10-CM

## 2023-10-14 DIAGNOSIS — R600 Localized edema: Secondary | ICD-10-CM

## 2023-10-14 DIAGNOSIS — N183 Chronic kidney disease, stage 3 unspecified: Secondary | ICD-10-CM

## 2023-10-14 MED ORDER — FUROSEMIDE 20 MG PO TABS: ORAL_TABLET | ORAL | 1 refills | Status: DC

## 2023-10-14 NOTE — Patient Instructions (Signed)
 Medication Instructions:  Start Lasix  20 mg once daily for 3 days *If you need a refill on your cardiac medications before your next appointment, please call your pharmacy*  Lab Work: CMP today CMP in 7 days If you have labs (blood work) drawn today and your tests are completely normal, you will receive your results only by: MyChart Message (if you have MyChart) OR A paper copy in the mail If you have any lab test that is abnormal or we need to change your treatment, we will call you to review the results.  Testing/Procedures: Echo Your physician has requested that you have an echocardiogram. Echocardiography is a painless test that uses sound waves to create images of your heart. It provides your doctor with information about the size and shape of your heart and how well your heart's chambers and valves are working. This procedure takes approximately one hour. There are no restrictions for this procedure. Please do NOT wear cologne, perfume, aftershave, or lotions (deodorant is allowed). Please arrive 15 minutes prior to your appointment time.  Please note: We ask at that you not bring children with you during ultrasound (echo/ vascular) testing. Due to room size and safety concerns, children are not allowed in the ultrasound rooms during exams. Our front office staff cannot provide observation of children in our lobby area while testing is being conducted. An adult accompanying a patient to their appointment will only be allowed in the ultrasound room at the discretion of the ultrasound technician under special circumstances. We apologize for any inconvenience.   Follow-Up: At [facility], you and your health needs are our priority.  As part of our continuing mission to provide you with exceptional heart care, our providers are all part of one team.  This team includes your primary Cardiologist (physician) and Advanced Practice Providers or APPs (Physician Assistants and Nurse  Practitioners) who all work together to provide you with the care you need, when you need it.  Your next appointment:   As scheduled 12/04/23 at 3:35pm with Albino Beauvais, NP  We recommend signing up for the patient portal called MyChart.  Sign up information is provided on this After Visit Summary.  MyChart is used to connect with patients for Virtual Visits (Telemedicine).  Patients are able to view lab/test results, encounter notes, upcoming appointments, etc.  Non-urgent messages can be sent to your provider as well.   To learn more about what you can do with MyChart, go to [email or web address].   Other Instructions Continue hydration

## 2023-10-15 ENCOUNTER — Ambulatory Visit: Payer: Self-pay | Admitting: Physician Assistant

## 2023-10-15 DIAGNOSIS — N183 Chronic kidney disease, stage 3 unspecified: Secondary | ICD-10-CM

## 2023-10-15 DIAGNOSIS — I10 Essential (primary) hypertension: Secondary | ICD-10-CM

## 2023-10-15 DIAGNOSIS — I2510 Atherosclerotic heart disease of native coronary artery without angina pectoris: Secondary | ICD-10-CM

## 2023-10-15 LAB — COMPREHENSIVE METABOLIC PANEL WITH GFR
ALT: 18 IU/L (ref 0–44)
AST: 25 IU/L (ref 0–40)
Albumin: 4.5 g/dL (ref 3.7–4.7)
Alkaline Phosphatase: 68 IU/L (ref 44–121)
BUN/Creatinine Ratio: 23 (ref 10–24)
BUN: 31 mg/dL — ABNORMAL HIGH (ref 8–27)
Bilirubin Total: 1 mg/dL (ref 0.0–1.2)
CO2: 22 mmol/L (ref 20–29)
Calcium: 10 mg/dL (ref 8.6–10.2)
Chloride: 104 mmol/L (ref 96–106)
Creatinine, Ser: 1.34 mg/dL — ABNORMAL HIGH (ref 0.76–1.27)
Globulin, Total: 2.4 g/dL (ref 1.5–4.5)
Glucose: 83 mg/dL (ref 70–99)
Potassium: 5.5 mmol/L — ABNORMAL HIGH (ref 3.5–5.2)
Sodium: 140 mmol/L (ref 134–144)
Total Protein: 6.9 g/dL (ref 6.0–8.5)
eGFR: 51 mL/min/1.73 — ABNORMAL LOW (ref >59)

## 2023-10-21 ENCOUNTER — Other Ambulatory Visit: Payer: Self-pay

## 2023-10-21 ENCOUNTER — Other Ambulatory Visit: Payer: Self-pay | Admitting: Cardiology

## 2023-10-21 DIAGNOSIS — I2510 Atherosclerotic heart disease of native coronary artery without angina pectoris: Secondary | ICD-10-CM

## 2023-10-21 DIAGNOSIS — R600 Localized edema: Secondary | ICD-10-CM

## 2023-10-21 NOTE — Telephone Encounter (Signed)
 Prescription refill request for Eliquis  received. Indication:pe Last office visit:7/25 Scr:1.34  7/25 Age: 89 Weight:88.4  kg  Prescription refilled

## 2023-10-22 LAB — COMPREHENSIVE METABOLIC PANEL WITH GFR
ALT: 16 IU/L (ref 0–44)
AST: 21 IU/L (ref 0–40)
Albumin: 4.2 g/dL (ref 3.7–4.7)
Alkaline Phosphatase: 62 IU/L (ref 44–121)
BUN/Creatinine Ratio: 25 — ABNORMAL HIGH (ref 10–24)
BUN: 36 mg/dL — ABNORMAL HIGH (ref 8–27)
Bilirubin Total: 0.9 mg/dL (ref 0.0–1.2)
CO2: 21 mmol/L (ref 20–29)
Calcium: 9.7 mg/dL (ref 8.6–10.2)
Chloride: 103 mmol/L (ref 96–106)
Creatinine, Ser: 1.44 mg/dL — ABNORMAL HIGH (ref 0.76–1.27)
Globulin, Total: 2.3 g/dL (ref 1.5–4.5)
Glucose: 84 mg/dL (ref 70–99)
Potassium: 4.8 mmol/L (ref 3.5–5.2)
Sodium: 139 mmol/L (ref 134–144)
Total Protein: 6.5 g/dL (ref 6.0–8.5)
eGFR: 46 mL/min/1.73 — ABNORMAL LOW (ref >59)

## 2023-10-24 ENCOUNTER — Encounter: Payer: Self-pay | Admitting: Advanced Practice Midwife

## 2023-11-07 ENCOUNTER — Other Ambulatory Visit: Payer: Self-pay | Admitting: General Practice

## 2023-11-24 ENCOUNTER — Ambulatory Visit (HOSPITAL_COMMUNITY)
Admission: RE | Admit: 2023-11-24 | Discharge: 2023-11-24 | Disposition: A | Discharge status: Home / Self Care | Source: Ambulatory Visit | Attending: General Practice | Admitting: General Practice

## 2023-11-24 DIAGNOSIS — I2510 Atherosclerotic heart disease of native coronary artery without angina pectoris: Secondary | ICD-10-CM | POA: Diagnosis not present

## 2023-11-24 DIAGNOSIS — I10 Essential (primary) hypertension: Secondary | ICD-10-CM | POA: Diagnosis not present

## 2023-11-24 DIAGNOSIS — N183 Chronic kidney disease, stage 3 unspecified: Secondary | ICD-10-CM | POA: Diagnosis not present

## 2023-11-24 DIAGNOSIS — R600 Localized edema: Secondary | ICD-10-CM | POA: Insufficient documentation

## 2023-11-24 LAB — BASIC METABOLIC PANEL WITH GFR
BUN/Creatinine Ratio: 24 (ref 10–24)
BUN: 36 mg/dL — ABNORMAL HIGH (ref 8–27)
CO2: 21 mmol/L (ref 20–29)
Calcium: 9.7 mg/dL (ref 8.6–10.2)
Chloride: 102 mmol/L (ref 96–106)
Creatinine, Ser: 1.51 mg/dL — ABNORMAL HIGH (ref 0.76–1.27)
Glucose: 87 mg/dL (ref 70–99)
Potassium: 4.9 mmol/L (ref 3.5–5.2)
Sodium: 135 mmol/L (ref 134–144)
eGFR: 44 mL/min/1.73 — ABNORMAL LOW (ref >59)

## 2023-11-24 LAB — ECHOCARDIOGRAM COMPLETE
Area-P 1/2: 2.69 cm2
S' Lateral: 3.19 cm

## 2023-11-27 DIAGNOSIS — H353211 Exudative age-related macular degeneration, right eye, with active choroidal neovascularization: Secondary | ICD-10-CM | POA: Diagnosis not present

## 2023-12-02 DIAGNOSIS — N183 Chronic kidney disease, stage 3 unspecified: Secondary | ICD-10-CM | POA: Diagnosis not present

## 2023-12-03 LAB — BASIC METABOLIC PANEL WITH GFR
BUN/Creatinine Ratio: 27 — ABNORMAL HIGH (ref 10–24)
BUN: 35 mg/dL — ABNORMAL HIGH (ref 8–27)
CO2: 22 mmol/L (ref 20–29)
Calcium: 10.1 mg/dL (ref 8.6–10.2)
Chloride: 99 mmol/L (ref 96–106)
Creatinine, Ser: 1.3 mg/dL — ABNORMAL HIGH (ref 0.76–1.27)
Glucose: 94 mg/dL (ref 70–99)
Potassium: 4.8 mmol/L (ref 3.5–5.2)
Sodium: 136 mmol/L (ref 134–144)
eGFR: 53 mL/min/1.73 — ABNORMAL LOW (ref >59)

## 2023-12-04 ENCOUNTER — Encounter: Payer: Self-pay | Admitting: General Practice

## 2023-12-04 ENCOUNTER — Ambulatory Visit: Attending: Cardiology | Admitting: Physician Assistant

## 2023-12-04 VITALS — BP 130/70 | HR 78 | Ht 71.0 in | Wt 197.0 lb

## 2023-12-04 DIAGNOSIS — Z86711 Personal history of pulmonary embolism: Secondary | ICD-10-CM | POA: Diagnosis not present

## 2023-12-04 DIAGNOSIS — I2510 Atherosclerotic heart disease of native coronary artery without angina pectoris: Secondary | ICD-10-CM | POA: Diagnosis not present

## 2023-12-04 DIAGNOSIS — N183 Chronic kidney disease, stage 3 unspecified: Secondary | ICD-10-CM | POA: Diagnosis not present

## 2023-12-04 DIAGNOSIS — R600 Localized edema: Secondary | ICD-10-CM

## 2023-12-04 DIAGNOSIS — Z86718 Personal history of other venous thrombosis and embolism: Secondary | ICD-10-CM | POA: Diagnosis not present

## 2023-12-04 DIAGNOSIS — I10 Essential (primary) hypertension: Secondary | ICD-10-CM | POA: Diagnosis not present

## 2023-12-04 DIAGNOSIS — E785 Hyperlipidemia, unspecified: Secondary | ICD-10-CM | POA: Diagnosis not present

## 2023-12-04 MED ORDER — APIXABAN 2.5 MG PO TABS: 2.5000 mg | ORAL_TABLET | Freq: Two times a day (BID) | ORAL | 3 refills | Status: AC

## 2023-12-04 MED ORDER — FUROSEMIDE 20 MG PO TABS: 20.0000 mg | ORAL_TABLET | ORAL | 1 refills | Status: AC

## 2023-12-04 NOTE — Progress Notes (Addendum)
 Cardiology Office Note:  .   Date:  12/04/2023  ID:  Ricardo Soto, DOB 1931/05/24, MRN [id number] PCP: Fernand Tracey LABOR, MD  [facility] Providers Cardiologist:  Redell Shallow, MD {  History of Present Illness: .   Ricardo Soto is a 89 y.o. male  with PMHx of CAD (s/p bare-metal stent to his LAD in 03/2005, Normal myoview  in 11/2011), CKD stage III s/p Nephrectomy in 2005, hypertension, hyperlipidemia, PE (03/2015), DVT (03/2015)  who reports to [facility] office for follow up.   Last seen in heartcare OV 10/14/2023 by myself. Reported worsening chronic LE edema previous related to MSK but now not relieved by compression or leg elevation. Noted some occasional high sodium snacks.  Otherwise doing well from cardiac standpoint with no other complaints. Ordered Lasix  20 mg x 3 days. CMP 7/8 showed elevated but improved Cr 1.34 and K elevated 5.5. Increased Eliquis  to 5 mg BID since Cr < 1.5. Follow up CMP 10/21/2023 showed increase but stable Cr 1.44 and K WNL. Recommended if LE improve may use Lasix  20 mg PRN and if not improved may use lasix  20 mg x 3 days. Follow up CMP 11/24/2023 showed increase but stable Cr 1.51.Recommended increasing oral hydration and reduce Lasix  to PRN and limit to x1 per week. Follow up BMP 12/02/2023 showed improvement in Cr to 1.3.   Follow up ECHO 11/24/2023 showed EF 55-60%, G1DD, mild MV regurgitation.   Today, reports significant improvement in lower extremity edema but notes left leg will never be normal due to history of dropfoot and nerve damage.  He only wears compression stockings on the left leg occasionally.  Denies chest pain, palpitations, dizziness, shortness of breath, syncope and active bleeding.  Endorses low-sodium diet.  Patient suspects that he may be drinking too much fluid daily. He lives in retirement community and is able to care and cook for himself.  He participates in sedentary activities with neighbors.  Sometimes he uses a walker  to assist with ambulation. Reports compliance with medications, however self reduced his Eliquis  to 2.5 mg twice daily last week due to concerns of kidney damage recommendation from Dr. Shallow.  He is only taking Lasix  as 20 mg  x1 per week.   ROS: 10 point review of system has been reviewed and considered negative except ones been listed in the HPI.   Studies Reviewed: Ricardo Soto   ECHO 11/2023 IMPRESSIONS   1. Left ventricular ejection fraction, by estimation, is 55 to 60%. The  left ventricle has normal function. The left ventricle has no regional wall motion abnormalities. Left ventricular diastolic parameters are consistent with Grade I diastolic  dysfunction (impaired relaxation). The average left ventricular global longitudinal strain is -18.9 %. The global longitudinal strain is normal.   2. Right ventricular systolic function is normal. The right ventricular size is normal.   3. The mitral valve is normal in structure. Mild mitral valve  regurgitation.   4. The aortic valve is tricuspid. Aortic valve regurgitation is not visualized. No aortic stenosis is present.    Physical Exam:   VS:  BP 130/70   Pulse 78   Ht 5' 11 (1.803 m)   Wt 197 lb (89.4 kg)   SpO2 96%   BMI 27.48 kg/m    Wt Readings from Last 3 Encounters:  12/04/23 197 lb (89.4 kg)  10/14/23 194 lb 12.8 oz (88.4 kg)  10/04/22 186 lb (84.4 kg)    GEN: Well nourished, well developed in  no acute distress while sitting in chair.  NECK: No JVD; No carotid bruits CARDIAC: RRR, no murmurs, rubs, gallops RESPIRATORY:  Clear to auscultation without rales, wheezing or rhonchi  ABDOMEN: Soft, non-tender, non-distended EXTREMITIES:  trace to 1+ pitting edema bilaterally; No deformity   ASSESSMENT AND PLAN: .    LE edema  ECHO 2016 in setting of PE: EF 45-50%, diffuse hypokinesis, G1DD, RV overload, severely dilated RV, RA moderately dilated, moderate pulm HTN ECHO 11/24/2023 showed EF 55-60%, G1DD, mild MV  regurgitation. reports significant improvement in lower extremity edema but notes left leg will never be normal due to history of dropfoot and nerve damage. Physical exam shows trace to 1+ pitting edema bilaterally. Encourage bilateral compression socks every night and leg elevation when sedentary. Patient agrees to increase Lasix  to x2 per week.  Ordered BMP in 2 weeks.  Discussed daily adequate hydration < 60oz of fluids. Recommended < 2 g salt daily.  Patient reports only having 1 kidney. Per care everywhere, [facility] surgical history includes Nephrectomy in 2005. Would avoid daily nephrogenic medications. He does not follow with nephrology. Recommended nephrology referral but patient declines unless Cr remains elevated on CMP labs.    CAD HLD, LDL goal < 70 s/p bare-metal stent to his LAD in 03/2005, Normal myoview  in 11/2011 Denies any exertional chest pain.  No need for further ischemic evaluation at this time.  Last LDL 68 & LFT WNL in 05/2023.  Continue on Crestor  10 mg daily, Toprol -XL 25 mg daily   HTN Reports well controlled Home BP: 120's/70's BP this OV well controlled today: 130/70 Continue home Toprol -XL 25 mg daily. Encourage physical activity for 150 minutes per week and heart healthy low sodium diet. Discussed limiting sodium intake to < 2 grams daily.      History of  PE/DVT Patient had PE in December 2016. Echocardiogram at the time showed EF 45 to 50%, diffuse hypokinesis, grade 1 DD, D-shaped interventricular septum suggestive of RV pressure volume overload, severely dilated RV, moderately dilated right atrium.  A VQ scan revealed intermediate probability for PE which explains his troponin elevation. Venous Doppler consistent with acute DVT in the right gastrocnemius, left femoral vein and the left popliteal vein, left posterior tibial vein and left gastrocnemius.  Per result note from 7/9/205, reviewed increasing Eliquis  to appropriate 5 mg twice daily dose since Cr < 1.5.   Patient agreed to increase.  Today, reports self reduced his Eliquis  to 2.5 mg twice daily last week due to concerns of kidney damage with 1 kidney and previous recommendation from Dr. Pietro.  I explained the Dr. Pietro most likely recommended 2.5 mg BID when his creatinine was higher but is now below 1.5, which make 5 mg BID the appropriate dose now. Explained that Eliquis  is dosed adjusted based on certain parameters in order to avoid complications such as stroke and blood clots.  Also explained that creatinine (kidney function) is included in those parameters. Patient understands and is aware, however patient prefers to continue Eliquis  2.5 mg twice daily (dose inappropriate for age 46, weight 89 kg, Cr 1.3  in 11/2023). Will discuss with Dr. Pietro.   ADDENDUM 12/08/2023: Per Dr. Pietro, Cr hovers close to 1.5 and is occasionally higher in previous results. Recommended keeping dose at 2.5 mg BID. Also noted, he has not seen this patient since 2022. Dr. Pietro plans to discuss further in next upcoming office visit 03/10/2024.     CKD stage III s/p Nephrectomy in 2005  Dispo: Follow up in 3 months with Dr. Pietro.   Signed, Lorette CINDERELLA Kapur, PA-C

## 2023-12-04 NOTE — Patient Instructions (Addendum)
 Medication Instructions:  INCREASE Lasix  to twice weekly   *If you need a refill on your cardiac medications before your next appointment, please call your pharmacy*  Lab Work: To be completed in 2 weeks: BMP  If you have labs (blood work) drawn today and your tests are completely normal, you will receive your results only by: MyChart Message (if you have MyChart) OR A paper copy in the mail If you have any lab test that is abnormal or we need to change your treatment, we will call you to review the results.  Testing/Procedures: None ordered today.  Follow-Up: At [facility], you and your health needs are our priority.  As part of our continuing mission to provide you with exceptional heart care, our providers are all part of one team.  This team includes your primary Cardiologist (physician) and Advanced Practice Providers or APPs (Physician Assistants and Nurse Practitioners) who all work together to provide you with the care you need, when you need it.  Your next appointment:   3 month(s)  Provider:   Redell Shallow, MD

## 2023-12-05 ENCOUNTER — Encounter: Payer: Self-pay | Admitting: Physician Assistant

## 2023-12-12 DIAGNOSIS — R600 Localized edema: Secondary | ICD-10-CM | POA: Diagnosis not present

## 2023-12-13 LAB — BASIC METABOLIC PANEL WITH GFR
BUN/Creatinine Ratio: 24 (ref 10–24)
BUN: 32 mg/dL — ABNORMAL HIGH (ref 8–27)
CO2: 22 mmol/L (ref 20–29)
Calcium: 10.1 mg/dL (ref 8.6–10.2)
Chloride: 103 mmol/L (ref 96–106)
Creatinine, Ser: 1.35 mg/dL — ABNORMAL HIGH (ref 0.76–1.27)
Glucose: 75 mg/dL (ref 70–99)
Potassium: 4.9 mmol/L (ref 3.5–5.2)
Sodium: 138 mmol/L (ref 134–144)
eGFR: 50 mL/min/1.73 — ABNORMAL LOW (ref >59)

## 2023-12-15 ENCOUNTER — Ambulatory Visit: Payer: Self-pay | Admitting: Physician Assistant

## 2024-01-22 DIAGNOSIS — H353211 Exudative age-related macular degeneration, right eye, with active choroidal neovascularization: Secondary | ICD-10-CM | POA: Diagnosis not present

## 2024-02-04 DIAGNOSIS — Z23 Encounter for immunization: Secondary | ICD-10-CM | POA: Diagnosis not present

## 2024-02-04 DIAGNOSIS — Z7901 Long term (current) use of anticoagulants: Secondary | ICD-10-CM | POA: Diagnosis not present

## 2024-02-04 DIAGNOSIS — Z86711 Personal history of pulmonary embolism: Secondary | ICD-10-CM | POA: Diagnosis not present

## 2024-02-04 DIAGNOSIS — N400 Enlarged prostate without lower urinary tract symptoms: Secondary | ICD-10-CM | POA: Diagnosis not present

## 2024-02-04 DIAGNOSIS — N183 Chronic kidney disease, stage 3 unspecified: Secondary | ICD-10-CM | POA: Diagnosis not present

## 2024-02-26 NOTE — Progress Notes (Signed)
 HPI: FU CAD; history of PCI of his LAD in December 2006 with a bare-metal stent.  Abdominal ultrasound 2007 showed no aneurysm. Myoview  8/13 showed no scar or ischemia; EF 65%. Renal dopplers in August 2013 showed prior L nephrectomy; normal R renal artery and exophytic R renal cyst. Had PE 12/16; echo revealed ejection fraction 45-50% with diffuse hypokinesis, grade 1 diastolic dysfunction; D-shaped interventricular ventricular septum suggestive of increased RV pressure and volume overload; Right ventricle was severely dilated; Right atrium was moderately dilated.  He underwent VQ scan and lower extremity venous Dopplers. V/Q scan revealed intermediate probability for pulmonary embolism. Venous Dopplers was consistent with acute deep DVT in the right gastrocnemus, vein left femoral vein and left popliteal vein, left posterior tibial vein, left peroneal vein, and left gastrocnemus. He is being treated with apixaban .  Echo repeated August 2025 and showed normal LV function, grade 1 diastolic dysfunction, mild mitral regurgitation.  Since I last saw him, he denies dyspnea, chest pain, palpitations or syncope.  No bleeding.  He has chronic pedal edema.  Current Outpatient Medications  Medication Sig Dispense Refill   acetaminophen  (TYLENOL ) 500 MG tablet Take 500 mg by mouth every 6 (six) hours as needed (pain).     apixaban  (ELIQUIS ) 2.5 MG TABS tablet Take 1 tablet (2.5 mg total) by mouth 2 (two) times daily. 90 tablet 3   Cholecalciferol  (VITAMIN D3) 50 MCG (2000 UT) TABS Take 2,000 Units by mouth daily.     Coenzyme Q10-Fish Oil -Vit E (CO-Q 10 OMEGA-3 FISH OIL  PO) Take 2 capsules by mouth daily.     folic acid  (FOLVITE ) 1 MG tablet Take 1 mg by mouth every evening.     furosemide  (LASIX ) 20 MG tablet Take 1 tablet (20 mg total) by mouth 2 (two) times a week. 90 tablet 1   metoprolol  succinate (TOPROL -XL) 25 MG 24 hr tablet TAKE 1 TABLET EVERY DAY 30 tablet 0   Multiple Vitamin (MULTIVITAMIN)  tablet Take 1 tablet by mouth daily.     Multiple Vitamins-Minerals (PRESERVISION AREDS 2) CAPS Take 1 capsule by mouth daily.     Polyethyl Glycol-Propyl Glycol (SYSTANE OP) Place 1 drop into both eyes daily as needed (dry eyes).     rosuvastatin  (CRESTOR ) 10 MG tablet Take 1 tablet (10 mg total) by mouth daily. 90 tablet 2   cyclobenzaprine  (FLEXERIL ) 10 MG tablet Take 1 tablet (10 mg total) by mouth 3 (three) times daily as needed for muscle spasms. 30 tablet 0   No current facility-administered medications for this visit.     Past Medical History:  Diagnosis Date   Acute pulmonary embolism (HCC) 03/14/2015   Arthritis    Osteo   BPH (benign prostatic hypertrophy)    s/p TURP   CAD (coronary artery disease)    a. 2006: s/p overlapping BMS-LAD 03/2005.otherwise had normal Cx and nonobstructive disease in RCA (30-40% multiple discrete lesions prox RCa, 50% mRCA). b. Neg nuc 2010.   Cancer (HCC)    skin cancer removed on face   CKD (chronic kidney disease), stage III (HCC)    DVT (deep venous thrombosis) (HCC) 03/14/2015   BLE DVT   Essential hypertension    History of kidney stones    HLD (hyperlipidemia)    Kidney stone    Neuromuscular disorder (HCC)    Left leg numbness with foot drop   Pneumonia     Past Surgical History:  Procedure Laterality Date   APPENDECTOMY  04/08/1936  CARDIAC CATHETERIZATION  04/08/2004   2 sents in LAD   CATARACT EXTRACTION  1/01 and '04   CORONARY ANGIOPLASTY     EYE SURGERY     CATARACT   HERNIA REPAIR  03/09/1999   HIP ARTHROPLASTY  04/08/2008   Left   JOINT REPLACEMENT     left hip; bilateral knee   KNEE ARTHROPLASTY  04/08/2013   Right   left knee total arthroplasty  11/25/2005   left nephrectomy  04/09/2003   at [facility] removed for begnin tumor   LUMBAR LAMINECTOMY/DECOMPRESSION MICRODISCECTOMY  12/05/2011   Procedure: LUMBAR LAMINECTOMY/DECOMPRESSION MICRODISCECTOMY 1 LEVEL;  Surgeon: Arley SHAUNNA Helling, MD;  Location: [facility];  Service: Neurosurgery;  Laterality: Bilateral;  lumbar four - five   LUMBAR LAMINECTOMY/DECOMPRESSION MICRODISCECTOMY  04/03/2012   Procedure: LUMBAR LAMINECTOMY/DECOMPRESSION MICRODISCECTOMY 1 LEVEL;  Surgeon: Arley SHAUNNA Helling, MD;  Location: [facility];  Service: Neurosurgery;  Laterality: Left;  left lumbar four-five laminectony diskectomy   LUMBAR LAMINECTOMY/DECOMPRESSION MICRODISCECTOMY Bilateral 10/04/2022   Procedure: Bilateral - Lumbar three-Lumbar four- laminectomy and foraminotomy;  Surgeon: Helling Arley, MD;  Location: [facility] OR;  Service: Neurosurgery;  Laterality: Bilateral;   TRANSURETHRAL RESECTION OF PROSTATE  04/08/2002    Social History   Socioeconomic History   Marital status: Widowed    Spouse name: Not on file   Number of children: Not on file   Years of education: Not on file   Highest education level: Not on file  Occupational History   Not on file  Tobacco Use   Smoking status: Former    Current packs/day: 0.00    Types: Cigarettes    Start date: 11/06/1957    Quit date: 11/07/1982    Years since quitting: 41.3   Smokeless tobacco: Never   Tobacco comments:    1984  Vaping Use   Vaping status: Never Used  Substance and Sexual Activity   Alcohol  use: Yes    Alcohol /week: 3.0 standard drinks of alcohol     Types: 1 Glasses of wine, 2 Cans of beer per week    Comment: occassionally   Drug use: No   Sexual activity: Not Currently  Other Topics Concern   Not on file  Social History Narrative   Widowed (wife died 5 years ago); 2 children - son, ; daughter,  - both alive   Retired - [employer].    Social Drivers of Corporate Investment Banker Strain: Not on Ship Broker Insecurity: Not on file  Transportation Needs: Not on file  Physical Activity: Not on file  Stress: Not on file  Social Connections: Not on file  Intimate Partner Violence: Not on file    Family History  Problem Relation Age of Onset   Heart attack Father    Heart  disease Other        GP   Heart attack Other        GP     ROS: no fevers or chills, productive cough, hemoptysis, dysphasia, odynophagia, melena, hematochezia, dysuria, hematuria, rash, seizure activity, orthopnea, PND, pedal edema, claudication. Remaining systems are negative.  Physical Exam: Well-developed well-nourished in no acute distress.  Skin is warm and dry.  HEENT is normal.  Neck is supple.  Chest is clear to auscultation with normal expansion.  Cardiovascular exam is regular rate and rhythm.  Abdominal exam nontender or distended. No masses palpated. Extremities show 1+ ankle edema. neuro grossly intact   A/P  1  coronary artery disease-patient denies chest pain.  Continue statin.  2 history of pulmonary embolus-continue apixaban  at present dose.  3 hypertension-patient's blood pressure is controlled.  Continue present medical regimen.  4 hyperlipidemia-continue statin.  Redell Shallow, MD

## 2024-03-10 ENCOUNTER — Ambulatory Visit: Attending: Cardiology | Admitting: Cardiology

## 2024-03-10 ENCOUNTER — Encounter: Payer: Self-pay | Admitting: Cardiology

## 2024-03-10 VITALS — BP 124/76 | HR 84 | Ht 71.0 in | Wt 196.0 lb

## 2024-03-10 DIAGNOSIS — E785 Hyperlipidemia, unspecified: Secondary | ICD-10-CM

## 2024-03-10 DIAGNOSIS — I10 Essential (primary) hypertension: Secondary | ICD-10-CM

## 2024-03-10 DIAGNOSIS — Z86711 Personal history of pulmonary embolism: Secondary | ICD-10-CM | POA: Diagnosis not present

## 2024-03-10 DIAGNOSIS — I2510 Atherosclerotic heart disease of native coronary artery without angina pectoris: Secondary | ICD-10-CM | POA: Diagnosis not present

## 2024-03-10 NOTE — Patient Instructions (Signed)
   Follow-Up: At [facility], you and your health needs are our priority.  As part of our continuing mission to provide you with exceptional heart care, our providers are all part of one team.  This team includes your primary Cardiologist (physician) and Advanced Practice Providers or APPs (Physician Assistants and Nurse Practitioners) who all work together to provide you with the care you need, when you need it.  Your next appointment:   12 month(s)  Provider:   Lamar Fitch, MD
